# Patient Record
Sex: Male | Born: 1960 | Race: White | Hispanic: No | Smoking: Current every day smoker
Health system: Southern US, Community
[De-identification: ages and names within clinical notes are randomized; demographics above are authoritative.]

## PROBLEM LIST (undated history)

## (undated) DIAGNOSIS — I10 Essential (primary) hypertension: Secondary | ICD-10-CM

## (undated) DIAGNOSIS — M109 Gout, unspecified: Secondary | ICD-10-CM

## (undated) DIAGNOSIS — K219 Gastro-esophageal reflux disease without esophagitis: Secondary | ICD-10-CM

## (undated) DIAGNOSIS — E785 Hyperlipidemia, unspecified: Secondary | ICD-10-CM

## (undated) DIAGNOSIS — IMO0002 Reserved for concepts with insufficient information to code with codable children: Secondary | ICD-10-CM

## (undated) DIAGNOSIS — M1990 Unspecified osteoarthritis, unspecified site: Secondary | ICD-10-CM

## (undated) HISTORY — DX: Hyperlipidemia, unspecified: E78.5

## (undated) HISTORY — PX: ANTERIOR CRUCIATE LIGAMENT REPAIR: SHX115

## (undated) HISTORY — PX: NECK SURGERY: SHX720

## (undated) HISTORY — DX: Gastro-esophageal reflux disease without esophagitis: K21.9

---

## 1997-08-01 ENCOUNTER — Encounter: Admission: RE | Admit: 1997-08-01 | Discharge: 1997-10-30 | Payer: Self-pay | Admitting: Specialist

## 2000-08-26 ENCOUNTER — Inpatient Hospital Stay (HOSPITAL_COMMUNITY): Admission: EM | Admit: 2000-08-26 | Discharge: 2000-08-27 | Payer: Self-pay | Admitting: Emergency Medicine

## 2000-08-26 ENCOUNTER — Encounter: Payer: Self-pay | Admitting: Orthopedic Surgery

## 2003-06-09 ENCOUNTER — Ambulatory Visit (HOSPITAL_COMMUNITY): Admission: RE | Admit: 2003-06-09 | Discharge: 2003-06-09 | Payer: Self-pay | Admitting: Family Medicine

## 2005-06-11 ENCOUNTER — Encounter: Admission: RE | Admit: 2005-06-11 | Discharge: 2005-06-11 | Payer: Self-pay | Admitting: Orthopaedic Surgery

## 2005-06-14 ENCOUNTER — Ambulatory Visit (HOSPITAL_COMMUNITY): Admission: RE | Admit: 2005-06-14 | Discharge: 2005-06-15 | Payer: Self-pay | Admitting: Orthopaedic Surgery

## 2005-10-09 ENCOUNTER — Encounter: Admission: RE | Admit: 2005-10-09 | Discharge: 2005-10-09 | Payer: Self-pay | Admitting: Orthopaedic Surgery

## 2005-10-30 ENCOUNTER — Encounter: Admission: RE | Admit: 2005-10-30 | Discharge: 2005-10-30 | Payer: Self-pay | Admitting: Orthopaedic Surgery

## 2006-03-11 ENCOUNTER — Encounter: Admission: RE | Admit: 2006-03-11 | Discharge: 2006-03-11 | Payer: Self-pay | Admitting: Orthopaedic Surgery

## 2006-10-02 ENCOUNTER — Encounter: Admission: RE | Admit: 2006-10-02 | Discharge: 2006-10-02 | Payer: Self-pay | Admitting: Orthopaedic Surgery

## 2008-01-16 ENCOUNTER — Emergency Department (HOSPITAL_COMMUNITY): Admission: EM | Admit: 2008-01-16 | Discharge: 2008-01-16 | Payer: Self-pay | Admitting: Emergency Medicine

## 2008-05-20 ENCOUNTER — Ambulatory Visit (HOSPITAL_COMMUNITY): Admission: RE | Admit: 2008-05-20 | Discharge: 2008-05-20 | Payer: Self-pay | Admitting: Family Medicine

## 2008-11-17 ENCOUNTER — Emergency Department (HOSPITAL_COMMUNITY): Admission: EM | Admit: 2008-11-17 | Discharge: 2008-11-18 | Payer: Self-pay | Admitting: Emergency Medicine

## 2010-04-11 LAB — DIFFERENTIAL
Basophils Absolute: 0.2 10*3/uL — ABNORMAL HIGH (ref 0.0–0.1)
Eosinophils Absolute: 0.2 10*3/uL (ref 0.0–0.7)
Eosinophils Relative: 3 % (ref 0–5)
Lymphocytes Relative: 30 % (ref 12–46)
Lymphs Abs: 1.9 10*3/uL (ref 0.7–4.0)
Neutro Abs: 3.5 10*3/uL (ref 1.7–7.7)
Neutrophils Relative %: 56 % (ref 43–77)

## 2010-04-11 LAB — CBC
Hemoglobin: 14.5 g/dL (ref 13.0–17.0)
MCV: 93 fL (ref 78.0–100.0)
RBC: 4.56 MIL/uL (ref 4.22–5.81)

## 2010-04-19 ENCOUNTER — Emergency Department (HOSPITAL_COMMUNITY)
Admission: EM | Admit: 2010-04-19 | Discharge: 2010-04-19 | Disposition: A | Discharge status: Home / Self Care | Payer: Self-pay | Attending: Emergency Medicine | Admitting: Emergency Medicine

## 2010-04-19 DIAGNOSIS — M069 Rheumatoid arthritis, unspecified: Secondary | ICD-10-CM | POA: Insufficient documentation

## 2010-04-19 DIAGNOSIS — IMO0002 Reserved for concepts with insufficient information to code with codable children: Secondary | ICD-10-CM | POA: Insufficient documentation

## 2010-04-19 DIAGNOSIS — G510 Bell's palsy: Secondary | ICD-10-CM | POA: Insufficient documentation

## 2010-05-25 NOTE — Op Note (Signed)
NAME:  Dustin [name], Dustin [name]              ACCOUNT NO.:  [id number]   MEDICAL RECORD NO.:  [id number]          PATIENT TYPE:  OIB   LOCATION:  2550                         FACILITY:  [facility]   PHYSICIAN:  Mark C. Ophelia Charter, M.D.    DATE OF BIRTH:  13-Jul-1960   DATE OF PROCEDURE:  06/14/2005  DATE OF DISCHARGE:                                 OPERATIVE REPORT   PREOPERATIVE DIAGNOSIS:  Cervical spondylosis, C5-6 and C6-7, with disk  protrusion at C4-5, left.   POSTOPERATIVE DIAGNOSIS:  Cervical spondylosis, C5-6 and C6-7, with disk  protrusion at C4-5, left.   PROCEDURE:  C6 corpectomy, C4-5, C5-6, C6-7 anterior cervical diskectomy and  fusion, fibular allograft from C5 to C7 and allograft plug at C4-5, plating.   SURGEON:  Mark C. Ophelia Charter, M.D.   ASSISTANT:  Donnie Aho, P.A.-C   ANESTHESIA:  GOT plus 5.5 mL of Marcaine 0.5% plus epinephrine local.   DRAINS:  One Hemovac.   BRIEF HISTORY:  This 50 year old male has been followed for several years  with progressive spondylosis at C5-6 and C6-7.  He has been taking Vicodin  due to pain and had disk protrusion at C4-5, taken preoperatively for our  planned procedure.  Since the disk had protruded further at C4-5,  particularly on the left, which is the symptomatic side, it was decided to  include that in the fusion for a 3-level diskectomy.  Corpectomy was planned  at C6 to have 4 levels of bone healing rather than 6 sides with typical 3-  level fusion.   DESCRIPTION OF PROCEDURE:  After induction of general anesthesia and oral  tracheal intubation an incision was made after area was squared with towels,  Betadine, Vi-Drape, sterile mounts used at the head and thyroid sheet was  used.  Incision was started in the midline and extended to the left.  The  platysma was divided in line with the fibers.  The omohyoid was spared and  the bottom level of C6-7 was exposed first and confirmed with lateral C-arm  that was draped and brought in.   There was significant spondylosis with a  combination of bone ridging and disk protrusion at this level, worse on the  left than right and operative microscope was draped and brought in.  Microdiskectomy was performed, taking down the posterior aspect of the disk  with microdissection using 1- and 2-mm rongeurs.  Kerrisons were used.  Ligament was taken down when the dura was exposed and the C6 vertebral was  removed, taking a central trough the width of the __________  allograft,  which was 12 mm.  I continued removal of bone with bone waxing as necessary,  leaving a portion of the posterior cortex with the sides intact.  Once the  C5-6 disk was encountered, using the operating microscope, diskectomy was  performed, following back to the posterior longitudinal ligament, removing  the ligament on the right and left side, leaving the central portion of the  ligament intact.  Spurs were removed.  The uncovertebral joints were  stripped with the Cloward curettes and a high-speed bur was used  to square  off the area so it exactly fit.  A piece of fibula was cut with the flat  portion sitting anteriorly.  It had to be cut a couple of times, since it  was slightly long.  It was inserted with head halter traction applied by the  CRNA and impacted into position.  A tiny crack occurred anteriorly as it was  being countersunk slightly so it would be flush with the anterior cortex and  not hanging on the spur.  There was egress of bone on each side so that the  graft of the level of the disk for fluid and a fingertip had been placed  distally in the incision down toward the mediastinum.  The operative field  was dry and some FloSeal had been sprayed on the posterior aspect of the  vertebra.  DBX putty was packed in the ends of the graft.  Traction was  released once the graft was inserted.  Self-retaining retractors were then  placed above the omohyoid at the C4-5 level.  At this level there was   protrusion and the operating microscope was used as the posterior  longitudinal ligament was exposed.  Using a black nerve hook, a tiny piece  of disk was noted, grasped with a micropituitary and a fragment popped out  from the left side; it was sitting out laterally at the shoulder of the  nerve root.  After removal of bone, stripping the uncovertebral joints, dura  was well-exposed. Sizing showed a 7-mm graft was appropriate and it was  inserted with some DBX putty in the graft.  After irrigation with saline  solution, an appropriate-length EBI [employer] spine plate was selected.  This  had the locking ring.  The 14-mm screws were inserted after confirming  position with AP and lateral fluoroscopy.  Screws were placed in C4 and C5  and then distally in C7.  There was excellent fixation.  The graft at C4  lined up with the fibular allograft at C5-7.  Hemovac was placed through a  separate stab incision with in-and-out technique.  All screws were flush  with the plate.  Platysma was closed with 3-0 Vicryl, 4-0 Vicryl  subcuticular closure, tincture of Benzoin and Steri-Strips, soft collar  after dressing and Marcaine infiltration of the skin, and transferred to the  recovery room, neurologically intact.  Instrument count and needle count was  correct.   ADDENDUM:  Fibula was sized and depth-gauge-measured as well as width for  appropriate fine-tuning so that the size would fit snugly and securely.      Mark C. Ophelia Charter, M.D.  Electronically Signed     MCY/MEDQ  D:  06/14/2005  T:  06/15/2005  Job:  [id number]

## 2012-12-24 ENCOUNTER — Encounter (HOSPITAL_COMMUNITY): Payer: Self-pay | Admitting: Emergency Medicine

## 2012-12-24 ENCOUNTER — Emergency Department (INDEPENDENT_AMBULATORY_CARE_PROVIDER_SITE_OTHER)
Admission: EM | Admit: 2012-12-24 | Discharge: 2012-12-24 | Disposition: A | Discharge status: Home / Self Care | Payer: Self-pay | Source: Home / Self Care | Attending: Family Medicine | Admitting: Family Medicine

## 2012-12-24 DIAGNOSIS — I10 Essential (primary) hypertension: Secondary | ICD-10-CM

## 2012-12-24 HISTORY — DX: Essential (primary) hypertension: I10

## 2012-12-24 HISTORY — DX: Reserved for concepts with insufficient information to code with codable children: IMO0002

## 2012-12-24 HISTORY — DX: Gout, unspecified: M10.9

## 2012-12-24 HISTORY — DX: Unspecified osteoarthritis, unspecified site: M19.90

## 2012-12-24 MED ORDER — ATENOLOL 50 MG PO TABS: 50.0000 mg | ORAL_TABLET | Freq: Every day | ORAL | Status: DC

## 2012-12-24 NOTE — ED Notes (Signed)
Patient reports he needs a refill of blood pressure medicine.  Last on medication July 2014.  Reports today he is working with Louie Boston at social services to get orange card.  Patient has been seen at [facility] in November.  Patient no longer able to go to pcp-primary pcp left practice and copay has increased

## 2012-12-24 NOTE — ED Provider Notes (Signed)
Dustin [name] is a 52 y.o. male who presents to Urgent Care today for hypertension medication refill. Patient is without health insurance and physician. He previously had hypertension that was well-controlled with atenolol 50 mg daily. He is currently in the process of being [facility] on his blood pressure medicines now. He denies any chest pains palpitations dizziness or trouble breathing.  Additionally patient has chronic neck pain he recently managed with hydrocodone and diazepam. No new issues   Past Medical History  Diagnosis Date  . Hypertension   . Arthritis   . Gout   . DDD (degenerative disc disease)    History  Substance Use Topics  . Smoking status: Current Every Day Smoker  . Smokeless tobacco: Not on file  . Alcohol Use: Yes   ROS as above Medications reviewed. No current facility-administered medications for this encounter.   Current Outpatient Prescriptions  Medication Sig Dispense Refill  . atenolol (TENORMIN) 50 MG tablet Take 1 tablet (50 mg total) by mouth daily.  30 tablet  1    Exam:  BP 155/80  Pulse 79  Temp(Src) 98.3 F (36.8 C) (Oral)  Resp 16  SpO2 99% Gen: Well NAD HEENT:  MMM Lungs: Normal work of breathing. CTABL Heart: RRR no MRG Exts: Non edematous BL  LE, warm and well perfused.    Lab work from [place] emergency room one month ago shows creatinine 1.1 potassium normal.  Assessment and Plan: 52 y.o. male with hypertension. Plan refill atenolol. Followup at the [facility]. Discussed warning signs or symptoms. Please see discharge instructions. Patient expresses understanding.      Rodolph Bong, MD 12/24/12 1136

## 2013-01-28 ENCOUNTER — Encounter: Payer: Self-pay | Admitting: Internal Medicine

## 2013-01-28 ENCOUNTER — Ambulatory Visit: Payer: No Typology Code available for payment source | Attending: Internal Medicine | Admitting: Internal Medicine

## 2013-01-28 VITALS — BP 150/111 | HR 97 | Temp 98.9°F | Resp 14 | Ht 74.0 in | Wt 263.4 lb

## 2013-01-28 DIAGNOSIS — M069 Rheumatoid arthritis, unspecified: Secondary | ICD-10-CM

## 2013-01-28 DIAGNOSIS — Z0000 Encounter for general adult medical examination without abnormal findings: Secondary | ICD-10-CM

## 2013-01-28 DIAGNOSIS — I10 Essential (primary) hypertension: Secondary | ICD-10-CM

## 2013-01-28 DIAGNOSIS — M542 Cervicalgia: Secondary | ICD-10-CM | POA: Insufficient documentation

## 2013-01-28 LAB — CBC WITH DIFFERENTIAL/PLATELET
BASOS ABS: 0.1 10*3/uL (ref 0.0–0.1)
Basophils Relative: 1 % (ref 0–1)
Eosinophils Absolute: 0.2 10*3/uL (ref 0.0–0.7)
Eosinophils Relative: 3 % (ref 0–5)
HEMATOCRIT: 46.3 % (ref 39.0–52.0)
Hemoglobin: 16.1 g/dL (ref 13.0–17.0)
Lymphocytes Relative: 29 % (ref 12–46)
Lymphs Abs: 2 10*3/uL (ref 0.7–4.0)
MCH: 30.9 pg (ref 26.0–34.0)
MCHC: 34.8 g/dL (ref 30.0–36.0)
MCV: 88.9 fL (ref 78.0–100.0)
MONO ABS: 0.7 10*3/uL (ref 0.1–1.0)
Monocytes Relative: 11 % (ref 3–12)
NEUTROS ABS: 4 10*3/uL (ref 1.7–7.7)
Neutrophils Relative %: 56 % (ref 43–77)
PLATELETS: 310 10*3/uL (ref 150–400)
RBC: 5.21 MIL/uL (ref 4.22–5.81)
RDW: 13 % (ref 11.5–15.5)
WBC: 6.9 10*3/uL (ref 4.0–10.5)

## 2013-01-28 LAB — COMPLETE METABOLIC PANEL WITH GFR
ALT: 29 U/L (ref 0–53)
AST: 21 U/L (ref 0–37)
Albumin: 4.5 g/dL (ref 3.5–5.2)
Alkaline Phosphatase: 57 U/L (ref 39–117)
BILIRUBIN TOTAL: 0.4 mg/dL (ref 0.3–1.2)
BUN: 14 mg/dL (ref 6–23)
CHLORIDE: 101 meq/L (ref 96–112)
CO2: 22 meq/L (ref 19–32)
Calcium: 9.4 mg/dL (ref 8.4–10.5)
Creat: 1.11 mg/dL (ref 0.50–1.35)
GFR, EST AFRICAN AMERICAN: 89 mL/min
GFR, Est Non African American: 77 mL/min
GLUCOSE: 130 mg/dL — AB (ref 70–99)
POTASSIUM: 4.5 meq/L (ref 3.5–5.3)
Sodium: 133 mEq/L — ABNORMAL LOW (ref 135–145)
Total Protein: 6.9 g/dL (ref 6.0–8.3)

## 2013-01-28 LAB — TSH: TSH: 0.485 u[IU]/mL (ref 0.350–4.500)

## 2013-01-28 LAB — URIC ACID: URIC ACID, SERUM: 5.3 mg/dL (ref 4.0–7.8)

## 2013-01-28 LAB — LIPID PANEL
CHOL/HDL RATIO: 3.7 ratio
CHOLESTEROL: 136 mg/dL (ref 0–200)
HDL: 37 mg/dL — AB (ref >39)
LDL Cholesterol: 57 mg/dL (ref 0–99)
Triglycerides: 210 mg/dL — ABNORMAL HIGH (ref <150)
VLDL: 42 mg/dL — AB (ref 0–40)

## 2013-01-28 LAB — RHEUMATOID FACTOR: RHEUMATOID FACTOR: 11 [IU]/mL (ref <=14)

## 2013-01-28 LAB — POCT GLYCOSYLATED HEMOGLOBIN (HGB A1C): Hemoglobin A1C: 5.1

## 2013-01-28 MED ORDER — TRAMADOL HCL 50 MG PO TABS: 50.0000 mg | ORAL_TABLET | Freq: Three times a day (TID) | ORAL | Status: DC | PRN

## 2013-01-28 MED ORDER — CYCLOBENZAPRINE HCL 10 MG PO TABS: 10.0000 mg | ORAL_TABLET | Freq: Three times a day (TID) | ORAL | Status: DC | PRN

## 2013-01-28 MED ORDER — MELOXICAM 15 MG PO TABS: 15.0000 mg | ORAL_TABLET | Freq: Every day | ORAL | Status: DC

## 2013-01-28 MED ORDER — ATENOLOL 50 MG PO TABS: 50.0000 mg | ORAL_TABLET | Freq: Every day | ORAL | Status: DC

## 2013-01-28 MED ORDER — PREDNISONE 20 MG PO TABS: 20.0000 mg | ORAL_TABLET | Freq: Every day | ORAL | Status: AC

## 2013-01-28 NOTE — Progress Notes (Signed)
Patient ID: Dustin [name], male   DOB: Dec 30, 1960, 53 y.o.   MRN: [id number]   CC:  HPI: 53 year old male here to establish care, he has multiple complaints. He states that he had surgery on the C-spine, cervical spine fusion done by Dr. Lorin Mercy several years ago. He also has chronic lumbar spinal pain. He states that he had x-rays of the C-spine and plan the hospital. Because of his insurance the patient was unable to reestablish with Dr. Lorin Mercy and would like a referral to a neurosurgeon or an orthopedic surgeon. Currently he takes Aleve and ibuprofen on a daily basis He states that his blood pressure is well controlled on atenolol.  He complains of a rash that originates from the left side of his face and disappears after applying hydrocortisone cream He also complains of multiple joint pains and needs to be screened for rheumatoid arthritis as his mother has rheumatoid arthritis He has a personal history of ulcerated colitis and had a colonoscopy in 2011 by etiology I have had 2 polyps removed. He was told that he supposed to have a colonoscopy and an annual basis   Social history Smokes half a pack a day and alcohol socially  Family history positive for lung cancer, lymph node cancer in the father Mother has rheumatoid arthritis  No Known Allergies Past Medical History  Diagnosis Date  . Hypertension   . Arthritis   . Gout   . DDD (degenerative disc disease)    No current outpatient prescriptions on file prior to visit.   No current facility-administered medications on file prior to visit.   No family history on file. History   Social History  . Marital Status: Legally Separated    Spouse Name: N/A    Number of Children: N/A  . Years of Education: N/A   Occupational History  . Not on file.   Social History Main Topics  . Smoking status: Current Every Day Smoker  . Smokeless tobacco: Not on file  . Alcohol Use: Yes  . Drug Use: No  . Sexual Activity: Not on file    Other Topics Concern  . Not on file   Social History Narrative  . No narrative on file    Review of Systems  Constitutional: As in history of present illness  HENT: Negative for ear pain, nosebleeds, congestion, facial swelling, rhinorrhea, neck pain, neck stiffness and ear discharge.   Eyes: Negative for pain, discharge, redness, itching and visual disturbance.  Respiratory: Negative for cough, choking, chest tightness, shortness of breath, wheezing and stridor.   Cardiovascular: Negative for chest pain, palpitations and leg swelling.  Gastrointestinal: Negative for abdominal distention.  Genitourinary: Negative for dysuria, urgency, frequency, hematuria, flank pain, decreased urine volume, difficulty urinating and dyspareunia.  Musculoskeletal: As in history of present illness Neurological: Negative for dizziness, tremors, seizures, syncope, facial asymmetry, speech difficulty, weakness, light-headedness, numbness and headaches.  Hematological: Negative for adenopathy. Does not bruise/bleed easily.  Psychiatric/Behavioral: Negative for hallucinations, behavioral problems, confusion, dysphoric mood, decreased concentration and agitation.    Objective:   Filed Vitals:   01/28/13 1225  BP: 150/111  Pulse: 97  Temp: 98.9 F (37.2 C)  Resp: 14    Physical Exam  Constitutional: Appears well-developed and well-nourished. No distress.  HENT: Normocephalic. External right and left ear normal. Oropharynx is clear and moist.  Eyes: Conjunctivae and EOM are normal. PERRLA, no scleral icterus.  Neck: Normal ROM. Neck supple. No JVD. No tracheal deviation. No thyromegaly.  CVS:  RRR, S1/S2 +, no murmurs, no gallops, no carotid bruit.  Pulmonary: Effort and breath sounds normal, no stridor, rhonchi, wheezes, rales.  Abdominal: Soft. BS +,  no distension, tenderness, rebound or guarding.  Musculoskeletal: Normal range of motion. No edema and no tenderness.  Lymphadenopathy: No  lymphadenopathy noted, cervical, inguinal. Neuro: Alert. Normal reflexes, muscle tone coordination. No cranial nerve deficit. Skin: Skin is warm and dry. No rash noted. Not diaphoretic. No erythema. No pallor.  Psychiatric: Normal mood and affect. Behavior, judgment, thought content normal.   Lab Results  Component Value Date   WBC 6.4 11/17/2008   HGB 14.5 11/17/2008   HCT 42.4 11/17/2008   MCV 93.0 11/17/2008   PLT 251 11/17/2008   No results found for this basename: CREATININE, BUN, NA, K, CL, CO2    No results found for this basename: HGBA1C   Lipid Panel  No results found for this basename: chol, trig, hdl, cholhdl, vldl, ldlcalc       Assessment and plan:   There are no active problems to display for this patient.  Hypertension Continue atenolol Renal panel   Neck pain Patient will be prescribed prednisone, flexible, Mobic  , tramadol Referral provided for neurosurgery MRI of the C-spine Also screen for rheumatoid arthritis   Ulcerated colitis Refer to gastroenterology for a colonoscopy  Establish care Follow the above obtain baseline labs  Follow up in 2-3 months    The patient was given clear instructions to go to ER or return to medical center if symptoms don't improve, worsen or new problems develop. The patient verbalized understanding. The patient was told to call to get any lab results if not heard anything in the next week.

## 2013-01-28 NOTE — Progress Notes (Signed)
Pt is here to establish care. Has an history of hypertension.

## 2013-01-29 ENCOUNTER — Telehealth: Payer: Self-pay | Admitting: *Deleted

## 2013-01-29 LAB — SEDIMENTATION RATE: Sed Rate: 1 mm/hr (ref 0–16)

## 2013-01-29 LAB — ANA: ANA: NEGATIVE

## 2013-01-29 LAB — CYCLIC CITRUL PEPTIDE ANTIBODY, IGG: Cyclic Citrullin Peptide Ab: <2 U/mL (ref 0.0–5.0)

## 2013-01-29 LAB — VITAMIN D 25 HYDROXY (VIT D DEFICIENCY, FRACTURES): VIT D 25 HYDROXY: 32 ng/mL (ref 30–89)

## 2013-01-29 NOTE — Telephone Encounter (Signed)
Message copied by Delesha Pohlman, [place] R on Fri Jan 29, 2013  2:34 PM ------      Message from: Allyson Sabal MD, Ascencion Dike      Created: Fri Jan 29, 2013 10:19 AM       Notify patient of the labs are normal, triglyceride mildly elevated, encourage patient to go on a low-fat diet ------

## 2013-01-29 NOTE — Telephone Encounter (Signed)
Left a voicemail for pt to give us a call back. 

## 2013-02-02 ENCOUNTER — Telehealth: Payer: Self-pay | Admitting: *Deleted

## 2013-02-02 NOTE — Telephone Encounter (Signed)
Message copied by Luie Laneve, [place] R on Tue Feb 02, 2013 12:02 PM ------      Message from: Allyson Sabal MD, Ascencion Dike      Created: Mon Feb 01, 2013  2:07 PM       Workup for rheumatoid arthritis and other inflammatory arthritis is negative ------

## 2013-02-02 NOTE — Telephone Encounter (Signed)
Left a voicemail for pt to give us a call back. 

## 2013-02-10 ENCOUNTER — Ambulatory Visit (HOSPITAL_COMMUNITY)
Admission: RE | Admit: 2013-02-10 | Discharge: 2013-02-10 | Disposition: A | Discharge status: Home / Self Care | Payer: No Typology Code available for payment source | Source: Ambulatory Visit | Attending: Internal Medicine | Admitting: Internal Medicine

## 2013-02-10 DIAGNOSIS — I10 Essential (primary) hypertension: Secondary | ICD-10-CM

## 2013-02-10 DIAGNOSIS — M4802 Spinal stenosis, cervical region: Secondary | ICD-10-CM | POA: Insufficient documentation

## 2013-02-10 DIAGNOSIS — Z981 Arthrodesis status: Secondary | ICD-10-CM | POA: Insufficient documentation

## 2013-02-10 DIAGNOSIS — M069 Rheumatoid arthritis, unspecified: Secondary | ICD-10-CM

## 2013-02-10 DIAGNOSIS — Z0000 Encounter for general adult medical examination without abnormal findings: Secondary | ICD-10-CM

## 2013-02-10 DIAGNOSIS — M542 Cervicalgia: Secondary | ICD-10-CM | POA: Insufficient documentation

## 2013-04-06 ENCOUNTER — Ambulatory Visit: Payer: No Typology Code available for payment source | Attending: Internal Medicine | Admitting: Internal Medicine

## 2013-04-06 ENCOUNTER — Encounter: Payer: Self-pay | Admitting: Internal Medicine

## 2013-04-06 VITALS — BP 147/96 | HR 82 | Temp 99.1°F | Resp 16 | Ht 74.0 in | Wt 273.0 lb

## 2013-04-06 DIAGNOSIS — Z79899 Other long term (current) drug therapy: Secondary | ICD-10-CM | POA: Insufficient documentation

## 2013-04-06 DIAGNOSIS — R519 Headache, unspecified: Secondary | ICD-10-CM | POA: Insufficient documentation

## 2013-04-06 DIAGNOSIS — K029 Dental caries, unspecified: Secondary | ICD-10-CM | POA: Insufficient documentation

## 2013-04-06 DIAGNOSIS — IMO0002 Reserved for concepts with insufficient information to code with codable children: Secondary | ICD-10-CM | POA: Insufficient documentation

## 2013-04-06 DIAGNOSIS — F17200 Nicotine dependence, unspecified, uncomplicated: Secondary | ICD-10-CM | POA: Insufficient documentation

## 2013-04-06 DIAGNOSIS — R51 Headache: Secondary | ICD-10-CM

## 2013-04-06 DIAGNOSIS — I10 Essential (primary) hypertension: Secondary | ICD-10-CM

## 2013-04-06 DIAGNOSIS — M109 Gout, unspecified: Secondary | ICD-10-CM | POA: Insufficient documentation

## 2013-04-06 MED ORDER — ACETAMINOPHEN-CODEINE #3 300-30 MG PO TABS: 1.0000 | ORAL_TABLET | ORAL | Status: DC | PRN

## 2013-04-06 MED ORDER — PENICILLIN V POTASSIUM 500 MG PO TABS: 500.0000 mg | ORAL_TABLET | Freq: Three times a day (TID) | ORAL | Status: DC

## 2013-04-06 NOTE — Patient Instructions (Signed)
Hypertension  As your heart beats, it forces blood through your arteries. This force is your blood pressure. If the pressure is too high, it is called hypertension (HTN) or high blood pressure. HTN is dangerous because you may have it and not know it. High blood pressure may mean that your heart has to work harder to pump blood. Your arteries may be narrow or stiff. The extra work puts you at risk for heart disease, stroke, and other problems.   Blood pressure consists of two numbers, a higher number over a lower, 110/72, for example. It is stated as "110 over 72." The ideal is below 120 for the top number (systolic) and under 80 for the bottom (diastolic). Write down your blood pressure today.  You should pay close attention to your blood pressure if you have certain conditions such as:   Heart failure.   Prior heart attack.   Diabetes   Chronic kidney disease.   Prior stroke.   Multiple risk factors for heart disease.  To see if you have HTN, your blood pressure should be measured while you are seated with your arm held at the level of the heart. It should be measured at least twice. A one-time elevated blood pressure reading (especially in the Emergency Department) does not mean that you need treatment. There may be conditions in which the blood pressure is different between your right and left arms. It is important to see your caregiver soon for a recheck.  Most people have essential hypertension which means that there is not a specific cause. This type of high blood pressure may be lowered by changing lifestyle factors such as:   Stress.   Smoking.   Lack of exercise.   Excessive weight.   Drug/tobacco/alcohol use.   Eating less salt.  Most people do not have symptoms from high blood pressure until it has caused damage to the body. Effective treatment can often prevent, delay or reduce that damage.  TREATMENT    When a cause has been identified, treatment for high blood pressure is directed at the cause. There are a large number of medications to treat HTN. These fall into several categories, and your caregiver will help you select the medicines that are best for you. Medications may have side effects. You should review side effects with your caregiver.  If your blood pressure stays high after you have made lifestyle changes or started on medicines,    Your medication(s) may need to be changed.   Other problems may need to be addressed.   Be certain you understand your prescriptions, and know how and when to take your medicine.   Be sure to follow up with your caregiver within the time frame advised (usually within two weeks) to have your blood pressure rechecked and to review your medications.   If you are taking more than one medicine to lower your blood pressure, make sure you know how and at what times they should be taken. Taking two medicines at the same time can result in blood pressure that is too low.  SEEK IMMEDIATE MEDICAL CARE IF:   You develop a severe headache, blurred or changing vision, or confusion.   You have unusual weakness or numbness, or a faint feeling.   You have severe chest or abdominal pain, vomiting, or breathing problems.  MAKE SURE YOU:    Understand these instructions.   Will watch your condition.   Will get help right away if you are not doing well   or get worse.  Document Released: 12/24/2004 Document Revised: 03/18/2011 Document Reviewed: 08/14/2007  ExitCare Patient Information 2014 [employer]  DASH Diet   The DASH diet stands for "Dietary Approaches to Stop Hypertension." It is a healthy eating plan that has been shown to reduce high blood pressure (hypertension) in as little as 14 days, while also possibly providing other significant health benefits. These other health benefits include reducing the risk of breast cancer after menopause and reducing the risk of type 2 diabetes, heart disease, colon cancer, and stroke. Health benefits also include weight loss and slowing kidney failure in patients with chronic kidney disease.   DIET GUIDELINES   Limit salt (sodium). Your diet should contain less than 1500 mg of sodium daily.   Limit refined or processed carbohydrates. Your diet should include mostly whole grains. Desserts and added sugars should be used sparingly.   Include small amounts of heart-healthy fats. These types of fats include nuts, oils, and tub margarine. Limit saturated and trans fats. These fats have been shown to be harmful in the body.  CHOOSING FOODS   The following food groups are based on a 2000 calorie diet. See your Registered Dietitian for individual calorie needs.  Grains and Grain Products (6 to 8 servings daily)   Eat More Often: Whole-wheat bread, brown rice, whole-grain or wheat pasta, quinoa, popcorn without added fat or salt (air popped).   Eat Less Often: White bread, white pasta, white rice, cornbread.  Vegetables (4 to 5 servings daily)   Eat More Often: Fresh, frozen, and canned vegetables. Vegetables may be raw, steamed, roasted, or grilled with a minimal amount of fat.   Eat Less Often/Avoid: Creamed or fried vegetables. Vegetables in a cheese sauce.  Fruit (4 to 5 servings daily)   Eat More Often: All fresh, canned (in natural juice), or frozen fruits. Dried fruits without added sugar. One hundred percent fruit juice ( cup [237 mL] daily).   Eat Less Often: Dried fruits with added sugar. Canned fruit in light or heavy syrup.   Lean Meats, Fish, and Poultry (2 servings or less daily. One serving is 3 to 4 oz [85-114 g]).   Eat More Often: Ninety percent or leaner ground beef, tenderloin, sirloin. Round cuts of beef, chicken breast, turkey breast. All fish. Grill, bake, or broil your meat. Nothing should be fried.   Eat Less Often/Avoid: Fatty cuts of meat, turkey, or chicken leg, thigh, or wing. Fried cuts of meat or fish.  Dairy (2 to 3 servings)   Eat More Often: Low-fat or fat-free milk, low-fat plain or light yogurt, reduced-fat or part-skim cheese.   Eat Less Often/Avoid: Milk (whole, 2%).Whole milk yogurt. Full-fat cheeses.  Nuts, Seeds, and Legumes (4 to 5 servings per week)   Eat More Often: All without added salt.   Eat Less Often/Avoid: Salted nuts and seeds, canned beans with added salt.  Fats and Sweets (limited)   Eat More Often: Vegetable oils, tub margarines without trans fats, sugar-free gelatin. Mayonnaise and salad dressings.   Eat Less Often/Avoid: Coconut oils, palm oils, butter, stick margarine, cream, half and half, cookies, candy, pie.  FOR MORE INFORMATION  The Dash Diet Eating Plan: www.dashdiet.org  Document Released: 12/13/2010 Document Revised: 03/18/2011 Document Reviewed: 12/13/2010  [employer] Patient Information 2014 [employer]

## 2013-04-06 NOTE — Progress Notes (Signed)
Pt is here following up on his HTN and frequent migraines. Pt states that he has infection and abscess in some of his teeth.

## 2013-04-06 NOTE — Progress Notes (Signed)
Patient ID: GRIFFYN KUCINSKI, male   DOB: April 02, 1960, 53 y.o.   MRN: [id number]   Cuinn Westerhold, is a 53 y.o. male  YIR:[id number]  [id number]:[id number]  DOB - Apr 06, 1960  Chief Complaint  Patient presents with  . Follow-up        Subjective:   Maxen Rowland is a 53 y.o. male here today for a follow up visit. Patient has history of hypertension and degenerative disc disease, he also thinks he has rheumatoid arthritis even though tests have been negative. He is here today complaining of generalized body pain and joint pains with headache. He said he has migraine headache and has been taking lots of Excedrin with some relief. He complains of pains in his neck and back pain, he was recently referred to a neurosurgeon because of lack of insurance he was not able to establish care , he could not afford co-pay. He also has tooth aches and he thinks he may be having abscesses, he has a dental appointment coming up for tooth extraction but he will like to start antibiotics prior to tooth extraction as requested by the dentist. He smokes heavily about one pack of cigarette per day, he also drinks alcohol. He thinks his pain is responsible for his blood pressure today otherwise he said his blood pressure is controlled on atenolol 50 mg tablet by mouth daily.  Patient has No headache, No chest pain, No abdominal pain - No Nausea, No new weakness tingling or numbness, No Cough - SOB.  Problem  Essential Hypertension, Benign  Dental Caries  Headache(784.0)    ALLERGIES: No Known Allergies  PAST MEDICAL HISTORY: Past Medical History  Diagnosis Date  . Hypertension   . Arthritis   . Gout   . DDD (degenerative disc disease)     MEDICATIONS AT HOME: Prior to Admission medications   Medication Sig Start Date End Date Taking? Authorizing Provider  atenolol (TENORMIN) 50 MG tablet Take 1 tablet (50 mg total) by mouth daily. 01/28/13  Yes Reyne Dumas, MD  cyclobenzaprine (FLEXERIL) 10 MG tablet Take 1  tablet (10 mg total) by mouth 3 (three) times daily as needed for muscle spasms. 01/28/13  Yes Reyne Dumas, MD  meloxicam (MOBIC) 15 MG tablet Take 1 tablet (15 mg total) by mouth daily. 01/28/13  Yes Reyne Dumas, MD  acetaminophen-codeine (TYLENOL #3) 300-30 MG per tablet Take 1 tablet by mouth every 4 (four) hours as needed. 04/06/13   Angelica Chessman, MD  penicillin v potassium (VEETID) 500 MG tablet Take 1 tablet (500 mg total) by mouth 3 (three) times daily. 04/06/13   Angelica Chessman, MD  traMADol (ULTRAM) 50 MG tablet Take 1 tablet (50 mg total) by mouth every 8 (eight) hours as needed. 01/28/13   Reyne Dumas, MD     Objective:   Filed Vitals:   04/06/13 0927  BP: 147/96  Pulse: 82  Temp: 99.1 F (37.3 C)  TempSrc: Oral  Resp: 16  Height: 6\' 2"  (1.88 m)  Weight: 273 lb (123.832 kg)  SpO2: 97%    Exam General appearance : Awake, alert, not in any distress. Speech Clear. Not toxic looking, extremely verbose HEENT: Atraumatic and Normocephalic, pupils equally reactive to light and accomodation Neck: supple, no JVD. No cervical lymphadenopathy.  Chest:Good air entry bilaterally, no added sounds  CVS: S1 S2 regular, no murmurs.  Abdomen: Bowel sounds present, Non tender and not distended with no gaurding, rigidity or rebound. Extremities: B/L Lower Ext shows no edema, both legs are  warm to touch Neurology: Awake alert, and oriented X 3, CN II-XII intact, Non focal Skin:No Rash Wounds:N/A  Data Review Lab Results  Component Value Date   HGBA1C 5.1 01/28/2013     Assessment & Plan   1. Essential hypertension, benign Continue atenolol 50 mg tablet by mouth daily DASH diet Patient was counseled extensively on nutrition and exercise  2. Dental caries  - acetaminophen-codeine (TYLENOL #3) 300-30 MG per tablet; Take 1 tablet by mouth every 4 (four) hours as needed.  Dispense: 60 tablet; Refill: 0 - penicillin v potassium (VEETID) 500 MG tablet; Take 1 tablet (500 mg  total) by mouth 3 (three) times daily.  Dispense: 30 tablet; Refill: 0  3. Headache(784.0)  - acetaminophen-codeine (TYLENOL #3) 300-30 MG per tablet; Take 1 tablet by mouth every 4 (four) hours as needed.  Dispense: 60 tablet; Refill: 0  - Ambulatory referral to Neurology   Patient was asked extensively counseled on smoking cessation   Return in about 6 months (around 10/06/2013), or if symptoms worsen or fail to improve, for Follow up HTN.  The patient was given clear instructions to go to ER or return to medical center if symptoms don't improve, worsen or new problems develop. The patient verbalized understanding. The patient was told to call to get lab results if they haven't heard anything in the next week.   This note has been created with Surveyor, quantity. Any transcriptional errors are unintentional.    Angelica Chessman, MD, [place], [place], [place] and [facility], [place]   04/06/2013, 10:08 AM

## 2013-04-12 ENCOUNTER — Encounter: Payer: Self-pay | Admitting: Internal Medicine

## 2013-04-29 ENCOUNTER — Ambulatory Visit: Payer: No Typology Code available for payment source | Admitting: Internal Medicine

## 2013-05-06 ENCOUNTER — Encounter: Payer: Self-pay | Admitting: Neurology

## 2013-05-06 ENCOUNTER — Ambulatory Visit (INDEPENDENT_AMBULATORY_CARE_PROVIDER_SITE_OTHER): Payer: No Typology Code available for payment source | Admitting: Neurology

## 2013-05-06 ENCOUNTER — Encounter: Payer: Self-pay | Admitting: *Deleted

## 2013-05-06 VITALS — BP 136/88 | HR 96 | Resp 18 | Ht 74.0 in | Wt 274.0 lb

## 2013-05-06 DIAGNOSIS — G4440 Drug-induced headache, not elsewhere classified, not intractable: Secondary | ICD-10-CM

## 2013-05-06 MED ORDER — AMITRIPTYLINE HCL 25 MG PO TABS
25.0000 mg | ORAL_TABLET | Freq: Every day | ORAL | Status: DC
Start: 2013-05-06 — End: 2013-07-27

## 2013-05-06 NOTE — Patient Instructions (Signed)
I think that the overuse of pain medications is contributing to daily headaches. 1.  Do not take any pain reliever more than 2 days out of the week.  Take NSAIDs such as ibuprofen or naproxen.  Due not take narcotics such as Tylenol 3 or Vicodin 2.  We will start an antidepressant called amitriptyline.  This is often used to reduce frequency of headaches.  We will start 25mg  at bedtime.  Side effects may include sleepiness, dizziness or dry mouth. 3.  We will get CT results from the hospital 4.  Stop use of caffeine.  No alcohol.  Work on stopping smoking. 5.  Call in 4 weeks with update.  Follow up in 3 months.  Note that headaches may get worse before they get better and it may take time to achieve some positive results so hang in there with me.

## 2013-05-06 NOTE — Progress Notes (Signed)
NEUROLOGY CONSULTATION NOTE  Dustin [name] MRN: [id number] DOB: May 11, 1960  Referring provider: Dr. Doreene Burke Primary care provider: Dr. Doreene Burke  Reason for consult:  Headache  HISTORY OF PRESENT ILLNESS: Dustin [name] is a 53 year old right-handed man with history of hypertension, arthritis, degenerative disc disease, current tobacco smoker, and gout who presents for headache.  Records and images were personally reviewed where available.    Onset:  June 2014 Location:  Variable.  Holocephalic, bilateral jaw and teeth pain that can radiate up to the face and head, posterior head pain radiating up to front of head Quality:  Holocephalic clamping pain, occasional throbbing, stabbing pain in the maxilla and bilateral retro-orbital regions. Intensity:  Anywhere from 5-10/10 (mostly 10/10) Aura:  no Prodrome:  no Associated symptoms:  When fluctuates, notes blurred vision and sees spots.  Photophobia.  No nausea, phonophobia, autonomic symptoms or osmophobia Duration:  constant Frequency:  constant Triggers/exacerbating factors:  Fluorescent light.  Neck pain alone doesn't trigger it, except if he turns his neck and it "pops". Relieving factors:  Vicodin (only dulls it briefly) Activity:  Able to force self to function  Past abortive therapy:  Vicodin (Took daily.  It helped dull pain) Past preventative therapy:  none  Current abortive therapy:  Excedrin, Tylenol #3, ibuprofen (takes any of these daily.  Briefly dulls pain).  Takes flexeril 5mg  at bedtime for neck pain (can't take during day because makes him sleepy). Current preventative therapy:  Atenolol 50mg  (takes for HTN)  Caffeine:  Coffee daily.  1-2 liters Pepsi per week Depression/stress:  Stress related to the headache and caring for sick mother. Sleep hygiene:  Only sleeps 2 hours at a time.  Not rested. Family history of headache:  No family history of headache. Family history of other neurological conditions:   Paternal grandfather had ALS  He has history of neck and back pain as well.  He had past history of C4-C7 fusion.  MRI of cervical spine from 02/12/13 revealed "long segment C4 through C7 fusion with adjacent segment disease at  C3-C4. C3-C4 mild central stenosis and bilateral lateral recess stenosis. 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 Arneta Cliche at [facility], who told him that there is nothing surgical to be done.  He also had been experiencing tooth pain.  He is scheduled to see a dentist in June.  He has been prescribed antibiotics for possible dental caries.  He has reading glasses, but has not had an eye exam for quite some time.  He went to an outside ED in October 2014 because of the headache.  BP was elevated at that time (reportedly systolic 237S).  A head CT was performed and reportedly unremarkable.  PAST MEDICAL HISTORY: Past Medical History  Diagnosis Date  . Hypertension   . Arthritis   . Gout   . DDD (degenerative disc disease)     PAST SURGICAL HISTORY: Past Surgical History  Procedure Laterality Date  . Neck surgery    . Anterior cruciate ligament repair      MEDICATIONS: Current Outpatient Prescriptions on File Prior to Visit  Medication Sig Dispense Refill  . atenolol (TENORMIN) 50 MG tablet Take 1 tablet (50 mg total) by mouth daily.  30 tablet  6  . cyclobenzaprine (FLEXERIL) 10 MG tablet Take 1 tablet (10 mg total) by mouth 3 (three) times daily as needed for muscle spasms.  45 tablet  0  No current facility-administered medications on file prior to visit.    ALLERGIES: No Known Allergies  FAMILY HISTORY: Family History  Problem Relation Age of Onset  . Cancer Father   . Diabetes Father   . Rheum arthritis Mother     SOCIAL HISTORY: History   Social History  . Marital Status: Legally Separated    Spouse Name: N/A    Number of Children: N/A  . Years of Education:  N/A   Occupational History  . Not on file.   Social History Main Topics  . Smoking status: Current Every Day Smoker -- 0.50 packs/day    Types: Cigarettes  . Smokeless tobacco: Not on file  . Alcohol Use: Yes     Comment: occasionally- "3 shots a month"  . Drug Use: No  . Sexual Activity: Not on file   Other Topics Concern  . Not on file   Social History Narrative  . No narrative on file    REVIEW OF SYSTEMS: Constitutional: No fevers, chills, or sweats, no generalized fatigue, change in appetite Eyes: No visual changes, double vision, eye pain Ear, nose and throat: No hearing loss, ear pain, nasal congestion, sore throat Cardiovascular: No chest pain, palpitations Respiratory:  No shortness of breath at rest or with exertion, wheezes GastrointestinaI: No nausea, vomiting, diarrhea, abdominal pain, fecal incontinence Genitourinary:  No dysuria, urinary retention or frequency Musculoskeletal:  As above Integumentary: No rash, pruritus, skin lesions Neurological: as above Psychiatric: No depression, insomnia, anxiety Endocrine: No palpitations, fatigue, diaphoresis, mood swings, change in appetite, change in weight, increased thirst Hematologic/Lymphatic:  No anemia, purpura, petechiae. Allergic/Immunologic: no itchy/runny eyes, nasal congestion, recent allergic reactions, rashes  PHYSICAL EXAM: Filed Vitals:   05/06/13 1026  BP: 136/88  Pulse: 96  Resp: 18   General: No acute distress Head:  Normocephalic/atraumatic.  Tenderness to palpation of the face, head, and teeth.  Suboccipital tenderness. Neck: supple, bilateral tenderness, full range of motion Back: No paraspinal tenderness Heart: regular rate and rhythm Lungs: Clear to auscultation bilaterally. Vascular: No carotid bruits. Neurological Exam: Mental status: alert and oriented to person, place, and time, recent and remote memory intact, fund of knowledge intact, attention and concentration intact, speech  fluent and not dysarthric, language intact. Cranial nerves: CN I: not tested CN II: pupils equal, round and reactive to light, visual fields intact, fundi unremarkable, without vessel changes, exudates, hemorrhages or papilledema. CN III, IV, VI:  full range of motion, no nystagmus, no ptosis CN V: facial sensation intact CN VII: upper and lower face symmetric CN VIII: hearing intact CN IX, X: gag intact, uvula midline CN XI: sternocleidomastoid and trapezius muscles intact CN XII: tongue midline Bulk & Tone: normal, no fasciculations. Motor: 5/5 throughout Sensation: temperature and vibration intact. Deep Tendon Reflexes: 2+ throughout, toes down Finger to nose testing: no dysmetria Heel to shin: no dymetria Gait: normal station and stride.  Able to turn and walk in tandem. Romberg negative.  IMPRESSION: Medication-overuse headache.  The lack of focal symptoms, history of signs and time frame do not suggest a worrisome etiology.  He reportedly already had imaging of the head.  PLAN: 1.  Amitriptyline 25mg  at bedtime.  Side effects discussed. 2.  Stop narcotics and only take NSAIDs.  Limit use of pain relievers to no more than 2 days out of the week. 3.  Follow up with dentist and should follow up for eye exam. 4.  Discussed that headaches may get worse before they get better and  that it may take some time before notice improvement. 5.  Follow up in 3 months.  Thank you for allowing me to take part in the care of this patient.  Metta Clines, DO  CC: Angelica Chessman, MD

## 2013-05-10 ENCOUNTER — Telehealth: Payer: Self-pay | Admitting: Neurology

## 2013-05-10 ENCOUNTER — Telehealth: Payer: Self-pay | Admitting: *Deleted

## 2013-05-10 ENCOUNTER — Other Ambulatory Visit: Payer: Self-pay | Admitting: *Deleted

## 2013-05-10 DIAGNOSIS — R51 Headache: Secondary | ICD-10-CM

## 2013-05-10 NOTE — Telephone Encounter (Signed)
Please call pt at [phone]

## 2013-05-10 NOTE — Telephone Encounter (Signed)
Pt called returning your call regarding scheduling a MRI.

## 2013-05-10 NOTE — Telephone Encounter (Signed)
I called and left message for patient to call office back I need to make an appt for MRI brain  For him

## 2013-05-10 NOTE — Telephone Encounter (Signed)
Patient is scheduled for MRI brain [id number] at [facility] on 06/01/13 at 1:45 I called and left patient message as patient requested

## 2013-05-10 NOTE — Telephone Encounter (Signed)
I returned his call no answer .

## 2013-05-13 ENCOUNTER — Other Ambulatory Visit: Payer: Self-pay | Admitting: Internal Medicine

## 2013-06-01 ENCOUNTER — Telehealth: Payer: Self-pay | Admitting: *Deleted

## 2013-06-01 ENCOUNTER — Ambulatory Visit (HOSPITAL_COMMUNITY)
Admission: RE | Admit: 2013-06-01 | Discharge: 2013-06-01 | Disposition: A | Discharge status: Home / Self Care | Payer: No Typology Code available for payment source | Source: Ambulatory Visit | Attending: Neurology | Admitting: Neurology

## 2013-06-01 DIAGNOSIS — R51 Headache: Secondary | ICD-10-CM

## 2013-06-01 DIAGNOSIS — I10 Essential (primary) hypertension: Secondary | ICD-10-CM | POA: Insufficient documentation

## 2013-06-01 LAB — CREATININE, SERUM
Creatinine, Ser: 1.19 mg/dL (ref 0.50–1.35)
GFR calc Af Amer: 81 mL/min — ABNORMAL LOW (ref >90)
GFR calc non Af Amer: 70 mL/min — ABNORMAL LOW (ref >90)

## 2013-06-01 MED ORDER — GADOBENATE DIMEGLUMINE 529 MG/ML IV SOLN
20.0000 mL | Freq: Once | INTRAVENOUS | Status: AC
  Administered 2013-06-01: 20 mL via INTRAVENOUS

## 2013-06-01 NOTE — Telephone Encounter (Signed)
Patient called stating he has just finished his MRI of the brain with contrast and has a severe headache . He ask if the contrast could cause a headache or was it maybe the stress from having the MRI . I told him it could be a combination of both. He said the headache was really bad he did not have any ibuprofen to take . He said he did not feel like he could leave the parking lot . I  Advised him if it was that bad since he was already at the hospital and felt he could not drive  It might be best if he went to the ED to have it checked on he ask [place] to call when the results of MRI were back

## 2013-06-02 ENCOUNTER — Telehealth: Payer: Self-pay | Admitting: *Deleted

## 2013-06-02 NOTE — Telephone Encounter (Signed)
Message copied by Claudie Revering on Wed Jun 02, 2013  1:55 PM ------      Message from: JAFFE, ADAM R      Created: Wed Jun 02, 2013  7:13 AM       MRI of the brain is unremarkable.  There is very mild tiny spots on the brain, which can be seen in people with history of hypertension.      ----- Message -----         From: Rad Results In Interface         Sent: 06/01/2013   4:56 PM           To: Dudley Major, DO                   ------

## 2013-06-02 NOTE — Telephone Encounter (Signed)
Patient is aware of  MRI  Results

## 2013-06-09 ENCOUNTER — Telehealth: Payer: Self-pay | Admitting: Neurology

## 2013-06-09 NOTE — Telephone Encounter (Signed)
Pt called requesting a refill for amitriptyline 25mg   C/B [phone] Call it to [facility]

## 2013-06-11 ENCOUNTER — Telehealth: Payer: Self-pay | Admitting: *Deleted

## 2013-06-11 NOTE — Telephone Encounter (Signed)
Elavil 25 mg #30 1 PO @ HS called to  Pharmacy  No refills

## 2013-07-02 ENCOUNTER — Telehealth: Payer: Self-pay | Admitting: Emergency Medicine

## 2013-07-02 ENCOUNTER — Telehealth: Payer: Self-pay | Admitting: Internal Medicine

## 2013-07-02 NOTE — Telephone Encounter (Signed)
Pt not feeling well, would like to see physician but next available appt is on 07/26/13. Please f/u with pt.

## 2013-07-05 NOTE — Telephone Encounter (Signed)
Pt called in with c/o frequent elevated blood pressure with headaches. States he is taking prescribed meds daily BP at home 150's/100 Scheduled nurse visit 07/06/13

## 2013-07-06 ENCOUNTER — Other Ambulatory Visit: Payer: Self-pay | Admitting: Internal Medicine

## 2013-07-06 ENCOUNTER — Ambulatory Visit: Payer: No Typology Code available for payment source | Attending: Internal Medicine

## 2013-07-06 MED ORDER — PENICILLIN V POTASSIUM 500 MG PO TABS: 500.0000 mg | ORAL_TABLET | Freq: Four times a day (QID) | ORAL | Status: DC

## 2013-07-06 NOTE — Progress Notes (Unsigned)
Patient ID: Dustin [name], male   DOB: 1960-10-11, 53 y.o.   MRN: [id number] Pt comes in for blood pressure recheck s/p elevated home readings Pt has a wrist BP cuff and states reading have been running high in 170's/100's C/o frequent migraine headaches with taking Elavil

## 2013-07-06 NOTE — Patient Instructions (Signed)
Keep taking prescribed blood pressure medication as prescribed  Take antibiotic as ordered Return in 2 weeks for another BP recheck

## 2013-07-26 ENCOUNTER — Ambulatory Visit: Payer: No Typology Code available for payment source | Attending: Internal Medicine | Admitting: Internal Medicine

## 2013-07-26 ENCOUNTER — Encounter: Payer: Self-pay | Admitting: Internal Medicine

## 2013-07-26 ENCOUNTER — Other Ambulatory Visit: Payer: Self-pay | Admitting: Internal Medicine

## 2013-07-26 VITALS — BP 140/98 | HR 84 | Temp 98.9°F | Resp 16 | Ht 74.0 in | Wt 263.0 lb

## 2013-07-26 DIAGNOSIS — Z791 Long term (current) use of non-steroidal anti-inflammatories (NSAID): Secondary | ICD-10-CM | POA: Insufficient documentation

## 2013-07-26 DIAGNOSIS — F17200 Nicotine dependence, unspecified, uncomplicated: Secondary | ICD-10-CM | POA: Insufficient documentation

## 2013-07-26 DIAGNOSIS — I10 Essential (primary) hypertension: Secondary | ICD-10-CM | POA: Insufficient documentation

## 2013-07-26 DIAGNOSIS — R51 Headache: Secondary | ICD-10-CM

## 2013-07-26 MED ORDER — ATENOLOL 50 MG PO TABS: 50.0000 mg | ORAL_TABLET | Freq: Every day | ORAL | Status: DC

## 2013-07-26 MED ORDER — ACETAMINOPHEN-CODEINE #3 300-30 MG PO TABS: 1.0000 | ORAL_TABLET | ORAL | Status: DC | PRN

## 2013-07-26 NOTE — Progress Notes (Signed)
Patient ID: Dustin [name], male   DOB: 14-Sep-1960, 53 y.o.   MRN: [id number]  CC: headaches   HPI: 53 year old male with past medical history of arthritis, hypertension, chronic headache, takes alternating ibuprofen and Excedrin for headaches. He reports having migraine type of headaches. He does not have a headache at this time. Note changes in vision. No lightheadedness. No gait imbalance.  No Known Allergies Past Medical History  Diagnosis Date  . Hypertension   . Arthritis   . Gout   . DDD (degenerative disc disease)    Current Outpatient Prescriptions on File Prior to Visit  Medication Sig Dispense Refill  . amitriptyline (ELAVIL) 25 MG tablet Take 1 tablet (25 mg total) by mouth at bedtime.  30 tablet  0  . aspirin-acetaminophen-caffeine (EXCEDRIN MIGRAINE) 193-790-24 MG per tablet Take by mouth every 6 (six) hours as needed for headache.      Marland Kitchen acetaminophen (TYLENOL) 500 MG tablet Take 500 mg by mouth every 6 (six) hours as needed.      . cyclobenzaprine (FLEXERIL) 10 MG tablet Take 1 tablet (10 mg total) by mouth 3 (three) times daily as needed for muscle spasms.  45 tablet  0  . penicillin v potassium (VEETID) 500 MG tablet Take 1 tablet (500 mg total) by mouth 4 (four) times daily.  30 tablet  0   No current facility-administered medications on file prior to visit.   Family History  Problem Relation Age of Onset  . Cancer Father   . Diabetes Father   . Rheum arthritis Mother    History   Social History  . Marital Status: Legally Separated    Spouse Name: N/A    Number of Children: N/A  . Years of Education: N/A   Occupational History  . Not on file.   Social History Main Topics  . Smoking status: Current Every Day Smoker -- 0.50 packs/day    Types: Cigarettes  . Smokeless tobacco: Not on file  . Alcohol Use: Yes     Comment: occasionally- "3 shots a month"  . Drug Use: No  . Sexual Activity: Not on file   Other Topics Concern  . Not on file   Social  History Narrative  . No narrative on file    Review of Systems  Constitutional: Negative for fever, chills, diaphoresis, activity change, appetite change and fatigue.  HENT: Negative for ear pain, nosebleeds, congestion, facial swelling, rhinorrhea, neck pain, neck stiffness and ear discharge.   Eyes: Negative for pain, discharge, redness, itching and visual disturbance.  Respiratory: Negative for cough, choking, chest tightness, shortness of breath, wheezing and stridor.   Cardiovascular: Negative for chest pain, palpitations and leg swelling.  Gastrointestinal: Negative for abdominal distention.  Genitourinary: Negative for dysuria, urgency, frequency, hematuria, flank pain, decreased urine volume, difficulty urinating and dyspareunia.  Musculoskeletal: Negative for back pain, joint swelling, arthralgias and gait problem.  Neurological: Negative for dizziness, tremors, seizures, syncope Hematological: Negative for adenopathy. Does not bruise/bleed easily.  Psychiatric/Behavioral: Negative for hallucinations, behavioral problems, confusion, dysphoric mood, decreased concentration and agitation.    Objective:   Filed Vitals:   07/26/13 0951  BP: 140/98  Pulse: 84  Temp: 98.9 F (37.2 C)  Resp: 16    Physical Exam  Constitutional: Appears well-developed and well-nourished. No distress.  HENT: Normocephalic. External right and left ear normal. Oropharynx is clear and moist.  Eyes: Conjunctivae and EOM are normal. PERRLA, no scleral icterus.  Neck: Normal ROM. Neck supple. No JVD.  No tracheal deviation. No thyromegaly.  CVS: RRR, S1/S2 +, no murmurs, no gallops, no carotid bruit.  Pulmonary: Effort and breath sounds normal, no stridor, rhonchi, wheezes, rales.  Abdominal: Soft. BS +,  no distension, tenderness, rebound or guarding.  Musculoskeletal: Normal range of motion. No edema and no tenderness.  Lymphadenopathy: No lymphadenopathy noted, cervical, inguinal. Neuro: Alert.  Normal reflexes, muscle tone coordination. No cranial nerve deficit. Skin: Skin is warm and dry. No rash noted. Not diaphoretic. No erythema. No pallor.  Psychiatric: Normal mood and affect. Behavior, judgment, thought content normal.   Lab Results  Component Value Date   WBC 6.9 01/28/2013   HGB 16.1 01/28/2013   HCT 46.3 01/28/2013   MCV 88.9 01/28/2013   PLT 310 01/28/2013   Lab Results  Component Value Date   CREATININE 1.19 06/01/2013   BUN 14 01/28/2013   NA 133* 01/28/2013   K 4.5 01/28/2013   CL 101 01/28/2013   CO2 22 01/28/2013    Lab Results  Component Value Date   HGBA1C 5.1 01/28/2013   Lipid Panel     Component Value Date/Time   CHOL 136 01/28/2013 1219   TRIG 210* 01/28/2013 1219   HDL 37* 01/28/2013 1219   CHOLHDL 3.7 01/28/2013 1219   VLDL 42* 01/28/2013 1219   LDLCALC 57 01/28/2013 1219       Assessment and plan:   Patient Active Problem List   Diagnosis Date Noted  . Hypertension - We have discussed target BP range - I have advised pt to check BP regularly and to call us back if the numbers are higher than 140/90 - discussed the importance of compliance with medical therapy and diet  - continue atenolol 05/06/2013        And in and and and a and he is in a in a you in in a in a and and and and and and and and and and and and a and and a a and a a and and will and and and and and and and a

## 2013-07-26 NOTE — Patient Instructions (Signed)
Hypertension Hypertension, commonly called high blood pressure, is when the force of blood pumping through your arteries is too strong. Your arteries are the blood vessels that carry blood from your heart throughout your body. A blood pressure reading consists of a higher number over a lower number, such as 110/72. The higher number (systolic) is the pressure inside your arteries when your heart pumps. The lower number (diastolic) is the pressure inside your arteries when your heart relaxes. Ideally you want your blood pressure below 120/80. Hypertension forces your heart to work harder to pump blood. Your arteries may become narrow or stiff. Having hypertension puts you at risk for heart disease, stroke, and other problems.  RISK FACTORS Some risk factors for high blood pressure are controllable. Others are not.  Risk factors you cannot control include:   Race. You may be at higher risk if you are African American.  Age. Risk increases with age.  Gender. Men are at higher risk than women before age 45 years. After age 65, women are at higher risk than men. Risk factors you can control include:  Not getting enough exercise or physical activity.  Being overweight.  Getting too much fat, sugar, calories, or salt in your diet.  Drinking too much alcohol. SIGNS AND SYMPTOMS Hypertension does not usually cause signs or symptoms. Extremely high blood pressure (hypertensive crisis) may cause headache, anxiety, shortness of breath, and nosebleed. DIAGNOSIS  To check if you have hypertension, your health care provider will measure your blood pressure while you are seated, with your arm held at the level of your heart. It should be measured at least twice using the same arm. Certain conditions can cause a difference in blood pressure between your right and left arms. A blood pressure reading that is higher than normal on one occasion does not mean that you need treatment. If one blood pressure reading  is high, ask your health care provider about having it checked again. TREATMENT  Treating high blood pressure includes making lifestyle changes and possibly taking medication. Living a healthy lifestyle can help lower high blood pressure. You may need to change some of your habits. Lifestyle changes may include:  Following the DASH diet. This diet is high in fruits, vegetables, and whole grains. It is low in salt, red meat, and added sugars.  Getting at least 2 1/2 hours of brisk physical activity every week.  Losing weight if necessary.  Not smoking.  Limiting alcoholic beverages.  Learning ways to reduce stress. If lifestyle changes are not enough to get your blood pressure under control, your health care provider may prescribe medicine. You may need to take more than one. Work closely with your health care provider to understand the risks and benefits. HOME CARE INSTRUCTIONS  Have your blood pressure rechecked as directed by your health care provider.   Only take medicine as directed by your health care provider. Follow the directions carefully. Blood pressure medicines must be taken as prescribed. The medicine does not work as well when you skip doses. Skipping doses also puts you at risk for problems.   Do not smoke.   Monitor your blood pressure at home as directed by your health care provider. SEEK MEDICAL CARE IF:   You think you are having a reaction to medicines taken.  You have recurrent headaches or feel dizzy.  You have swelling in your ankles.  You have trouble with your vision. SEEK IMMEDIATE MEDICAL CARE IF:  You develop a severe headache or   confusion.  You have unusual weakness, numbness, or feel faint.  You have severe chest or abdominal pain.  You vomit repeatedly.  You have trouble breathing. MAKE SURE YOU:   Understand these instructions.  Will watch your condition.  Will get help right away if you are not doing well or get  worse. Document Released: 12/24/2004 Document Revised: 12/29/2012 Document Reviewed: 10/16/2012 [employer] Patient Information 2015 [employer]. This information is not intended to replace advice given to you by your health care provider. Make sure you discuss any questions you have with your health care provider.  

## 2013-07-26 NOTE — Progress Notes (Signed)
Pt is here following up on his chronic headaches. Pt is requesting to review his MRI results. Pt has abscessed teeth on his upper and lower left side.

## 2013-07-27 ENCOUNTER — Other Ambulatory Visit: Payer: Self-pay | Admitting: Family Medicine

## 2013-07-27 ENCOUNTER — Telehealth: Payer: Self-pay | Admitting: Neurology

## 2013-07-27 MED ORDER — AMITRIPTYLINE HCL 50 MG PO TABS: 50.0000 mg | ORAL_TABLET | Freq: Every day | ORAL | Status: DC

## 2013-07-27 NOTE — Telephone Encounter (Signed)
Spoke with patient and notified him of Dr. Georgie Chard advisement. New rx sent to patient's pharmacy.

## 2013-07-27 NOTE — Telephone Encounter (Signed)
He states that none of the methods of prescribed treatment have helped his headaches. He's stopped smoking, has lost some weight. Blood pressure has been down. He has a f/u appt with you on 7/30. He would like a refill on the Amitriptyline which does help him rest better at night. Wants to know if there is anything else that you would suggest he do.

## 2013-07-27 NOTE — Telephone Encounter (Signed)
I would increase amitriptyline to 50mg  at bedtime.

## 2013-07-27 NOTE — Telephone Encounter (Signed)
Pt is still having headaches and he is not getting any relief please call [phone]

## 2013-08-05 ENCOUNTER — Ambulatory Visit: Payer: Self-pay | Admitting: Neurology

## 2013-08-05 ENCOUNTER — Telehealth: Payer: Self-pay | Admitting: Neurology

## 2013-08-05 NOTE — Telephone Encounter (Signed)
Pt called in this morning at 8:27AM to cancel his f/u appt. He stated that he did not have his Self-pay co pay this morning and r/s until 08/30/13 at 8:45AM

## 2013-08-13 ENCOUNTER — Other Ambulatory Visit: Payer: Self-pay | Admitting: Internal Medicine

## 2013-08-16 ENCOUNTER — Other Ambulatory Visit: Payer: Self-pay | Admitting: Internal Medicine

## 2013-08-30 ENCOUNTER — Ambulatory Visit: Payer: No Typology Code available for payment source | Admitting: Neurology

## 2013-08-30 ENCOUNTER — Encounter: Payer: Self-pay | Admitting: *Deleted

## 2013-09-02 ENCOUNTER — Telehealth: Payer: Self-pay | Admitting: Neurology

## 2013-09-02 NOTE — Telephone Encounter (Signed)
Pt did not keep 08/30/13 appt with Dr. Tomi Likens. Appt was not r/s / Sherri S.

## 2013-09-16 ENCOUNTER — Telehealth: Payer: Self-pay | Admitting: Neurology

## 2013-09-16 ENCOUNTER — Other Ambulatory Visit: Payer: Self-pay | Admitting: *Deleted

## 2013-09-16 DIAGNOSIS — R51 Headache: Secondary | ICD-10-CM

## 2013-09-16 MED ORDER — AMITRIPTYLINE HCL 50 MG PO TABS: 50.0000 mg | ORAL_TABLET | Freq: Every day | ORAL | Status: DC

## 2013-09-16 NOTE — Telephone Encounter (Signed)
Pt needs a refill  Amitriptyline  Pt phone number [phone]

## 2013-09-16 NOTE — Telephone Encounter (Signed)
Patient has cancelled his last 2 appt please advise on refills .

## 2013-09-17 NOTE — Telephone Encounter (Signed)
Give him a month supply without refills.  He is scheduled to see me on 10/05/13.  If he does not follow-up, I would be unable to prescribe further refills.

## 2013-09-17 NOTE — Telephone Encounter (Signed)
amitriptyline (ELAVIL) 50 MG tablet 1 po at bedtime called to pharmacy 0 refills

## 2013-10-05 ENCOUNTER — Ambulatory Visit: Payer: No Typology Code available for payment source | Admitting: Neurology

## 2013-10-18 ENCOUNTER — Encounter: Payer: Self-pay | Admitting: Neurology

## 2013-10-18 ENCOUNTER — Ambulatory Visit (INDEPENDENT_AMBULATORY_CARE_PROVIDER_SITE_OTHER): Payer: Self-pay | Admitting: Neurology

## 2013-10-18 VITALS — BP 148/86 | HR 80 | Ht 74.0 in | Wt 265.0 lb

## 2013-10-18 DIAGNOSIS — G44219 Episodic tension-type headache, not intractable: Secondary | ICD-10-CM

## 2013-10-18 DIAGNOSIS — G4441 Drug-induced headache, not elsewhere classified, intractable: Secondary | ICD-10-CM

## 2013-10-18 DIAGNOSIS — G4440 Drug-induced headache, not elsewhere classified, not intractable: Secondary | ICD-10-CM

## 2013-10-18 NOTE — Progress Notes (Signed)
NEUROLOGY FOLLOW UP OFFICE NOTE  JSOEPH [name] [id number]  HISTORY OF PRESENT ILLNESS: Dustin [name] is a 53 year old right-handed man with history of hypertension, arthritis, degenerative disc disease, smoker, and gout who follows up for medication-overuse headache.  UPDATE: Since last visit, he has reduced his smoking, lost some weight and blood pressure has been better controlled.  However, the headaches have persisted.  Amitriptyline was increased to 50mg .  He has noted significant improvement in headaches. Intensity:  3-5/10 Duration: 1-2 hours Frequency:  3 days per month  Current abortive therapy:  Ibuprofen or acetaminophen Current preventative therapy:  amitriptyline 50mg  Other current medications:  atenolol 50mg   Caffeine:  Coffee daily.  1-2 liters Pepsi per week Smoking:  5 cigarettes a day (down from 10 and still working on it) Depression/stress:  Better Sleep hygiene:  Better.   06/01/13 MRI BRAIN W WO:  minimal nonspecific white matter changes in both hemispheres, including white matter hyperintensity along the lateral border of the corpus callosum, which is related to hypertension.  HISTORY: Onset:  June 2014 Location:  Variable.  Holocephalic, bilateral jaw and teeth pain that can radiate up to the face and head, posterior head pain radiating up to front of head Quality:  Holocephalic clamping pain, occasional throbbing, stabbing pain in the maxilla and bilateral retro-orbital regions. Intensity:  Anywhere from 5-10/10 (mostly 10/10) Aura:  no Prodrome:  no Associated symptoms:  When fluctuates, notes blurred vision and sees spots.  Photophobia.  No nausea, phonophobia, autonomic symptoms or osmophobia Duration:  constant Frequency:  constant Triggers/exacerbating factors:  Fluorescent light.  Neck pain alone doesn't trigger it, except if he turns his neck and it "pops". Relieving factors:  Vicodin (only dulls it briefly) Activity:  Able to force self to  function  Past abortive therapy:  Vicodin (Took daily.  It helped dull pain), Excedrin, Tylenol #3, ibuprofen, flexeril Past preventative therapy:  none  Family history of headache:  No family history of headache. Family history of other neurological conditions:  Paternal grandfather had ALS  He has history of neck and back pain as well.  He had past history of C4-C7 fusion.  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 C3-C4 mild central stenosis and bilateral lateral recess stenosis. 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 Arneta Cliche at [facility], who told him that there is nothing surgical to be done.  He also had been experiencing tooth pain.  He is scheduled to see a dentist in June.  He has been prescribed antibiotics for possible dental caries.  He has reading glasses, but has not had an eye exam for quite some time.  PAST MEDICAL HISTORY: Past Medical History  Diagnosis Date  . Hypertension   . Arthritis   . Gout   . DDD (degenerative disc disease)     MEDICATIONS: Current Outpatient Prescriptions on File Prior to Visit  Medication Sig Dispense Refill  . amitriptyline (ELAVIL) 50 MG tablet Take 1 tablet (50 mg total) by mouth at bedtime.  30 tablet  0  . aspirin-acetaminophen-caffeine (EXCEDRIN MIGRAINE) [id number] MG per tablet Take 2-4 tablets by mouth daily.       Marland Kitchen atenolol (TENORMIN) 50 MG tablet Take 1 tablet (50 mg total) by mouth daily.  30 tablet  6   No current facility-administered medications on file prior to visit.  ALLERGIES: No Known Allergies  FAMILY HISTORY: Family History  Problem Relation Age of Onset  . Cancer Father   . Diabetes Father   . Rheum arthritis Mother     SOCIAL HISTORY: History   Social History  . Marital Status: Legally Separated    Spouse Name: N/A    Number of Children:  N/A  . Years of Education: N/A   Occupational History  . Not on file.   Social History Main Topics  . Smoking status: Current Every Day Smoker -- 0.50 packs/day    Types: Cigarettes  . Smokeless tobacco: Not on file  . Alcohol Use: Yes     Comment: occasionally- "3 shots a month"  . Drug Use: No  . Sexual Activity: Not on file   Other Topics Concern  . Not on file   Social History Narrative  . No narrative on file    REVIEW OF SYSTEMS: Constitutional: No fevers, chills, or sweats, no generalized fatigue, change in appetite Eyes: No visual changes, double vision, eye pain Ear, nose and throat: No hearing loss, ear pain, nasal congestion, sore throat Cardiovascular: No chest pain, palpitations Respiratory:  No shortness of breath at rest or with exertion, wheezes GastrointestinaI: No nausea, vomiting, diarrhea, abdominal pain, fecal incontinence Genitourinary:  No dysuria, urinary retention or frequency Musculoskeletal:  No neck pain, back pain Integumentary: No rash, pruritus, skin lesions Neurological: as above Psychiatric: No depression, insomnia, anxiety Endocrine: No palpitations, fatigue, diaphoresis, mood swings, change in appetite, change in weight, increased thirst Hematologic/Lymphatic:  No anemia, purpura, petechiae. Allergic/Immunologic: no itchy/runny eyes, nasal congestion, recent allergic reactions, rashes  PHYSICAL EXAM: Filed Vitals:   10/18/13 1352  BP: 148/86  Pulse: 80   General: No acute distress Head:  Normocephalic/atraumatic Neck: supple, no paraspinal tenderness, full range of motion Heart:  Regular rate and rhythm Lungs:  Clear to auscultation bilaterally Back: No paraspinal tenderness Neurological Exam: alert and oriented to person, place, and time. Attention span and concentration intact, recent and remote memory intact, fund of knowledge intact.  Speech fluent and not dysarthric, language intact.  CN II-XII intact. Fundoscopic exam  unremarkable without vessel changes, exudates, hemorrhages or papilledema.  Bulk and tone normal, muscle strength 5/5 throughout.  Sensation to temperature and vibration intact.  Deep tendon reflexes 2+ throughout, toes downgoing.  Finger to nose intact.  Gait normal.  IMPRESSION: Medication-overuse headache, resolved Episodic tension-type headache, stable  PLAN: 1.  Continue amitriptyline 50mg  2.  Acetaminophen or ibuprofen for abortive therapy 3.  Follow up in 5 months.  Metta Clines, DO  CC:  Angelica Chessman, MD

## 2013-10-18 NOTE — Patient Instructions (Signed)
1.  Continue amitriptyline 50mg  at bedtime 2.  Follow up in 5 months.

## 2013-11-15 ENCOUNTER — Telehealth: Payer: Self-pay | Admitting: *Deleted

## 2013-11-15 NOTE — Telephone Encounter (Signed)
Elavil 50 mg #30 with 3 refills called to pharmacy  patient is aware

## 2014-02-07 ENCOUNTER — Other Ambulatory Visit: Payer: Self-pay | Admitting: Internal Medicine

## 2014-02-07 ENCOUNTER — Telehealth: Payer: Self-pay | Admitting: Emergency Medicine

## 2014-02-07 ENCOUNTER — Other Ambulatory Visit: Payer: Self-pay | Admitting: Emergency Medicine

## 2014-02-07 MED ORDER — ATENOLOL 50 MG PO TABS: 50.0000 mg | ORAL_TABLET | Freq: Every day | ORAL | Status: DC

## 2014-02-07 NOTE — Telephone Encounter (Signed)
Pt called in requesting BP medication refill Medication refilled and e-scribed to [place] pt we will give #30 day supply,one refill until seen by provider

## 2014-02-17 ENCOUNTER — Encounter: Payer: Self-pay | Admitting: Internal Medicine

## 2014-02-17 ENCOUNTER — Ambulatory Visit: Payer: No Typology Code available for payment source | Attending: Internal Medicine | Admitting: Internal Medicine

## 2014-02-17 DIAGNOSIS — Z0000 Encounter for general adult medical examination without abnormal findings: Secondary | ICD-10-CM | POA: Insufficient documentation

## 2014-02-17 DIAGNOSIS — M1009 Idiopathic gout, multiple sites: Secondary | ICD-10-CM | POA: Insufficient documentation

## 2014-02-17 DIAGNOSIS — K029 Dental caries, unspecified: Secondary | ICD-10-CM | POA: Insufficient documentation

## 2014-02-17 DIAGNOSIS — I10 Essential (primary) hypertension: Secondary | ICD-10-CM | POA: Insufficient documentation

## 2014-02-17 DIAGNOSIS — G4489 Other headache syndrome: Secondary | ICD-10-CM | POA: Insufficient documentation

## 2014-02-17 DIAGNOSIS — G894 Chronic pain syndrome: Secondary | ICD-10-CM

## 2014-02-17 LAB — POCT GLYCOSYLATED HEMOGLOBIN (HGB A1C): HEMOGLOBIN A1C: 5.2

## 2014-02-17 MED ORDER — PENICILLIN V POTASSIUM 500 MG PO TABS: 500.0000 mg | ORAL_TABLET | Freq: Four times a day (QID) | ORAL | Status: DC

## 2014-02-17 MED ORDER — AMITRIPTYLINE HCL 50 MG PO TABS: 50.0000 mg | ORAL_TABLET | Freq: Every day | ORAL | Status: DC

## 2014-02-17 MED ORDER — ATENOLOL 50 MG PO TABS: 50.0000 mg | ORAL_TABLET | Freq: Every day | ORAL | Status: DC

## 2014-02-17 NOTE — Patient Instructions (Signed)
Dental Caries Dental caries (also called tooth decay) is the most common oral disease. It can occur at any age but is more common in children and young adults.  HOW DENTAL CARIES DEVELOPS  The process of decay begins when bacteria and foods (particularly sugars and starches) combine in your mouth to produce plaque. Plaque is a substance that sticks to the hard, outer surface of a tooth (enamel). The bacteria in plaque produce acids that attack enamel. These acids may also attack the root surface of a tooth (cementum) if it is exposed. Repeated attacks dissolve these surfaces and create holes in the tooth (cavities). If left untreated, the acids destroy the other layers of the tooth.  RISK FACTORS  Frequent sipping of sugary beverages.   Frequent snacking on sugary and starchy foods, especially those that easily get stuck in the teeth.   Poor oral hygiene.   Dry mouth.   Substance abuse such as methamphetamine abuse.   Broken or poor-fitting dental restorations.   Eating disorders.   Gastroesophageal reflux disease (GERD).   Certain radiation treatments to the head and neck. SYMPTOMS In the early stages of dental caries, symptoms are seldom present. Sometimes white, chalky areas may be seen on the enamel or other tooth layers. In later stages, symptoms may include:  Pits and holes on the enamel.  Toothache after sweet, hot, or cold foods or drinks are consumed.  Pain around the tooth.  Swelling around the tooth. DIAGNOSIS  Most of the time, dental caries is detected during a regular dental checkup. A diagnosis is made after a thorough medical and dental history is taken and the surfaces of your teeth are checked for signs of dental caries. Sometimes special instruments, such as lasers, are used to check for dental caries. Dental X-ray exams may be taken so that areas not visible to the eye (such as between the contact areas of the teeth) can be checked for cavities.    TREATMENT  If dental caries is in its early stages, it may be reversed with a fluoride treatment or an application of a remineralizing agent at the dental office. Thorough brushing and flossing at home is needed to aid these treatments. If it is in its later stages, treatment depends on the location and extent of tooth destruction:   If a small area of the tooth has been destroyed, the destroyed area will be removed and cavities will be filled with a material such as gold, silver amalgam, or composite resin.   If a large area of the tooth has been destroyed, the destroyed area will be removed and a cap (crown) will be fitted over the remaining tooth structure.   If the center part of the tooth (pulp) is affected, a procedure called a root canal will be needed before a filling or crown can be placed.   If most of the tooth has been destroyed, the tooth may need to be pulled (extracted). HOME CARE INSTRUCTIONS You can prevent, stop, or reverse dental caries at home by practicing good oral hygiene. Good oral hygiene includes:  Thoroughly cleaning your teeth at least twice a day with a toothbrush and dental floss.   Using a fluoride toothpaste. A fluoride mouth rinse may also be used if recommended by your dentist or health care provider.   Restricting the amount of sugary and starchy foods and sugary liquids you consume.   Avoiding frequent snacking on these foods and sipping of these liquids.   Keeping regular visits with  a dentist for checkups and cleanings. PREVENTION   Practice good oral hygiene.  Consider a dental sealant. A dental sealant is a coating material that is applied by your dentist to the pits and grooves of teeth. The sealant prevents food from being trapped in them. It may protect the teeth for several years.  Ask about fluoride supplements if you live in a community without fluorinated water or with water that has a low fluoride content. Use fluoride supplements  as directed by your dentist or health care provider.  Allow fluoride varnish applications to teeth if directed by your dentist or health care provider. Document Released: 09/15/2001 Document Revised: 05/10/2013 Document Reviewed: 12/27/2011 [facility] Patient Information 2015 [place], [place]. This information is not intended to replace advice given to you by your health care provider. Make sure you discuss any questions you have with your health care provider. DASH Eating Plan DASH stands for "Dietary Approaches to Stop Hypertension." The DASH eating plan is a healthy eating plan that has been shown to reduce high blood pressure (hypertension). Additional health benefits may include reducing the risk of type 2 diabetes mellitus, heart disease, and stroke. The DASH eating plan may also help with weight loss. WHAT DO I NEED TO KNOW ABOUT THE DASH EATING PLAN? For the DASH eating plan, you will follow these general guidelines:  Choose foods with a percent daily value for sodium of less than 5% (as listed on the food label).  Use salt-free seasonings or herbs instead of table salt or sea salt.  Check with your health care provider or pharmacist before using salt substitutes.  Eat lower-sodium products, often labeled as "lower sodium" or "no salt added."  Eat fresh foods.  Eat more vegetables, fruits, and low-fat dairy products.  Choose whole grains. Look for the word "whole" as the first word in the ingredient list.  Choose fish and skinless chicken or [place] more often than red meat. Limit fish, poultry, and meat to 6 oz (170 g) each day.  Limit sweets, desserts, sugars, and sugary drinks.  Choose heart-healthy fats.  Limit cheese to 1 oz (28 g) per day.  Eat more home-cooked food and less restaurant, buffet, and fast food.  Limit fried foods.  Cook foods using methods other than frying.  Limit canned vegetables. If you do use them, rinse them well to decrease the sodium.  When  eating at a restaurant, ask that your food be prepared with less salt, or no salt if possible. WHAT FOODS CAN I EAT? Seek help from a dietitian for individual calorie needs. Grains Whole grain or whole wheat bread. Brown rice. Whole grain or whole wheat pasta. Quinoa, bulgur, and whole grain cereals. Low-sodium cereals. Corn or whole wheat flour tortillas. Whole grain cornbread. Whole grain crackers. Low-sodium crackers. Vegetables Fresh or frozen vegetables (raw, steamed, roasted, or grilled). Low-sodium or reduced-sodium tomato and vegetable juices. Low-sodium or reduced-sodium tomato sauce and paste. Low-sodium or reduced-sodium canned vegetables.  Fruits All fresh, canned (in natural juice), or frozen fruits. Meat and Other Protein Products Ground beef (85% or leaner), grass-fed beef, or beef trimmed of fat. Skinless chicken or [place]. Ground chicken or [place]. Pork trimmed of fat. All fish and seafood. Eggs. Dried beans, peas, or lentils. Unsalted nuts and seeds. Unsalted canned beans. Dairy Low-fat dairy products, such as skim or 1% milk, 2% or reduced-fat cheeses, low-fat ricotta or cottage cheese, or plain low-fat yogurt. Low-sodium or reduced-sodium cheeses. Fats and Oils Tub margarines without trans fats. Light  or reduced-fat mayonnaise and salad dressings (reduced sodium). Avocado. Safflower, olive, or canola oils. Natural peanut or almond butter. Other Unsalted popcorn and pretzels. The items listed above may not be a complete list of recommended foods or beverages. Contact your dietitian for more options. WHAT FOODS ARE NOT RECOMMENDED? Grains White bread. White pasta. White rice. Refined cornbread. Bagels and croissants. Crackers that contain trans fat. Vegetables Creamed or fried vegetables. Vegetables in a cheese sauce. Regular canned vegetables. Regular canned tomato sauce and paste. Regular tomato and vegetable juices. Fruits Dried fruits. Canned fruit in light or heavy  syrup. Fruit juice. Meat and Other Protein Products Fatty cuts of meat. Ribs, chicken wings, bacon, sausage, bologna, salami, chitterlings, fatback, hot dogs, bratwurst, and packaged luncheon meats. Salted nuts and seeds. Canned beans with salt. Dairy Whole or 2% milk, cream, half-and-half, and cream cheese. Whole-fat or sweetened yogurt. Full-fat cheeses or blue cheese. Nondairy creamers and whipped toppings. Processed cheese, cheese spreads, or cheese curds. Condiments Onion and garlic salt, seasoned salt, table salt, and sea salt. Canned and packaged gravies. Worcestershire sauce. Tartar sauce. Barbecue sauce. Teriyaki sauce. Soy sauce, including reduced sodium. Steak sauce. Fish sauce. Oyster sauce. Cocktail sauce. Horseradish. Ketchup and mustard. Meat flavorings and tenderizers. Bouillon cubes. Hot sauce. Tabasco sauce. Marinades. Taco seasonings. Relishes. Fats and Oils Butter, stick margarine, lard, shortening, ghee, and bacon fat. Coconut, palm kernel, or palm oils. Regular salad dressings. Other Pickles and olives. Salted popcorn and pretzels. The items listed above may not be a complete list of foods and beverages to avoid. Contact your dietitian for more information. WHERE CAN I FIND MORE INFORMATION? National Heart, Lung, and Blood Institute: travelstabloid.com Document Released: 12/13/2010 Document Revised: 05/10/2013 Document Reviewed: 10/28/2012 [facility] Patient Information 2015 [place], [place]. This information is not intended to replace advice given to you by your health care provider. Make sure you discuss any questions you have with your health care provider. Hypertension Hypertension, commonly called high blood pressure, is when the force of blood pumping through your arteries is too strong. Your arteries are the blood vessels that carry blood from your heart throughout your body. A blood pressure reading consists of a higher number over a lower  number, such as 110/72. The higher number (systolic) is the pressure inside your arteries when your heart pumps. The lower number (diastolic) is the pressure inside your arteries when your heart relaxes. Ideally you want your blood pressure below 120/80. Hypertension forces your heart to work harder to pump blood. Your arteries may become narrow or stiff. Having hypertension puts you at risk for heart disease, stroke, and other problems.  RISK FACTORS Some risk factors for high blood pressure are controllable. Others are not.  Risk factors you cannot control include:   Race. You may be at higher risk if you are African American.  Age. Risk increases with age.  Gender. Men are at higher risk than women before age 59 years. After age 24, women are at higher risk than men. Risk factors you can control include:  Not getting enough exercise or physical activity.  Being overweight.  Getting too much fat, sugar, calories, or salt in your diet.  Drinking too much alcohol. SIGNS AND SYMPTOMS Hypertension does not usually cause signs or symptoms. Extremely high blood pressure (hypertensive crisis) may cause headache, anxiety, shortness of breath, and nosebleed. DIAGNOSIS  To check if you have hypertension, your health care provider will measure your blood pressure while you are seated, with your arm held at  the level of your heart. It should be measured at least twice using the same arm. Certain conditions can cause a difference in blood pressure between your right and left arms. A blood pressure reading that is higher than normal on one occasion does not mean that you need treatment. If one blood pressure reading is high, ask your health care provider about having it checked again. TREATMENT  Treating high blood pressure includes making lifestyle changes and possibly taking medicine. Living a healthy lifestyle can help lower high blood pressure. You may need to change some of your habits. Lifestyle  changes may include:  Following the DASH diet. This diet is high in fruits, vegetables, and whole grains. It is low in salt, red meat, and added sugars.  Getting at least 2 hours of brisk physical activity every week.  Losing weight if necessary.  Not smoking.  Limiting alcoholic beverages.  Learning ways to reduce stress. If lifestyle changes are not enough to get your blood pressure under control, your health care provider may prescribe medicine. You may need to take more than one. Work closely with your health care provider to understand the risks and benefits. HOME CARE INSTRUCTIONS  Have your blood pressure rechecked as directed by your health care provider.   Take medicines only as directed by your health care provider. Follow the directions carefully. Blood pressure medicines must be taken as prescribed. The medicine does not work as well when you skip doses. Skipping doses also puts you at risk for problems.   Do not smoke.   Monitor your blood pressure at home as directed by your health care provider. SEEK MEDICAL CARE IF:   You think you are having a reaction to medicines taken.  You have recurrent headaches or feel dizzy.  You have swelling in your ankles.  You have trouble with your vision. SEEK IMMEDIATE MEDICAL CARE IF:  You develop a severe headache or confusion.  You have unusual weakness, numbness, or feel faint.  You have severe chest or abdominal pain.  You vomit repeatedly.  You have trouble breathing. MAKE SURE YOU:   Understand these instructions.  Will watch your condition.  Will get help right away if you are not doing well or get worse. Document Released: 12/24/2004 Document Revised: 05/10/2013 Document Reviewed: 10/16/2012 [facility] Patient Information 2015 [place], [place]. This information is not intended to replace advice given to you by your health care provider. Make sure you discuss any questions you have with your health care  provider.

## 2014-02-17 NOTE — Progress Notes (Signed)
Pt is here following up on his HTN and his chronic pain in his lower back and neck. Pt reports having occasional headaches. Pt reports that he needs an antibiotic b/c he has an abscess in his mouth near his last upper molar.

## 2014-02-17 NOTE — Progress Notes (Signed)
Patient ID: Dustin [name], male   DOB: 1960/06/30, 54 y.o.   MRN: [id number]   Dustin [name], is a 54 y.o. male  BOF:[id number]  DPO:[id number]  DOB - May 16, 1960  Chief Complaint  Patient presents with  . Follow-up        Subjective:   Dustin [name] is a 54 y.o. male here today for a follow up visit. Patient has hypertension, gouty arthritis and osteoarthritis of the joints here today for routine follow-up of hypertension. He has no new complaints today except for occasional headaches. He has had some tooth ache feeling like abscess, requesting antibiotics and referral to dentist. He has this type of infection in the past and had to have acute extraction after antibiotics treatment. Blood pressure is not controlled because patient has not been taking medication, claims that his blood pressure today is high because of toothache and body pain. He) blood pressure is better at home, he reports systolic blood pressure range of 110-140 at home. He is on atenolol 50 mg tablet by mouth daily. Patient has No headache, No chest pain, No abdominal pain - No Nausea, No new weakness tingling or numbness, No Cough - SOB.  Problem  Preventative Health Care  Other Headache Syndrome  Chronic Pain Syndrome    ALLERGIES: No Known Allergies  PAST MEDICAL HISTORY: Past Medical History  Diagnosis Date  . Hypertension   . Arthritis   . Gout   . DDD (degenerative disc disease)     MEDICATIONS AT HOME: Prior to Admission medications   Medication Sig Start Date End Date Taking? Authorizing Provider  amitriptyline (ELAVIL) 50 MG tablet Take 1 tablet (50 mg total) by mouth at bedtime. 02/17/14  Yes Tresa Garter, MD  aspirin-acetaminophen-caffeine (EXCEDRIN MIGRAINE) [id number] MG per tablet Take 2-4 tablets by mouth daily.    Yes Historical Provider, MD  atenolol (TENORMIN) 50 MG tablet Take 1 tablet (50 mg total) by mouth daily. 02/17/14  Yes Tresa Garter, MD  ibuprofen (ADVIL,MOTRIN)  200 MG tablet Take 200 mg by mouth as needed.   Yes Historical Provider, MD  penicillin v potassium (VEETID) 500 MG tablet Take 1 tablet (500 mg total) by mouth 4 (four) times daily. 02/17/14   Tresa Garter, MD     Objective:   Filed Vitals:   02/17/14 1441  BP: 154/97  Pulse: 75  Temp: 98.3 F (36.8 C)  TempSrc: Oral  Resp: 16  Height: 6\' 2"  (1.88 m)  Weight: 278 lb (126.1 kg)  SpO2: 98%    Exam General appearance : Awake, alert, not in any distress. Speech Clear. Not toxic looking HEENT: Atraumatic and Normocephalic, pupils equally reactive to light and accomodation Neck: supple, no JVD. No cervical lymphadenopathy.  Chest:Good air entry bilaterally, no added sounds  CVS: S1 S2 regular, no murmurs.  Abdomen: Bowel sounds present, Non tender and not distended with no gaurding, rigidity or rebound. Extremities: B/L Lower Ext shows no edema, both legs are warm to touch Neurology: Awake alert, and oriented X 3, CN II-XII intact, Non focal Skin:No Rash Wounds:N/A  Data Review Lab Results  Component Value Date   HGBA1C 5.1 01/28/2013     Assessment & Plan   1. Essential hypertension, benign  - CBC with Differential/Platelet - COMPLETE METABOLIC PANEL WITH GFR - POCT glycosylated hemoglobin (Hb A1C) - Lipid panel - TSH - Urinalysis, Complete  - We have discussed blood pressure goals, patient is to report to clinic immediately if blood pressure stays consistently  above 140/90 after treatment of tooth abscess/pain - DASH Diet emphasized  2. Dental caries  - penicillin v potassium (VEETID) 500 MG tablet; Take 1 tablet (500 mg total) by mouth 4 (four) times daily.  Dispense: 20 tablet; Refill: 0 - Ambulatory referral to Dentistry  3. Other headache syndrome  - amitriptyline (ELAVIL) 50 MG tablet; Take 1 tablet (50 mg total) by mouth at bedtime.  Dispense: 30 tablet; Refill: 5  4. Chronic pain syndrome  - Ambulatory referral to Pain Clinic   Patient was  counseled extensively about nutrition and exercise.   Return in about 3 months (around 05/18/2014), or if symptoms worsen or fail to improve, for Follow up HTN, Follow up Pain and comorbidities.  The patient was given clear instructions to go to ER or return to medical center if symptoms don't improve, worsen or new problems develop. The patient verbalized understanding. The patient was told to call to get lab results if they haven't heard anything in the next week.   This note has been created with Surveyor, quantity. Any transcriptional errors are unintentional.    Angelica Chessman, MD, [place], [place], [place] [facility] [place], [place]   02/17/2014, 3:59 PM

## 2014-02-18 ENCOUNTER — Telehealth: Payer: Self-pay | Admitting: Emergency Medicine

## 2014-02-18 LAB — COMPLETE METABOLIC PANEL WITH GFR
ALT: 28 U/L (ref 0–53)
AST: 23 U/L (ref 0–37)
Albumin: 4.4 g/dL (ref 3.5–5.2)
Alkaline Phosphatase: 71 U/L (ref 39–117)
BILIRUBIN TOTAL: 0.3 mg/dL (ref 0.2–1.2)
BUN: 16 mg/dL (ref 6–23)
CO2: 28 mEq/L (ref 19–32)
Calcium: 9.2 mg/dL (ref 8.4–10.5)
Chloride: 101 mEq/L (ref 96–112)
Creat: 1.28 mg/dL (ref 0.50–1.35)
GFR, EST NON AFRICAN AMERICAN: 64 mL/min
GFR, Est African American: 74 mL/min
Glucose, Bld: 91 mg/dL (ref 70–99)
Potassium: 5.2 mEq/L (ref 3.5–5.3)
SODIUM: 137 meq/L (ref 135–145)
TOTAL PROTEIN: 6.9 g/dL (ref 6.0–8.3)

## 2014-02-18 LAB — CBC WITH DIFFERENTIAL/PLATELET
BASOS ABS: 0.1 10*3/uL (ref 0.0–0.1)
Basophils Relative: 1 % (ref 0–1)
Eosinophils Absolute: 0.3 10*3/uL (ref 0.0–0.7)
Eosinophils Relative: 4 % (ref 0–5)
HCT: 47.6 % (ref 39.0–52.0)
Hemoglobin: 15.8 g/dL (ref 13.0–17.0)
LYMPHS PCT: 31 % (ref 12–46)
Lymphs Abs: 2.5 10*3/uL (ref 0.7–4.0)
MCH: 31.2 pg (ref 26.0–34.0)
MCHC: 33.2 g/dL (ref 30.0–36.0)
MCV: 93.9 fL (ref 78.0–100.0)
MPV: 9.3 fL (ref 8.6–12.4)
Monocytes Absolute: 1 10*3/uL (ref 0.1–1.0)
Monocytes Relative: 12 % (ref 3–12)
Neutro Abs: 4.2 10*3/uL (ref 1.7–7.7)
Neutrophils Relative %: 52 % (ref 43–77)
PLATELETS: 273 10*3/uL (ref 150–400)
RBC: 5.07 MIL/uL (ref 4.22–5.81)
RDW: 14 % (ref 11.5–15.5)
WBC: 8 10*3/uL (ref 4.0–10.5)

## 2014-02-18 LAB — URINALYSIS, COMPLETE
BILIRUBIN URINE: NEGATIVE
Bacteria, UA: NONE SEEN
Casts: NONE SEEN
Crystals: NONE SEEN
Glucose, UA: NEGATIVE mg/dL
Hgb urine dipstick: NEGATIVE
Ketones, ur: NEGATIVE mg/dL
LEUKOCYTES UA: NEGATIVE
Nitrite: NEGATIVE
PROTEIN: NEGATIVE mg/dL
SQUAMOUS EPITHELIAL / LPF: NONE SEEN
Specific Gravity, Urine: 1.021 (ref 1.005–1.030)
Urobilinogen, UA: 0.2 mg/dL (ref 0.0–1.0)
pH: 5 (ref 5.0–8.0)

## 2014-02-18 LAB — LIPID PANEL
Cholesterol: 160 mg/dL (ref 0–200)
HDL: 33 mg/dL — ABNORMAL LOW (ref >39)
LDL Cholesterol: 63 mg/dL (ref 0–99)
TRIGLYCERIDES: 321 mg/dL — AB (ref <150)
Total CHOL/HDL Ratio: 4.8 Ratio
VLDL: 64 mg/dL — ABNORMAL HIGH (ref 0–40)

## 2014-02-18 LAB — TSH: TSH: 1.767 u[IU]/mL (ref 0.350–4.500)

## 2014-02-18 NOTE — Telephone Encounter (Signed)
-----   Message from Tresa Garter, MD sent at 02/18/2014 10:23 AM EST ----- Please inform patient that his laboratory test results are within normal limit. Triglyceride (a form of cholesterol) is high, advise patient on low-cholesterol low-fat diet and regular physical exercise

## 2014-02-18 NOTE — Telephone Encounter (Signed)
Left message with lab results and diet/exercise instructions

## 2014-03-21 ENCOUNTER — Ambulatory Visit: Payer: No Typology Code available for payment source | Admitting: Neurology

## 2014-03-31 ENCOUNTER — Other Ambulatory Visit: Payer: Self-pay | Admitting: Internal Medicine

## 2014-04-15 ENCOUNTER — Ambulatory Visit (INDEPENDENT_AMBULATORY_CARE_PROVIDER_SITE_OTHER): Payer: Self-pay | Admitting: Neurology

## 2014-04-15 ENCOUNTER — Encounter: Payer: Self-pay | Admitting: Neurology

## 2014-04-15 VITALS — BP 140/86 | HR 80 | Resp 20 | Ht 74.0 in | Wt 268.7 lb

## 2014-04-15 DIAGNOSIS — G894 Chronic pain syndrome: Secondary | ICD-10-CM

## 2014-04-15 DIAGNOSIS — G44219 Episodic tension-type headache, not intractable: Secondary | ICD-10-CM

## 2014-04-15 DIAGNOSIS — Z720 Tobacco use: Secondary | ICD-10-CM

## 2014-04-15 NOTE — Progress Notes (Addendum)
NEUROLOGY FOLLOW UP OFFICE NOTE  Dustin [name] [id number]  HISTORY OF PRESENT ILLNESS: Dustin [name] is a 54 year old right-handed man with history of hypertension, arthritis, degenerative disc disease, current tobacco smoker, and gout who follows up for tension-type headache.  Records and labs reviewed.  UPDATE: Headaches are well controlled. They occur once in a while.  They respond to ibuprofen. Current preventative therapy:  amitriptyline 50mg   His main issue now is his chronic pain that is constant in his neck and back from DDD and will radiate to involve all his extremities, including cramps in the legs.  This has gotten worse since stopping the opiates.  Rheumatologic workup was normal.  He was referred to the pain clinic but they are not taking new patients at this time.  HISTORY: Onset:  June 2014 Location:  Variable.  Holocephalic, bilateral jaw and teeth pain that can radiate up to the face and head, posterior head pain radiating up to front of head Quality:  Holocephalic clamping pain, occasional throbbing, stabbing pain in the maxilla and bilateral retro-orbital regions. Initial intensity:  Anywhere from 5-10/10 (mostly 10/10); October 3-5/10 Aura:  no Prodrome:  no Associated symptoms:  When fluctuates, notes blurred vision and sees spots.  Photophobia.  No nausea, phonophobia, autonomic symptoms or osmophobia Initial duration:  constant; October 1-2 hours Initial frequency:  constant; October 3 days per month Triggers/exacerbating factors:  Fluorescent light.  Neck pain alone doesn't trigger it, except if he turns his neck and it "pops". Relieving factors:  Vicodin (only dulls it briefly) Activity:  Able to force self to function  Past abortive therapy:  Vicodin (Took daily.  It helped dull pain), Tylenol #3 Past preventative therapy:  none  Caffeine:  Coffee daily.  1-2 liters Pepsi per week Depression/stress:  Stress related to the headache and caring for sick  mother. Sleep hygiene:  Only sleeps 2 hours at a time.  Not rested. Family history of headache:  No family history of headache. Family history of other neurological conditions:  Paternal grandfather had ALS   He has history of neck and back pain as well.  He had past history of C4-C7 fusion.  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 C3-C4 mild central stenosis and bilateral lateral recess stenosis. 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 Arneta Cliche at [facility], who told him that there is nothing surgical to be done.  He also had been experiencing tooth pain.  He is scheduled to see a dentist in June.  He has been prescribed antibiotics for possible dental caries.  He has reading glasses, but has not had an eye exam for quite some time.  PAST MEDICAL HISTORY: Past Medical History  Diagnosis Date  . Hypertension   . Arthritis   . Gout   . DDD (degenerative disc disease)     MEDICATIONS: Current Outpatient Prescriptions on File Prior to Visit  Medication Sig Dispense Refill  . amitriptyline (ELAVIL) 50 MG tablet Take 1 tablet (50 mg total) by mouth at bedtime. 30 tablet 5  . aspirin-acetaminophen-caffeine (EXCEDRIN MIGRAINE) 010-932-35 MG per tablet Take 2-4 tablets by mouth daily.     Marland Kitchen atenolol (TENORMIN) 50 MG tablet Take 1 tablet (50 mg total) by mouth daily. 90 tablet 3  . atenolol (TENORMIN) 50 MG tablet TAKE 1 TABLET BY MOUTH DAILY 30  tablet 1  . ibuprofen (ADVIL,MOTRIN) 200 MG tablet Take 200 mg by mouth as needed.    . penicillin v potassium (VEETID) 500 MG tablet Take 1 tablet (500 mg total) by mouth 4 (four) times daily. (Patient not taking: Reported on 04/15/2014) 20 tablet 0   No current facility-administered medications on file prior to visit.    ALLERGIES: No Known Allergies  FAMILY HISTORY: Family History    Problem Relation Age of Onset  . Cancer Father   . Diabetes Father   . Rheum arthritis Mother     SOCIAL HISTORY: History   Social History  . Marital Status: Legally Separated    Spouse Name: N/A  . Number of Children: N/A  . Years of Education: N/A   Occupational History  . Not on file.   Social History Main Topics  . Smoking status: Current Every Day Smoker -- 0.50 packs/day    Types: Cigarettes  . Smokeless tobacco: Not on file  . Alcohol Use: 0.0 oz/week    0 Standard drinks or equivalent per week     Comment: occasionally- "3 shots a month"  . Drug Use: No  . Sexual Activity:    Partners: Female   Other Topics Concern  . Not on file   Social History Narrative    REVIEW OF SYSTEMS: Constitutional: No fevers, chills, or sweats, no generalized fatigue, change in appetite Eyes: No visual changes, double vision, eye pain Ear, nose and throat: No hearing loss, ear pain, nasal congestion, sore throat Cardiovascular: No chest pain, palpitations Respiratory:  No shortness of breath at rest or with exertion, wheezes GastrointestinaI: No nausea, vomiting, diarrhea, abdominal pain, fecal incontinence Genitourinary:  No dysuria, urinary retention or frequency Musculoskeletal:  Neck pain, back pain Integumentary: No rash, pruritus, skin lesions Neurological: as above Psychiatric: No depression, insomnia, anxiety Endocrine: No palpitations, fatigue, diaphoresis, mood swings, change in appetite, change in weight, increased thirst Hematologic/Lymphatic:  No anemia, purpura, petechiae. Allergic/Immunologic: no itchy/runny eyes, nasal congestion, recent allergic reactions, rashes  PHYSICAL EXAM: Filed Vitals:   04/15/14 0758  BP: 140/86  Pulse: 80  Resp: 20   General: No acute distress Head:  Normocephalic/atraumatic Eyes:  Fundoscopic exam unremarkable without vessel changes, exudates, hemorrhages or papilledema. Neck: supple, no paraspinal tenderness, full range of  motion Heart:  Regular rate and rhythm Lungs:  Clear to auscultation bilaterally Back: No paraspinal tenderness Neurological Exam: alert and oriented to person, place, and time. Attention span and concentration intact, recent and remote memory intact, fund of knowledge intact.  Speech fluent and not dysarthric, language intact.  CN II-XII intact. Fundoscopic exam unremarkable without vessel changes, exudates, hemorrhages or papilledema.  Bulk and tone normal, muscle strength 5/5 throughout.  Sensation to light touch, temperature and vibration intact.  Deep tendon reflexes 2+ throughout.  Finger to nose testing intact.  Gait normal  IMPRESSION: Tension-type headache, initially due to medication-overuse, resolved. Chronic pain syndrome.  PLAN: Headaches are now under control and amitriptyline 50mg  at bedtime will be refilled by his PCP. Unfortunately, I do not treat chronic pain.  Once the pain clinic opens up, he should make an appointment Again, smoking cessation stressed Follow up as needed.  Metta Clines, DO

## 2014-06-03 ENCOUNTER — Ambulatory Visit: Payer: No Typology Code available for payment source | Attending: Family Medicine | Admitting: Family Medicine

## 2014-06-03 ENCOUNTER — Ambulatory Visit (HOSPITAL_COMMUNITY)
Admission: RE | Admit: 2014-06-03 | Discharge: 2014-06-03 | Disposition: A | Discharge status: Home / Self Care | Payer: No Typology Code available for payment source | Source: Ambulatory Visit | Attending: Family Medicine | Admitting: Family Medicine

## 2014-06-03 ENCOUNTER — Telehealth: Payer: Self-pay | Admitting: Family Medicine

## 2014-06-03 VITALS — BP 152/100 | HR 109 | Wt 270.6 lb

## 2014-06-03 DIAGNOSIS — M179 Osteoarthritis of knee, unspecified: Secondary | ICD-10-CM | POA: Insufficient documentation

## 2014-06-03 DIAGNOSIS — M25562 Pain in left knee: Secondary | ICD-10-CM

## 2014-06-03 DIAGNOSIS — M25462 Effusion, left knee: Secondary | ICD-10-CM | POA: Insufficient documentation

## 2014-06-03 MED ORDER — ACETAMINOPHEN-CODEINE #3 300-30 MG PO TABS: 1.0000 | ORAL_TABLET | Freq: Three times a day (TID) | ORAL | Status: DC | PRN

## 2014-06-03 NOTE — Telephone Encounter (Signed)
Please let know that there is no acute injury, except a small amount of fluid collection on the knee cap. There are arthritic changes.

## 2014-06-03 NOTE — Progress Notes (Signed)
Subjective:     Patient ID: Dustin [name], male   DOB: 1960/11/13, 54 y.o.   MRN: [id number]  HPI   Patient of Dr. Doreene Burke with chronic pain symdrome, who is waiting on referral to pain clinic. He presents today with a new complaint of right knee pain after a fall last week end. He reports he fell on his knee and also twisted it and twisted his back which also hurts. He is wearing a knee brace and has been taking aleve. He request Tylenol #3   Review of Systems   Denies fever, chills, wt loss, appetite loss, extreme fatigue Denies neck pain or stiffness Denies chest pain or palpitations Denies cough or shortness of breath Admits to musculoskeletal pain including low back and both knees.     Objective:   Physical Exam    Alert, oriented, appropriate, extremely talkative. Neck is supple FROM w/o adenopathy or tenderness Lungs are clear to auscultation HS are regular w/o m,g,r There is lateral and medial tenderness of the left knee. There is pain with flexion and extension. He walks with a limp. There is a resolving bruise on the anterior knee.     ASSESSMENT and PLAN     L knee pain - Xray of knee - Tylenol # 3 #60, 1-2 po q 8 hours prn sparingly.  Low back pain - Tylenol #3 #60, 1-2 po q 8 hours prn sparingly.  Hypertension and has not taken his medication today - We have had him take his medication now for BP of 150/100  Follow-up with Dr. Doreene Burke for referral to ortho and or MRI.     Micheline Chapman, FNP-BC [facility].

## 2014-06-03 NOTE — Patient Instructions (Signed)
1. Use tylenol #3 sparingly 2. I have ordered an x-ray of your knee. 3. Use heat to knee several times a day 4. Wear brace if helps. 5.  Follow-up with Dr. Christella Noa as planned.

## 2014-06-10 ENCOUNTER — Telehealth: Payer: Self-pay | Admitting: Family Medicine

## 2014-06-10 NOTE — Telephone Encounter (Signed)
Patient called. Patient did not answer. Patient was informed to return call at [phone].

## 2014-06-10 NOTE — Telephone Encounter (Signed)
-----   Message from Micheline Chapman, NP sent at 06/10/2014  3:32 PM EDT ----- He may already know but may inform him there was no fracture of knee. There is arthritic changes and a little fluid collection just above the knee. Follow-up with PCP as needed.

## 2014-06-16 ENCOUNTER — Encounter: Payer: Self-pay | Admitting: Internal Medicine

## 2014-06-16 ENCOUNTER — Ambulatory Visit: Payer: No Typology Code available for payment source | Attending: Internal Medicine | Admitting: Internal Medicine

## 2014-06-16 VITALS — BP 174/81 | HR 68 | Temp 98.2°F | Ht 74.0 in | Wt 276.8 lb

## 2014-06-16 DIAGNOSIS — M25562 Pain in left knee: Secondary | ICD-10-CM

## 2014-06-16 DIAGNOSIS — I10 Essential (primary) hypertension: Secondary | ICD-10-CM

## 2014-06-16 DIAGNOSIS — G894 Chronic pain syndrome: Secondary | ICD-10-CM

## 2014-06-16 MED ORDER — ACETAMINOPHEN-CODEINE #3 300-30 MG PO TABS: 1.0000 | ORAL_TABLET | Freq: Three times a day (TID) | ORAL | Status: DC | PRN

## 2014-06-16 MED ORDER — ATENOLOL 50 MG PO TABS: 50.0000 mg | ORAL_TABLET | Freq: Every day | ORAL | Status: DC

## 2014-06-16 MED ORDER — HYDROCHLOROTHIAZIDE 12.5 MG PO TABS: 12.5000 mg | ORAL_TABLET | Freq: Every day | ORAL | Status: DC

## 2014-06-16 NOTE — Progress Notes (Signed)
Patient here for follow on left knee pain He has had an xray which was normal He is using ice packs, tylenol 3, ibuprofen,aleve with little relief

## 2014-06-16 NOTE — Progress Notes (Signed)
Patient ID: Dustin [name], male   DOB: Mar 17, 1960, 54 y.o.   MRN: [id number]   Dustin [name], is a 54 y.o. male  OEV:[id number]  WEX:[id number]  DOB - 1960-07-05  Chief Complaint  Patient presents with  . Knee Pain        Subjective:   Dustin [name] is a 54 y.o. male here today for a follow up visit. Patient has history of hypertension, osteoarthritis of the knee, chronic pain syndrome awaiting referral to pain clinic here today for follow-up visit. Patient was recently seen at the walk-in clinic after a fall at home where he injured his already painful right knee. He has been given pain medications, x-ray showed osteoarthritis, however pain continues and getting worse. He feels a popping sound all the time when stands from a sitting position. He thinks he might have chipped a bone or cartilage in the joint. He would like more pain medication and further evaluation. Patient has No headache, No chest pain, No abdominal pain - No Nausea, No new weakness tingling or numbness, No Cough - SOB.  Problem  Left Knee Pain  Uncontrolled Hypertension    ALLERGIES: No Known Allergies  PAST MEDICAL HISTORY: Past Medical History  Diagnosis Date  . Hypertension   . Arthritis   . Gout   . DDD (degenerative disc disease)     MEDICATIONS AT HOME: Prior to Admission medications   Medication Sig Start Date End Date Taking? Authorizing Provider  acetaminophen-codeine (TYLENOL #3) 300-30 MG per tablet Take 1-2 tablets by mouth every 8 (eight) hours as needed for moderate pain. 06/16/14  Yes Tresa Garter, MD  amitriptyline (ELAVIL) 50 MG tablet Take 1 tablet (50 mg total) by mouth at bedtime. 02/17/14  Yes Tresa Garter, MD  aspirin-acetaminophen-caffeine (EXCEDRIN MIGRAINE) [phone] MG per tablet Take 2-4 tablets by mouth daily.    Yes Historical Provider, MD  atenolol (TENORMIN) 50 MG tablet Take 1 tablet (50 mg total) by mouth daily. 06/16/14  Yes Tresa Garter, MD  ibuprofen  (ADVIL,MOTRIN) 200 MG tablet Take 200 mg by mouth as needed.   Yes Historical Provider, MD  naproxen sodium (ANAPROX) 220 MG tablet 220 mg 2 (two) times daily with a meal.   Yes Historical Provider, MD  hydrochlorothiazide (HYDRODIURIL) 12.5 MG tablet Take 1 tablet (12.5 mg total) by mouth daily. 06/16/14   Tresa Garter, MD     Objective:   Filed Vitals:   06/16/14 1504  BP: 174/81  Pulse: 68  Temp: 98.2 F (36.8 C)  Height: 6\' 2"  (1.88 m)  Weight: 276 lb 12.8 oz (125.556 kg)  SpO2: 97%    Exam General appearance : Awake, alert, not in any distress. Speech Clear. Not toxic looking HEENT: Atraumatic and Normocephalic, pupils equally reactive to light and accomodation Neck: supple, no JVD. No cervical lymphadenopathy.  Chest:Good air entry bilaterally, no added sounds  CVS: S1 S2 regular, no murmurs.  Abdomen: Bowel sounds present, Non tender and not distended with no gaurding, rigidity or rebound. Extremities: B/L Lower Ext shows no edema, both legs are warm to touch Neurology: Awake alert, and oriented X 3, CN II-XII intact, Non focal Skin:No Rash  Data Review Lab Results  Component Value Date   HGBA1C 5.20 02/17/2014   HGBA1C 5.1 01/28/2013     Assessment & Plan   1. Left knee pain  - acetaminophen-codeine (TYLENOL #3) 300-30 MG per tablet; Take 1-2 tablets by mouth every 8 (eight) hours as needed for moderate  pain.  Dispense: 90 tablet; Refill: 0  - MR Knee Left  Wo Contrast; Future  2. Uncontrolled hypertension: Repeat BP is 155/89 mmHg Will Add low dose HCTZ for better BP control  - hydrochlorothiazide (HYDRODIURIL) 12.5 MG tablet; Take 1 tablet (12.5 mg total) by mouth daily.  Dispense: 90 tablet; Refill: 3 Continue - atenolol (TENORMIN) 50 MG tablet; Take 1 tablet (50 mg total) by mouth daily.  Dispense: 90 tablet; Refill: 3  - We have discussed target BP range and blood pressure goal - I have advised patient to check BP regularly and to call us back or  report to clinic if the numbers are consistently higher than 140/90  - We discussed the importance of compliance with medical therapy and DASH diet recommended, consequences of uncontrolled hypertension discussed.   3. Chronic pain syndrome  - Ambulatory referral to Pain Clinic  Patient have been counseled extensively about nutrition and exercise Return in about 3 months (around 09/16/2014) for Follow up HTN, Routine Follow Up, Follow up Pain and comorbidities.  The patient was given clear instructions to go to ER or return to medical center if symptoms don't improve, worsen or new problems develop. The patient verbalized understanding. The patient was told to call to get lab results if they haven't heard anything in the next week.   This note has been created with Surveyor, quantity. Any transcriptional errors are unintentional.    Angelica Chessman, MD, [place], [place], [place], [place] and [place], [place]   06/16/2014, 3:35 PM

## 2014-06-16 NOTE — Patient Instructions (Signed)
Knee Pain The knee is the complex joint between your thigh and your lower leg. It is made up of bones, tendons, ligaments, and cartilage. The bones that make up the knee are:  The femur in the thigh.  The tibia and fibula in the lower leg.  The patella or kneecap riding in the groove on the lower femur. CAUSES  Knee pain is a common complaint with many causes. A few of these causes are:  Injury, such as:  A ruptured ligament or tendon injury.  Torn cartilage.  Medical conditions, such as:  Gout  Arthritis  Infections  Overuse, over training, or overdoing a physical activity. Knee pain can be minor or severe. Knee pain can accompany debilitating injury. Minor knee problems often respond well to self-care measures or get well on their own. More serious injuries may need medical intervention or even surgery. SYMPTOMS The knee is complex. Symptoms of knee problems can vary widely. Some of the problems are:  Pain with movement and weight bearing.  Swelling and tenderness.  Buckling of the knee.  Inability to straighten or extend your knee.  Your knee locks and you cannot straighten it.  Warmth and redness with pain and fever.  Deformity or dislocation of the kneecap. DIAGNOSIS  Determining what is wrong may be very straight forward such as when there is an injury. It can also be challenging because of the complexity of the knee. Tests to make a diagnosis may include:  Your caregiver taking a history and doing a physical exam.  Routine X-rays can be used to rule out other problems. X-rays will not reveal a cartilage tear. Some injuries of the knee can be diagnosed by:  Arthroscopy a surgical technique by which a small video camera is inserted through tiny incisions on the sides of the knee. This procedure is used to examine and repair internal knee joint problems. Tiny instruments can be used during arthroscopy to repair the torn knee cartilage (meniscus).  Arthrography  is a radiology technique. A contrast liquid is directly injected into the knee joint. Internal structures of the knee joint then become visible on X-ray film.  An MRI scan is a non X-ray radiology procedure in which magnetic fields and a computer produce two- or three-dimensional images of the inside of the knee. Cartilage tears are often visible using an MRI scanner. MRI scans have largely replaced arthrography in diagnosing cartilage tears of the knee.  Blood work.  Examination of the fluid that helps to lubricate the knee joint (synovial fluid). This is done by taking a sample out using a needle and a syringe. TREATMENT The treatment of knee problems depends on the cause. Some of these treatments are:  Depending on the injury, proper casting, splinting, surgery, or physical therapy care will be needed.  Give yourself adequate recovery time. Do not overuse your joints. If you begin to get sore during workout routines, back off. Slow down or do fewer repetitions.  For repetitive activities such as cycling or running, maintain your strength and nutrition.  Alternate muscle groups. For example, if you are a weight lifter, work the upper body on one day and the lower body the next.  Either tight or weak muscles do not give the proper support for your knee. Tight or weak muscles do not absorb the stress placed on the knee joint. Keep the muscles surrounding the knee strong.  Take care of mechanical problems.  If you have flat feet, orthotics or special shoes may help.  See your caregiver if you need help.  Arch supports, sometimes with wedges on the inner or outer aspect of the heel, can help. These can shift pressure away from the side of the knee most bothered by osteoarthritis.  A brace called an "unloader" brace also may be used to help ease the pressure on the most arthritic side of the knee.  If your caregiver has prescribed crutches, braces, wraps or ice, use as directed. The acronym  for this is PRICE. This means protection, rest, ice, compression, and elevation.  Nonsteroidal anti-inflammatory drugs (NSAIDs), can help relieve pain. But if taken immediately after an injury, they may actually increase swelling. Take NSAIDs with food in your stomach. Stop them if you develop stomach problems. Do not take these if you have a history of ulcers, stomach pain, or bleeding from the bowel. Do not take without your caregiver's approval if you have problems with fluid retention, heart failure, or kidney problems.  For ongoing knee problems, physical therapy may be helpful.  Glucosamine and chondroitin are over-the-counter dietary supplements. Both may help relieve the pain of osteoarthritis in the knee. These medicines are different from the usual anti-inflammatory drugs. Glucosamine may decrease the rate of cartilage destruction.  Injections of a corticosteroid drug into your knee joint may help reduce the symptoms of an arthritis flare-up. They may provide pain relief that lasts a few months. You may have to wait a few months between injections. The injections do have a small increased risk of infection, water retention, and elevated blood sugar levels.  Hyaluronic acid injected into damaged joints may ease pain and provide lubrication. These injections may work by reducing inflammation. A series of shots may give relief for as long as 6 months.  Topical painkillers. Applying certain ointments to your skin may help relieve the pain and stiffness of osteoarthritis. Ask your pharmacist for suggestions. Many over the-counter products are approved for temporary relief of arthritis pain.  In some countries, doctors often prescribe topical NSAIDs for relief of chronic conditions such as arthritis and tendinitis. A review of treatment with NSAID creams found that they worked as well as oral medications but without the serious side effects. PREVENTION  Maintain a healthy weight. Extra pounds  put more strain on your joints.  Get strong, stay limber. Weak muscles are a common cause of knee injuries. Stretching is important. Include flexibility exercises in your workouts.  Be smart about exercise. If you have osteoarthritis, chronic knee pain or recurring injuries, you may need to change the way you exercise. This does not mean you have to stop being active. If your knees ache after jogging or playing basketball, consider switching to swimming, water aerobics, or other low-impact activities, at least for a few days a week. Sometimes limiting high-impact activities will provide relief.  Make sure your shoes fit well. Choose footwear that is right for your sport.  Protect your knees. Use the proper gear for knee-sensitive activities. Use kneepads when playing volleyball or laying carpet. Buckle your seat belt every time you drive. Most shattered kneecaps occur in car accidents.  Rest when you are tired. SEEK MEDICAL CARE IF:  You have knee pain that is continual and does not seem to be getting better.  SEEK IMMEDIATE MEDICAL CARE IF:  Your knee joint feels hot to the touch and you have a high fever. MAKE SURE YOU:   Understand these instructions.  Will watch your condition.  Will get help right away if you are not  doing well or get worse. Document Released: 10/21/2006 Document Revised: 03/18/2011 Document Reviewed: 10/21/2006 [facility] Patient Information 2015 [place], [place]. This information is not intended to replace advice given to you by your health care provider. Make sure you discuss any questions you have with your health care provider. DASH Eating Plan DASH stands for "Dietary Approaches to Stop Hypertension." The DASH eating plan is a healthy eating plan that has been shown to reduce high blood pressure (hypertension). Additional health benefits may include reducing the risk of type 2 diabetes mellitus, heart disease, and stroke. The DASH eating plan may also help with  weight loss. WHAT DO I NEED TO KNOW ABOUT THE DASH EATING PLAN? For the DASH eating plan, you will follow these general guidelines:  Choose foods with a percent daily value for sodium of less than 5% (as listed on the food label).  Use salt-free seasonings or herbs instead of table salt or sea salt.  Check with your health care provider or pharmacist before using salt substitutes.  Eat lower-sodium products, often labeled as "lower sodium" or "no salt added."  Eat fresh foods.  Eat more vegetables, fruits, and low-fat dairy products.  Choose whole grains. Look for the word "whole" as the first word in the ingredient list.  Choose fish and skinless chicken or [place] more often than red meat. Limit fish, poultry, and meat to 6 oz (170 g) each day.  Limit sweets, desserts, sugars, and sugary drinks.  Choose heart-healthy fats.  Limit cheese to 1 oz (28 g) per day.  Eat more home-cooked food and less restaurant, buffet, and fast food.  Limit fried foods.  Cook foods using methods other than frying.  Limit canned vegetables. If you do use them, rinse them well to decrease the sodium.  When eating at a restaurant, ask that your food be prepared with less salt, or no salt if possible. WHAT FOODS CAN I EAT? Seek help from a dietitian for individual calorie needs. Grains Whole grain or whole wheat bread. Brown rice. Whole grain or whole wheat pasta. Quinoa, bulgur, and whole grain cereals. Low-sodium cereals. Corn or whole wheat flour tortillas. Whole grain cornbread. Whole grain crackers. Low-sodium crackers. Vegetables Fresh or frozen vegetables (raw, steamed, roasted, or grilled). Low-sodium or reduced-sodium tomato and vegetable juices. Low-sodium or reduced-sodium tomato sauce and paste. Low-sodium or reduced-sodium canned vegetables.  Fruits All fresh, canned (in natural juice), or frozen fruits. Meat and Other Protein Products Ground beef (85% or leaner), grass-fed beef, or  beef trimmed of fat. Skinless chicken or [place]. Ground chicken or [place]. Pork trimmed of fat. All fish and seafood. Eggs. Dried beans, peas, or lentils. Unsalted nuts and seeds. Unsalted canned beans. Dairy Low-fat dairy products, such as skim or 1% milk, 2% or reduced-fat cheeses, low-fat ricotta or cottage cheese, or plain low-fat yogurt. Low-sodium or reduced-sodium cheeses. Fats and Oils Tub margarines without trans fats. Light or reduced-fat mayonnaise and salad dressings (reduced sodium). Avocado. Safflower, olive, or canola oils. Natural peanut or almond butter. Other Unsalted popcorn and pretzels. The items listed above may not be a complete list of recommended foods or beverages. Contact your dietitian for more options. WHAT FOODS ARE NOT RECOMMENDED? Grains White bread. White pasta. White rice. Refined cornbread. Bagels and croissants. Crackers that contain trans fat. Vegetables Creamed or fried vegetables. Vegetables in a cheese sauce. Regular canned vegetables. Regular canned tomato sauce and paste. Regular tomato and vegetable juices. Fruits Dried fruits. Canned fruit in light or heavy syrup.  Fruit juice. Meat and Other Protein Products Fatty cuts of meat. Ribs, chicken wings, bacon, sausage, bologna, salami, chitterlings, fatback, hot dogs, bratwurst, and packaged luncheon meats. Salted nuts and seeds. Canned beans with salt. Dairy Whole or 2% milk, cream, half-and-half, and cream cheese. Whole-fat or sweetened yogurt. Full-fat cheeses or blue cheese. Nondairy creamers and whipped toppings. Processed cheese, cheese spreads, or cheese curds. Condiments Onion and garlic salt, seasoned salt, table salt, and sea salt. Canned and packaged gravies. Worcestershire sauce. Tartar sauce. Barbecue sauce. Teriyaki sauce. Soy sauce, including reduced sodium. Steak sauce. Fish sauce. Oyster sauce. Cocktail sauce. Horseradish. Ketchup and mustard. Meat flavorings and tenderizers. Bouillon cubes.  Hot sauce. Tabasco sauce. Marinades. Taco seasonings. Relishes. Fats and Oils Butter, stick margarine, lard, shortening, ghee, and bacon fat. Coconut, palm kernel, or palm oils. Regular salad dressings. Other Pickles and olives. Salted popcorn and pretzels. The items listed above may not be a complete list of foods and beverages to avoid. Contact your dietitian for more information. WHERE CAN I FIND MORE INFORMATION? National Heart, Lung, and Blood Institute: travelstabloid.com Document Released: 12/13/2010 Document Revised: 05/10/2013 Document Reviewed: 10/28/2012 [facility] Patient Information 2015 [place], [place]. This information is not intended to replace advice given to you by your health care provider. Make sure you discuss any questions you have with your health care provider. Hypertension Hypertension, commonly called high blood pressure, is when the force of blood pumping through your arteries is too strong. Your arteries are the blood vessels that carry blood from your heart throughout your body. A blood pressure reading consists of a higher number over a lower number, such as 110/72. The higher number (systolic) is the pressure inside your arteries when your heart pumps. The lower number (diastolic) is the pressure inside your arteries when your heart relaxes. Ideally you want your blood pressure below 120/80. Hypertension forces your heart to work harder to pump blood. Your arteries may become narrow or stiff. Having hypertension puts you at risk for heart disease, stroke, and other problems.  RISK FACTORS Some risk factors for high blood pressure are controllable. Others are not.  Risk factors you cannot control include:   Race. You may be at higher risk if you are African American.  Age. Risk increases with age.  Gender. Men are at higher risk than women before age 82 years. After age 9, women are at higher risk than men. Risk factors you can  control include:  Not getting enough exercise or physical activity.  Being overweight.  Getting too much fat, sugar, calories, or salt in your diet.  Drinking too much alcohol. SIGNS AND SYMPTOMS Hypertension does not usually cause signs or symptoms. Extremely high blood pressure (hypertensive crisis) may cause headache, anxiety, shortness of breath, and nosebleed. DIAGNOSIS  To check if you have hypertension, your health care provider will measure your blood pressure while you are seated, with your arm held at the level of your heart. It should be measured at least twice using the same arm. Certain conditions can cause a difference in blood pressure between your right and left arms. A blood pressure reading that is higher than normal on one occasion does not mean that you need treatment. If one blood pressure reading is high, ask your health care provider about having it checked again. TREATMENT  Treating high blood pressure includes making lifestyle changes and possibly taking medicine. Living a healthy lifestyle can help lower high blood pressure. You may need to change some of your habits. Lifestyle  changes may include:  Following the DASH diet. This diet is high in fruits, vegetables, and whole grains. It is low in salt, red meat, and added sugars.  Getting at least 2 hours of brisk physical activity every week.  Losing weight if necessary.  Not smoking.  Limiting alcoholic beverages.  Learning ways to reduce stress. If lifestyle changes are not enough to get your blood pressure under control, your health care provider may prescribe medicine. You may need to take more than one. Work closely with your health care provider to understand the risks and benefits. HOME CARE INSTRUCTIONS  Have your blood pressure rechecked as directed by your health care provider.   Take medicines only as directed by your health care provider. Follow the directions carefully. Blood pressure  medicines must be taken as prescribed. The medicine does not work as well when you skip doses. Skipping doses also puts you at risk for problems.   Do not smoke.   Monitor your blood pressure at home as directed by your health care provider. SEEK MEDICAL CARE IF:   You think you are having a reaction to medicines taken.  You have recurrent headaches or feel dizzy.  You have swelling in your ankles.  You have trouble with your vision. SEEK IMMEDIATE MEDICAL CARE IF:  You develop a severe headache or confusion.  You have unusual weakness, numbness, or feel faint.  You have severe chest or abdominal pain.  You vomit repeatedly.  You have trouble breathing. MAKE SURE YOU:   Understand these instructions.  Will watch your condition.  Will get help right away if you are not doing well or get worse. Document Released: 12/24/2004 Document Revised: 05/10/2013 Document Reviewed: 10/16/2012 [facility] Patient Information 2015 [place], [place]. This information is not intended to replace advice given to you by your health care provider. Make sure you discuss any questions you have with your health care provider.

## 2014-06-23 ENCOUNTER — Ambulatory Visit (HOSPITAL_COMMUNITY)
Admission: RE | Admit: 2014-06-23 | Discharge: 2014-06-23 | Disposition: A | Discharge status: Home / Self Care | Payer: No Typology Code available for payment source | Source: Ambulatory Visit | Attending: Internal Medicine | Admitting: Internal Medicine

## 2014-06-23 DIAGNOSIS — W19XXXA Unspecified fall, initial encounter: Secondary | ICD-10-CM | POA: Insufficient documentation

## 2014-06-23 DIAGNOSIS — M25462 Effusion, left knee: Secondary | ICD-10-CM | POA: Insufficient documentation

## 2014-06-23 DIAGNOSIS — R936 Abnormal findings on diagnostic imaging of limbs: Secondary | ICD-10-CM | POA: Insufficient documentation

## 2014-06-23 DIAGNOSIS — S83222A Peripheral tear of medial meniscus, current injury, left knee, initial encounter: Secondary | ICD-10-CM | POA: Insufficient documentation

## 2014-06-23 DIAGNOSIS — M25562 Pain in left knee: Secondary | ICD-10-CM

## 2014-06-24 ENCOUNTER — Telehealth: Payer: Self-pay

## 2014-06-24 ENCOUNTER — Other Ambulatory Visit: Payer: Self-pay | Admitting: Internal Medicine

## 2014-06-24 DIAGNOSIS — S83207A Unspecified tear of unspecified meniscus, current injury, left knee, initial encounter: Secondary | ICD-10-CM

## 2014-06-24 NOTE — Telephone Encounter (Signed)
Nurse called patient at both numbers, reached voicemail. Left messages, on both numbers, for patient to call Yaritsa Savarino at [phone].

## 2014-06-24 NOTE — Telephone Encounter (Signed)
-----   Message from Tresa Garter, MD sent at 06/24/2014  9:25 AM EDT ----- Please inform patient that his left knee MRI shows a meniscal tear, we'll refer him to orthopedic surgery. Referral placed.

## 2014-06-27 NOTE — Telephone Encounter (Signed)
Patient called in to say he has an appointment with [facility] ortho on June 29th.

## 2014-07-05 ENCOUNTER — Encounter: Payer: Self-pay | Admitting: Sports Medicine

## 2014-07-05 ENCOUNTER — Ambulatory Visit (INDEPENDENT_AMBULATORY_CARE_PROVIDER_SITE_OTHER): Payer: Self-pay | Admitting: Sports Medicine

## 2014-07-05 VITALS — BP 130/83 | HR 81 | Ht 74.0 in | Wt 276.0 lb

## 2014-07-05 DIAGNOSIS — S83207A Unspecified tear of unspecified meniscus, current injury, left knee, initial encounter: Secondary | ICD-10-CM

## 2014-07-05 MED ORDER — METHYLPREDNISOLONE ACETATE 80 MG/ML IJ SUSP
80.0000 mg | Freq: Once | INTRAMUSCULAR | Status: AC
  Administered 2014-07-05: 80 mg via INTRA_ARTICULAR

## 2014-07-05 MED ORDER — MELOXICAM 15 MG PO TABS: 15.0000 mg | ORAL_TABLET | Freq: Every day | ORAL | Status: DC | PRN

## 2014-07-05 MED ORDER — HYDROCODONE-ACETAMINOPHEN 5-325 MG PO TABS: 1.0000 | ORAL_TABLET | Freq: Four times a day (QID) | ORAL | Status: DC | PRN

## 2014-07-05 NOTE — Progress Notes (Signed)
Subjective:    Patient ID: Dustin [name], male    DOB: November 07, 1960, 54 y.o.   MRN: [id number]  HPI Dustin [name] is a 54 year old male presents for evaluation of left knee pain following an injury he sustained 4 weeks ago. He describes a twisting injury. He saw his PCP on 06/16/14 and an MRI was obtained of the left knee shown below. He was prescribed Tylenol #3 and also referred to a Pain Clinic, but has been unable to get into the clinic because he says they will not accept his insurance. Location of pain is medial joint line. He notes pseudolocking, catching, and sensation of instability and giving way. He also notes intermittent swelling. He has tried wearing a cold compress knee brace with some relief. He is also taking naproxen, alternating with ibuprofen. History is significant for prior contralateral knee (right) ACL, meniscal, and MCL repair. He says his left knee feels similar to when he injured the right knee. He requests switching to meloxicam from other NSAIDs, as this has helped in the past. He also requests a short course of a stronger pain medication.  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  Past medical history, social history, medications, and allergies were reviewed and are up to date in the chart.  Review of Systems 7 point review of systems was performed and was otherwise negative unless noted in the history of present illness.     Objective:   Physical Exam BP 130/83 mmHg  Pulse 81  Ht 6\' 2"  (1.88 m)  Wt 276 lb (125.193 kg)  BMI 35.42 kg/m2 GEN: The patient is well-developed well-nourished male and in no acute distress.  He is awake alert and oriented x3. SKIN: warm and well-perfused, no rash  EXTR: No lower extremity edema or calf tenderness Neuro: Strength 5/5 globally. Sensation intact throughout. DTRs 2/4 bilaterally. No focal deficits. Vasc:  +2 bilateral distal pulses. No edema.  MSK: Examination of the left knee reveals range of motion from 0-115 degrees with pain at the endpoint of motion. Trace effusion, without overlying erythema or induration. +Medial joint line pain without gapping with valgus stress. +McMurray's. Neg lachman's. No postero-lateral corner pain to palpation. No instability.  X-rays Left Knee 06/03/14: IMPRESSION: There is no acute or healing fracture of the left knee. There are minimal osteoarthritic changes. There is a small suprapatellar Effusion.  MRI Left Knee 06/23/14: IMPRESSION: Oblique tear at the junction of the posterior horn and body of the medial meniscus reaches the meniscal undersurface. Marked blunting in the anterior aspect of the body of medial meniscus is compatible with a radial tear best seen on image 14 of series 4.  Likely adventitial bursal formation deep to the iliotibial band. Typical ill-defined soft tissue edema seen in IT band syndrome is not identified on this examination.  Small focus of been hyaline cartilage posterior weight-bearing surface lateral femoral condyle without reactive marrow edema.  Left knee CST injection Procedure:  Consent obtained and verified. Sterile betadine prep. Furthur cleansed with alcohol. Topical analgesic spray: Ethyl chloride. Joint/Bursa: Left Knee Approached in typical fashion with: Completed without difficulty Meds: 80 mg methylprednisolone and 4 cc's of 1% plain lidocaine without epinephrine Needle: 25 gauge needle Aftercare instructions and Red flags advised.     Assessment & Plan:  Please see problem based assessment and plan in the problem list.

## 2014-07-05 NOTE — Assessment & Plan Note (Addendum)
Medial meniscal tear seen on MRI as well as small focal chondral defect. No loose body. X-rays without fx and minimal joint space narrowing. He may also have a grade 1 MCL sprain. -Treatment options discussed. Patient opts for non-operative management for now. -Continue ice, compression sleeve, elevation, relative rest, avoid deep lunges -CST injection tolerated well today -Referral made for formal PT -Rx meloxicam 15mg  po qd, discontinue other NSAIDs while taking -Rx short one-time course of Norco 5/325mg  #30 tab, with instructions that no additional refills will be provided from our clinic. -He will follow-up in 1 month or sooner for repeat evaluation. If he is having ongoing pain, mechanical symptoms, swelling, requiring additional pain medication, then we discussed that this would be an indication for referral to Orthopedic Surgery for discussion about possible surgical intervention. -Instructed to call and RTC sooner if worsening pain, swelling, fevers, chills, malaise, or knee giving way. He verbalized understanding and agreement.

## 2014-07-08 ENCOUNTER — Telehealth: Payer: Self-pay

## 2014-07-08 NOTE — Telephone Encounter (Signed)
Nurse called patient, patient verified date of birth. Patient has already been seen at orthopedics on 07/05/14. Orthopedic doctor gave patient shots in knee to try not to do surgery.

## 2014-07-08 NOTE — Telephone Encounter (Signed)
-----   Message from Tresa Garter, MD sent at 06/24/2014  9:25 AM EDT ----- Please inform patient that his left knee MRI shows a meniscal tear, we'll refer him to orthopedic surgery. Referral placed.

## 2014-08-23 ENCOUNTER — Other Ambulatory Visit: Payer: Self-pay | Admitting: Internal Medicine

## 2014-08-30 NOTE — Telephone Encounter (Signed)
Patient called to request a med refill for amitriptyline (ELAVIL) 50 MG tablet. Please f/u with pt.

## 2015-01-10 ENCOUNTER — Ambulatory Visit: Payer: No Typology Code available for payment source | Attending: Internal Medicine

## 2015-01-24 ENCOUNTER — Telehealth: Payer: Self-pay | Admitting: Internal Medicine

## 2015-01-24 ENCOUNTER — Other Ambulatory Visit: Payer: Self-pay | Admitting: *Deleted

## 2015-01-24 DIAGNOSIS — I10 Essential (primary) hypertension: Secondary | ICD-10-CM

## 2015-01-24 MED ORDER — ATENOLOL 50 MG PO TABS: 50.0000 mg | ORAL_TABLET | Freq: Every day | ORAL | Status: DC

## 2015-01-24 MED ORDER — HYDROCHLOROTHIAZIDE 12.5 MG PO TABS: 12.5000 mg | ORAL_TABLET | Freq: Every day | ORAL | Status: DC

## 2015-01-24 MED FILL — HYDROCHLOROTHIAZIDE 12.5 MG: 12.5 | 30 days supply | Qty: 30 | Fill #0

## 2015-01-24 MED FILL — ?ATENOLOL 50 MG TABLET: 50 | 30 days supply | Qty: 30 | Fill #0

## 2015-01-24 NOTE — Telephone Encounter (Signed)
Pt called and verified name and Date of birth Pt reports that he cannot get his high BP under control unless he takes two doses of his medications per day. These medications are atenolol (TENORMIN) 50 MG tablet and hydrochlorothiazide (HYDRODIURIL) 12.5 MG tablet. His BP has been running around 167/107 and the lowest he can get it is 150/95. Pt reports that diet changes have little to no effect and only taking two doses has been able to help. Pt is out of medication. Cannot obtain an appt unitl February. Pt asking for a refill on these medications to help with his BP.   Please advise pt at the earliest convenience. Sadie Reynolds, ASA

## 2015-01-24 NOTE — Telephone Encounter (Signed)
Patient verified DOB Patient informed of BP medications being refilled with one refill to allow patient enough time to schedule appointment with Financial Counseling and Appointment with Dr. Doreene Burke in Feb. Patient stated he will be in on Friday to meet with Financial Counseling. Patient expressed his gratitude and had no further questions.

## 2015-02-03 MED FILL — AMITRIPTYLINE HCL 50 MG TAB: 50 | 30 days supply | Qty: 30 | Fill #4

## 2015-02-17 MED FILL — ATENOLOL 50 MG TABLET: 50 | 30 days supply | Qty: 30 | Fill #11

## 2015-02-17 MED FILL — ?HYDROCHLOROTHIAZIDE 12.5MG: 12.5 | 30 days supply | Qty: 30 | Fill #7

## 2015-03-02 ENCOUNTER — Encounter: Payer: Self-pay | Admitting: Internal Medicine

## 2015-03-02 ENCOUNTER — Ambulatory Visit: Payer: No Typology Code available for payment source | Attending: Internal Medicine | Admitting: Internal Medicine

## 2015-03-02 VITALS — BP 126/86 | HR 76 | Temp 98.1°F | Resp 16 | Ht 74.0 in | Wt 278.2 lb

## 2015-03-02 DIAGNOSIS — G894 Chronic pain syndrome: Secondary | ICD-10-CM | POA: Insufficient documentation

## 2015-03-02 DIAGNOSIS — R51 Headache: Secondary | ICD-10-CM | POA: Insufficient documentation

## 2015-03-02 DIAGNOSIS — M109 Gout, unspecified: Secondary | ICD-10-CM | POA: Insufficient documentation

## 2015-03-02 DIAGNOSIS — F17210 Nicotine dependence, cigarettes, uncomplicated: Secondary | ICD-10-CM | POA: Insufficient documentation

## 2015-03-02 DIAGNOSIS — Z79899 Other long term (current) drug therapy: Secondary | ICD-10-CM | POA: Insufficient documentation

## 2015-03-02 DIAGNOSIS — M25562 Pain in left knee: Secondary | ICD-10-CM | POA: Insufficient documentation

## 2015-03-02 DIAGNOSIS — Z7982 Long term (current) use of aspirin: Secondary | ICD-10-CM | POA: Insufficient documentation

## 2015-03-02 DIAGNOSIS — M1000 Idiopathic gout, unspecified site: Secondary | ICD-10-CM | POA: Insufficient documentation

## 2015-03-02 DIAGNOSIS — M1990 Unspecified osteoarthritis, unspecified site: Secondary | ICD-10-CM | POA: Insufficient documentation

## 2015-03-02 DIAGNOSIS — Z1211 Encounter for screening for malignant neoplasm of colon: Secondary | ICD-10-CM | POA: Insufficient documentation

## 2015-03-02 DIAGNOSIS — I10 Essential (primary) hypertension: Secondary | ICD-10-CM | POA: Insufficient documentation

## 2015-03-02 MED ORDER — AMITRIPTYLINE HCL 50 MG PO TABS: 50.0000 mg | ORAL_TABLET | Freq: Every day | ORAL | Status: DC

## 2015-03-02 MED ORDER — MELOXICAM 15 MG PO TABS: 15.0000 mg | ORAL_TABLET | Freq: Every day | ORAL | Status: DC | PRN

## 2015-03-02 MED ORDER — ATENOLOL 50 MG PO TABS: 50.0000 mg | ORAL_TABLET | Freq: Every day | ORAL | Status: DC

## 2015-03-02 MED ORDER — HYDROCHLOROTHIAZIDE 12.5 MG PO TABS: 12.5000 mg | ORAL_TABLET | Freq: Every day | ORAL | Status: DC

## 2015-03-02 MED ORDER — ACETAMINOPHEN-CODEINE #3 300-30 MG PO TABS: 1.0000 | ORAL_TABLET | Freq: Three times a day (TID) | ORAL | Status: DC | PRN

## 2015-03-02 MED FILL — AMITRIPTYLINE HCL 50 MG TAB: 50 | 30 days supply | Qty: 30 | Fill #0

## 2015-03-02 MED FILL — MELOXICAM 15 MG TABLET: 15 | 30 days supply | Qty: 30 | Fill #1

## 2015-03-02 MED FILL — ACETAMINOPHEN/COD #3 TABLET: 300-30 | 15 days supply | Qty: 90 | Fill #0

## 2015-03-02 NOTE — Progress Notes (Signed)
Dustin [name], is a 55 y.o. male  [id number]  [id number]  DOB - 03/07/1960  CC:  Chief Complaint  Patient presents with  . Hypertension        HPI: Dustin [name] is a 55 y.o. male here today to for follow up. Patient has history of gouty arthritis, hypertension, occasional headaches and osteoarthritis of the joints. Patient is here today for medication refills and with continued complaint of "constant pain all over his body". Pain has been ongoing for last several months and movement of any kind is aggravating factor. Rest gives only moderate relief. Patient states he is taking multiple NSAIDs and Acetaminophen for relief. Patient requesting referral to pain clinic. Patient states he is unable to pay for pain clinic visit and has no health insurance. Patient refuses influenzae and pneumonia vaccine. Patient has No headache, No chest pain, No abdominal pain - No Nausea, No new weakness tingling or numbness, No Cough - SOB..  No Known Allergies Past Medical History  Diagnosis Date  . Hypertension   . Arthritis   . Gout   . DDD (degenerative disc disease)    Current Outpatient Prescriptions on File Prior to Visit  Medication Sig Dispense Refill  . aspirin-acetaminophen-caffeine (EXCEDRIN MIGRAINE) 250-250-65 MG per tablet Take 2-4 tablets by mouth daily. Reported on 03/02/2015    . HYDROcodone-acetaminophen (NORCO) 5-325 MG per tablet Take 1 tablet by mouth every 6 (six) hours as needed. (Patient not taking: Reported on 03/02/2015) 30 tablet 0   No current facility-administered medications on file prior to visit.   Family History  Problem Relation Age of Onset  . Cancer Father   . Diabetes Father   . Rheum arthritis Mother    Social History   Social History  . Marital Status: Legally Separated    Spouse Name: N/A  . Number of Children: N/A  . Years of Education: N/A   Occupational History  . Not on file.   Social History Main Topics  . Smoking status: Current  Every Day Smoker -- 0.50 packs/day    Types: Cigarettes  . Smokeless tobacco: Not on file  . Alcohol Use: No     Comment: occasionally- "3 shots a month"  . Drug Use: No  . Sexual Activity:    Partners: Female   Other Topics Concern  . Not on file   Social History Narrative    Review of Systems: Constitutional: Negative for fever, chills, diaphoresis, activity change, appetite change and fatigue. HENT: Negative for ear pain, nosebleeds, congestion, facial swelling, rhinorrhea, neck pain, neck stiffness and ear discharge.  Eyes: Negative for pain, discharge, redness, itching and visual disturbance. Respiratory: Negative for cough, choking, chest tightness, shortness of breath, wheezing and stridor.  Cardiovascular: Negative for chest pain, palpitations and leg swelling. Gastrointestinal: Negative for abdominal distention. Genitourinary: Negative for dysuria, urgency, frequency, hematuria, flank pain, decreased urine volume, difficulty urinating and dyspareunia.  Musculoskeletal: Negative for back pain, joint swelling, arthralgia and gait problem. Neurological: Negative for dizziness, tremors, seizures, syncope, facial asymmetry, speech difficulty, weakness, light-headedness, numbness and headaches.  Hematological: Negative for adenopathy. Does not bruise/bleed easily. Psychiatric/Behavioral: Negative for hallucinations, behavioral problems, confusion, dysphoric mood, decreased concentration and agitation.    Objective:   Filed Vitals:   03/02/15 1232  BP: 126/86  Pulse: 76  Temp: 98.1 F (36.7 C)  Resp: 16    Physical Exam: Constitutional: Patient appears well-developed and well-nourished. No distress. HENT: Normocephalic, atraumatic, External right and left ear normal. Oropharynx is clear  and moist.  Eyes: Conjunctivae and EOM are normal. PERRLA, no scleral icterus. Neck: Normal ROM. Neck supple. No JVD. No tracheal deviation. No thyromegaly. CVS: RRR, S1/S2 +, no  murmurs, no gallops, no carotid bruit.  Pulmonary: Effort and breath sounds normal, no stridor, rhonchi, wheezes, rales.  Abdominal: Soft. BS +, no distension, tenderness, rebound or guarding.  Musculoskeletal: Normal range of motion. No edema and no tenderness.  Lymphadenopathy: No lymphadenopathy noted Neuro: Alert X Oriented 4. Skin: Skin is warm and dry. No rash noted. Not diaphoretic. No erythema. No pallor. Psychiatric: Normal mood and affect. Behavior, judgment, thought content normal.  Lab Results  Component Value Date   WBC 8.0 02/17/2014   HGB 15.8 02/17/2014   HCT 47.6 02/17/2014   MCV 93.9 02/17/2014   PLT 273 02/17/2014   Lab Results  Component Value Date   CREATININE 1.28 02/17/2014   BUN 16 02/17/2014   NA 137 02/17/2014   K 5.2 02/17/2014   CL 101 02/17/2014   CO2 28 02/17/2014    Lab Results  Component Value Date   HGBA1C 5.20 02/17/2014   Lipid Panel     Component Value Date/Time   CHOL 160 02/17/2014 1558   TRIG 321* 02/17/2014 1558   HDL 33* 02/17/2014 1558   CHOLHDL 4.8 02/17/2014 1558   VLDL 64* 02/17/2014 1558   LDLCALC 63 02/17/2014 1558       Assessment and plan:   Dustin [name] was seen today for hypertension.  Diagnoses and all orders for this visit:  Uncontrolled hypertension  -     atenolol (TENORMIN) 50 MG tablet; Take 1 tablet (50 mg total) by mouth daily. -     hydrochlorothiazide (HYDRODIURIL) 12.5 MG tablet; Take 1 tablet (12.5 mg total) by mouth daily.  We have discussed target BP range and blood pressure goal. I have advised patient to check BP regularly and to call us back or report to clinic if the numbers are consistently higher than 140/90. We discussed the importance of compliance with medical therapy and DASH diet recommended, consequences of uncontrolled hypertension discussed.   - continue current BP medications  Left knee pain  -     amitriptyline (ELAVIL) 50 MG tablet; Take 1 tablet (50 mg total) by mouth at  bedtime. -     acetaminophen-codeine (TYLENOL #3) 300-30 MG tablet; Take 1-2 tablets by mouth every 8 (eight) hours as needed for moderate pain. -     meloxicam (MOBIC) 15 MG tablet; Take 1 tablet (15 mg total) by mouth daily as needed for pain.  Chronic pain syndrome  -     acetaminophen-codeine (TYLENOL #3) 300-30 MG tablet; Take 1-2 tablets by mouth every 8 (eight) hours as needed for moderate pain. -     meloxicam (MOBIC) 15 MG tablet; Take 1 tablet (15 mg total) by mouth daily as needed for pain.  Colon cancer screening  -     Ambulatory referral to Gastroenterology  Return in about 6 months (around 08/30/2015) for Follow up Pain and comorbidities, Follow up HTN.  The patient was given clear instructions to go to ER or return to medical center if symptoms don't improve, worsen or new problems develop. The patient verbalized understanding. The patient was told to call to get lab results if they haven't heard anything in the next week.     [facility] [phone] 03/02/2015, 2:31 PM    Evaluation and management procedures were performed by the Advanced Practitioner  under my supervision and collaboration. I have reviewed the Advanced Practitioner's note and chart, and I agree with the management and plan.   Angelica Chessman, MD, [place], [place], [place], [place] [facility] [place], [place]   03/02/2015, 6:41 PM

## 2015-03-02 NOTE — Progress Notes (Signed)
Patient is here for HTN  Patient complains of pain all over.

## 2015-03-02 NOTE — Patient Instructions (Signed)

## 2015-03-03 ENCOUNTER — Encounter: Payer: Self-pay | Admitting: Clinical

## 2015-03-03 MED FILL — ATENOLOL 50 MG TABLET: 50 | 30 days supply | Qty: 30 | Fill #0

## 2015-03-03 NOTE — Progress Notes (Unsigned)
Depression screen [facility] 2/9 03/02/2015 07/05/2014 06/16/2014 06/03/2014 01/28/2013  Decreased Interest 3 0 0 0 0  Down, Depressed, Hopeless 3 0 0 0 0  PHQ - 2 Score 6 0 0 0 0  Altered sleeping 2 - - - -  Tired, decreased energy 3 - - - -  Change in appetite 1 - - - -  Feeling bad or failure about yourself  3 - - - -  Trouble concentrating 1 - - - -  Moving slowly or fidgety/restless 3 - - - -  Suicidal thoughts 1 - - - -  PHQ-9 Score 20 - - - -   *No plans to end life in last 2 weeks   GAD 7 : Generalized Anxiety Score 03/02/2015  Nervous, Anxious, on Edge 0  Control/stop worrying 1  Worry too much - different things 1  Trouble relaxing 1  Restless 0  Easily annoyed or irritable 2  Afraid - awful might happen 1  Total GAD 7 Score 6

## 2015-03-28 ENCOUNTER — Other Ambulatory Visit: Payer: Self-pay | Admitting: Internal Medicine

## 2015-03-28 MED FILL — MELOXICAM 15 MG TABLET: 15 | 30 days supply | Qty: 30 | Fill #2

## 2015-03-28 MED FILL — ATENOLOL 50 MG TABLET: 50 | 30 days supply | Qty: 30 | Fill #1

## 2015-03-28 MED FILL — ?HYDROCHLOROTHIAZIDE 12.5MG: 12.5 | 30 days supply | Qty: 30 | Fill #8

## 2015-04-10 ENCOUNTER — Other Ambulatory Visit: Payer: Self-pay | Admitting: Internal Medicine

## 2015-04-10 MED FILL — AMITRIPTYLINE HCL 50 MG TAB: 50 | 30 days supply | Qty: 30 | Fill #1

## 2015-04-12 MED FILL — ACETAMINOPHEN/COD #3 TABLET: 300-30 | 15 days supply | Qty: 90 | Fill #0

## 2015-04-26 MED FILL — ATENOLOL 50 MG TABLET: 50 | 30 days supply | Qty: 30 | Fill #2

## 2015-04-26 MED FILL — ?HYDROCHLOROTHIAZIDE 12.5MG: 12.5 | 30 days supply | Qty: 30 | Fill #9

## 2015-04-27 MED FILL — MELOXICAM 15 MG TABLET: 15 | 30 days supply | Qty: 30 | Fill #0

## 2015-05-22 MED FILL — HYDROCHLOROTHIAZIDE 12.5 MG: 12.5 | 30 days supply | Qty: 30 | Fill #10

## 2015-05-22 MED FILL — ATENOLOL 50 MG TABLET: 50 | 30 days supply | Qty: 30 | Fill #3

## 2015-05-22 MED FILL — MELOXICAM 15 MG TABLET: 15 | 30 days supply | Qty: 30 | Fill #1

## 2015-05-25 MED FILL — AMITRIPTYLINE HCL 50 MG TAB: 50 | 30 days supply | Qty: 30 | Fill #2

## 2015-06-19 ENCOUNTER — Telehealth: Payer: Self-pay | Admitting: Internal Medicine

## 2015-06-19 MED FILL — ATENOLOL 50 MG TABLET: 50 | 30 days supply | Qty: 30 | Fill #4

## 2015-06-19 MED FILL — HYDROCHLOROTHIAZIDE 12.5 MG: 12.5 | 30 days supply | Qty: 30 | Fill #0

## 2015-06-19 NOTE — Telephone Encounter (Signed)
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 Please follow up.

## 2015-06-22 NOTE — Telephone Encounter (Signed)
Patient dropped off paperwork to continue being eligible for food stamps. Patient needs paperwork by Monday 6/19. Please follow up.

## 2015-06-23 NOTE — Telephone Encounter (Signed)
Pt. Called stating that he dropped off paperwork yesterday and he spoke with his  PCP personally. Pt. Stated that PCP would have the paperwork ready for today. Please f/u with pt.

## 2015-06-26 MED FILL — AMITRIPTYLINE HCL 50 MG TAB: 50 | 30 days supply | Qty: 30 | Fill #3

## 2015-06-28 ENCOUNTER — Ambulatory Visit: Payer: No Typology Code available for payment source

## 2015-06-29 NOTE — Telephone Encounter (Signed)
Patient is aware of paperwork being completed and placed at the front desk for pick up.

## 2015-07-14 MED FILL — ?HYDROCHLOROTHIAZIDE 12.5MG: 12.5 | 30 days supply | Qty: 30 | Fill #1

## 2015-07-17 MED FILL — ?ATENOLOL 25 MG TABLET: 25 | 30 days supply | Qty: 60 | Fill #0

## 2015-08-01 MED FILL — ?AMITRIPTYLINE HCL 50MG TAB: 50 | 30 days supply | Qty: 30 | Fill #4

## 2015-08-23 MED FILL — ATENOLOL 25 MG TABLET: 25 | 30 days supply | Qty: 60 | Fill #1

## 2015-08-23 MED FILL — ?HYDROCHLOROTHIAZIDE 12.5MG: 12.5 | 30 days supply | Qty: 30 | Fill #2

## 2015-10-04 MED FILL — ATENOLOL 50 MG TABLET: 50 | 30 days supply | Qty: 30 | Fill #5

## 2015-10-04 MED FILL — ?AMITRIPTYLINE HCL 50MG TAB: 50 | 30 days supply | Qty: 30 | Fill #5

## 2015-10-04 MED FILL — ?HYDROCHLOROTHIAZIDE 12.5MG: 12.5 | 30 days supply | Qty: 30 | Fill #3

## 2015-10-16 ENCOUNTER — Ambulatory Visit: Payer: No Typology Code available for payment source

## 2015-11-07 MED FILL — ATENOLOL 50 MG TABLET: 50 | 30 days supply | Qty: 30 | Fill #6

## 2015-11-07 MED FILL — ?AMITRIPTYLINE HCL 50MG TAB: 50 | 30 days supply | Qty: 30 | Fill #6

## 2015-11-07 MED FILL — ?HYDROCHLOROTHIAZIDE 12.5 M: 12.5 | 30 days supply | Qty: 30 | Fill #4

## 2015-12-11 MED FILL — ?HYDROCHLOROTHIAZIDE 12.5 M: 12.5 | 30 days supply | Qty: 30 | Fill #5

## 2015-12-14 ENCOUNTER — Other Ambulatory Visit: Payer: Self-pay | Admitting: Pharmacist

## 2015-12-14 MED ORDER — METOPROLOL SUCCINATE ER 25 MG PO TB24: 25.0000 mg | ORAL_TABLET | Freq: Every day | ORAL | 0 refills | Status: DC

## 2015-12-14 MED FILL — METOPROLOL SUCC ER 25 MG TA: 25 | 30 days supply | Qty: 30 | Fill #0

## 2016-01-15 ENCOUNTER — Emergency Department (HOSPITAL_COMMUNITY): Payer: Self-pay

## 2016-01-15 ENCOUNTER — Encounter (HOSPITAL_COMMUNITY): Payer: Self-pay

## 2016-01-15 ENCOUNTER — Emergency Department (HOSPITAL_COMMUNITY)
Admission: EM | Admit: 2016-01-15 | Discharge: 2016-01-16 | Disposition: A | Discharge status: Home / Self Care | Payer: Self-pay | Attending: Emergency Medicine | Admitting: Emergency Medicine

## 2016-01-15 DIAGNOSIS — M5137 Other intervertebral disc degeneration, lumbosacral region: Secondary | ICD-10-CM | POA: Insufficient documentation

## 2016-01-15 DIAGNOSIS — F17210 Nicotine dependence, cigarettes, uncomplicated: Secondary | ICD-10-CM | POA: Insufficient documentation

## 2016-01-15 DIAGNOSIS — I10 Essential (primary) hypertension: Secondary | ICD-10-CM | POA: Insufficient documentation

## 2016-01-15 DIAGNOSIS — Z79899 Other long term (current) drug therapy: Secondary | ICD-10-CM | POA: Insufficient documentation

## 2016-01-15 DIAGNOSIS — R29898 Other symptoms and signs involving the musculoskeletal system: Secondary | ICD-10-CM

## 2016-01-15 DIAGNOSIS — S83207A Unspecified tear of unspecified meniscus, current injury, left knee, initial encounter: Secondary | ICD-10-CM

## 2016-01-15 DIAGNOSIS — M62838 Other muscle spasm: Secondary | ICD-10-CM | POA: Insufficient documentation

## 2016-01-15 LAB — URINALYSIS, ROUTINE W REFLEX MICROSCOPIC
BILIRUBIN URINE: NEGATIVE
GLUCOSE, UA: NEGATIVE mg/dL
Hgb urine dipstick: NEGATIVE
Ketones, ur: NEGATIVE mg/dL
Leukocytes, UA: NEGATIVE
Nitrite: NEGATIVE
PH: 5 (ref 5.0–8.0)
Protein, ur: NEGATIVE mg/dL
Specific Gravity, Urine: 1.012 (ref 1.005–1.030)

## 2016-01-15 MED ORDER — METHOCARBAMOL 1000 MG/10ML IJ SOLN
1000.0000 mg | Freq: Once | INTRAMUSCULAR | Status: DC
  Filled 2016-01-15: qty 10

## 2016-01-15 MED ORDER — SODIUM CHLORIDE 0.9 % IV SOLN
Freq: Once | INTRAVENOUS | Status: AC
  Administered 2016-01-15: 21:00:00 via INTRAVENOUS

## 2016-01-15 MED ORDER — HYDROMORPHONE HCL 1 MG/ML IJ SOLN
1.0000 mg | Freq: Once | INTRAMUSCULAR | Status: DC
  Filled 2016-01-15: qty 1

## 2016-01-15 MED ORDER — HYDROMORPHONE HCL 1 MG/ML IJ SOLN
1.0000 mg | Freq: Once | INTRAMUSCULAR | Status: AC
  Administered 2016-01-15: 1 mg via INTRAVENOUS
  Filled 2016-01-15: qty 1

## 2016-01-15 MED ORDER — LORAZEPAM 2 MG/ML IJ SOLN
1.0000 mg | Freq: Once | INTRAMUSCULAR | Status: AC
  Administered 2016-01-15: 1 mg via INTRAVENOUS
  Filled 2016-01-15: qty 1

## 2016-01-15 MED ORDER — HYDROMORPHONE HCL 2 MG/ML IJ SOLN
1.0000 mg | Freq: Once | INTRAMUSCULAR | Status: AC
  Administered 2016-01-15: 1 mg via INTRAVENOUS
  Filled 2016-01-15: qty 1

## 2016-01-15 MED ORDER — DEXTROSE 5 % IV SOLN
1000.0000 mg | Freq: Once | INTRAVENOUS | Status: AC
  Administered 2016-01-15: 1000 mg via INTRAVENOUS
  Filled 2016-01-15: qty 10

## 2016-01-15 NOTE — ED Provider Notes (Signed)
[place] DEPT Provider Note   CSN: [id number] Arrival date & time: 01/15/16  1214  By signing my name below, I, Macon Large, attest that this documentation has been prepared under the direction and in the presence of  Junius Creamer, NP. Electronically Signed: Macon Large, ED Scribe. 01/15/16. 8:17 PM.  History   Chief Complaint Chief Complaint  Patient presents with  . Back Pain   The history is provided by the patient. No language interpreter was used.   HPI Comments: Dustin [name] is a 56 y.o. male with PMHx of HTN, arthritis, gout and DDD who presents to the Emergency Department complaining of moderate, gradually worsening, back pain onset a week ago. Pt denies recent trauma, injury, heavy lifting, falls, twisting, bending. Pt is usually ambulatory with minimal difficulty using a walker. Pt states he has had constant back pain for the past two months, but notes it has gotten worse this past week. He notes his pain is different today than his normal baseline. He describes his pain as a pinching sensation. Pt states every muscle around his hip radiating down to his bilateral thighs and knees feel like "they are being torn or stretched", worse on the right. He denies muscle cramps but states he is having muscle spasms. He states his pain is exacerbated when stretching his legs and with ambulation. Pt reports pain is decreased when he is sitting in his recliner chair. Per pt, his lumbar region vertebrae have degenerative disks. Pt also reports his blood pressure is higher than normal. Denies bowel or bladder incontinence, saddle anesthesia, fever, cough, abdominal pain, nausea, emesis, dysuria, hematuria, frequency, rash. Denies numbness, focal weakness or paresthesia of the lower extremities. NKDA.   Past Medical History:  Diagnosis Date  . Arthritis   . DDD (degenerative disc disease)   . Gout   . Hypertension     Patient Active Problem List   Diagnosis Date Noted  .  Colon cancer screening 03/02/2015  . Acute meniscal tear of left knee 07/05/2014  . Left knee pain 06/16/2014  . Uncontrolled hypertension 06/16/2014  . Preventative health care 02/17/2014  . Other headache syndrome 02/17/2014  . Chronic pain syndrome 02/17/2014  . Medication overuse headache 05/06/2013  . Essential hypertension, benign 04/06/2013  . Dental caries 04/06/2013  . Headache(784.0) 04/06/2013    Past Surgical History:  Procedure Laterality Date  . ANTERIOR CRUCIATE LIGAMENT REPAIR    . NECK SURGERY         Home Medications    Prior to Admission medications   Medication Sig Start Date End Date Taking? Authorizing Provider  acetaminophen (TYLENOL) 500 MG tablet Take 1,000 mg by mouth every 6 (six) hours as needed for mild pain, moderate pain or headache.   Yes Historical Provider, MD  amitriptyline (ELAVIL) 50 MG tablet Take 1 tablet (50 mg total) by mouth at bedtime. Patient taking differently: Take 50 mg by mouth at bedtime as needed for sleep.  03/02/15  Yes Tresa Garter, MD  hydrochlorothiazide (HYDRODIURIL) 12.5 MG tablet Take 1 tablet (12.5 mg total) by mouth daily. Patient taking differently: Take 12.5 mg by mouth every morning.  03/02/15  Yes Tresa Garter, MD  ibuprofen (ADVIL,MOTRIN) 200 MG tablet Take 400 mg by mouth every 6 (six) hours as needed for headache, mild pain or moderate pain.   Yes Historical Provider, MD  metoprolol succinate (TOPROL-XL) 25 MG 24 hr tablet Take 1 tablet (25 mg total) by mouth daily. Patient taking differently: Take 25  mg by mouth every morning.  12/14/15  Yes Olugbemiga Essie Christine, MD  acetaminophen-codeine (TYLENOL #3) 300-30 MG tablet TAKE 1-2 TABLET BY MOUTH EVERY 8 HOURS AS NEEDED FOR MODERATE PAIN Patient not taking: Reported on 01/15/2016 03/31/15   Tresa Garter, MD  atenolol (TENORMIN) 50 MG tablet Take 1 tablet (50 mg total) by mouth daily. Patient not taking: Reported on 01/15/2016 03/02/15   Tresa Garter, MD  HYDROcodone-acetaminophen (NORCO) 5-325 MG tablet Take 1 tablet by mouth every 6 (six) hours as needed. 01/16/16   Junius Creamer, NP  meloxicam (MOBIC) 15 MG tablet Take 1 tablet (15 mg total) by mouth daily as needed for pain. Patient not taking: Reported on 01/15/2016 03/02/15   Tresa Garter, MD  methocarbamol (ROBAXIN) 500 MG tablet Take 1 tablet (500 mg total) by mouth 3 (three) times daily. 01/16/16   Junius Creamer, NP  predniSONE (DELTASONE) 20 MG tablet 3 Tabs PO Days 1-3, then 2 tabs PO Days 4-6, then 1 tab PO Day 7-9, then Half Tab PO Day 10-12 01/16/16   Junius Creamer, NP    Family History Family History  Problem Relation Age of Onset  . Rheum arthritis Mother   . Cancer Father   . Diabetes Father     Social History Social History  Substance Use Topics  . Smoking status: Current Every Day Smoker    Packs/day: 0.50    Types: Cigarettes  . Smokeless tobacco: Never Used  . Alcohol use No     Comment: occasionally- "3 shots a month"     Allergies   Patient has no known allergies.   Review of Systems Review of Systems  Constitutional: Negative for fever.  Respiratory: Negative for cough.   Gastrointestinal: Negative for abdominal pain, nausea and vomiting.  Genitourinary: Negative for dysuria, frequency and hematuria.  Musculoskeletal: Positive for arthralgias and back pain. Negative for joint swelling.  Skin: Negative for rash and wound.  Neurological: Negative for weakness and numbness.  All other systems reviewed and are negative.    Physical Exam Updated Vital Signs BP 147/95 (BP Location: Right Arm)   Pulse 81   Temp 97.5 F (36.4 C) (Oral)   Resp 20   Ht 6' (1.829 m)   Wt 117.9 kg   SpO2 98%   BMI 35.26 kg/m   Physical Exam  Constitutional: He is oriented to person, place, and time. He appears well-developed and well-nourished. No distress.  HENT:  Head: Normocephalic.  Eyes: Pupils are equal, round, and reactive to light.  Neck: Normal  range of motion.  Cardiovascular: Normal rate and regular rhythm.   Pulmonary/Chest: Effort normal and breath sounds normal.  Abdominal: Soft. Bowel sounds are normal. He exhibits no distension. There is no tenderness.  Musculoskeletal: He exhibits no tenderness or deformity.       Lumbar back: He exhibits pain and spasm. He exhibits no swelling, no edema and no deformity.       Back:       Right upper leg: He exhibits no tenderness, no swelling, no edema and no deformity.       Legs: Neurological: He is alert and oriented to person, place, and time.  Skin: Skin is warm.  Psychiatric: He has a normal mood and affect.  Nursing note and vitals reviewed.    ED Treatments / Results   DIAGNOSTIC STUDIES: Oxygen Saturation is 100% on RA, normal by my interpretation.    COORDINATION OF CARE: 8:07 PM Discussed treatment  plan with pt at bedside which includes pain medication and lumbar spine imaging and pt agreed to plan.   Labs (all labs ordered are listed, but only abnormal results are displayed) Labs Reviewed  URINALYSIS, ROUTINE W REFLEX MICROSCOPIC    EKG  EKG Interpretation None       Radiology Mr Lumbar Spine Wo Contrast  Result Date: 01/16/2016 CLINICAL DATA:  Back pain EXAM: MRI LUMBAR SPINE WITHOUT CONTRAST TECHNIQUE: Multiplanar, multisequence MR imaging of the lumbar spine was performed. No intravenous contrast was administered. COMPARISON:  Lumbar spine MRI 10/02/2006 FINDINGS: Segmentation: Normal. The lowest disc space is considered to be L5-S1. Alignment:  Normal Vertebrae: No acute compression fracture, discitis-osteomyelitis, facet edema or other focal marrow lesion. No epidural collection. Conus medullaris: Extends to the T12-L1 level and appears normal. Paraspinal and other soft tissues: The visualized aorta, IVC and iliac vessels are normal. The visualized retroperitoneal organs and paraspinal soft tissues are normal. Disc levels: T12-L1: Normal disc space and  facets. No spinal canal or neuroforaminal stenosis. L1-L2: Disc desiccation and small bulge with endplate osteophytosis. No spinal canal stenosis. Mild right foraminal narrowing. L2-L3: Disc desiccation and left eccentric bulge with narrowing of the left lateral recess. No spinal canal stenosis. Mild left foraminal narrowing. L3-L4: Disc desiccation with left eccentric bulge. No spinal canal stenosis. Moderate left foraminal stenosis. L4-L5: Right subarticular disc extrusion with mild superior migration. Mild bilateral facet hypertrophy. There is narrowing of the right lateral recess without central spinal canal stenosis. Moderate right neural foraminal stenosis secondary to combination of facet and endplate degeneration. L5-S1: Disc desiccation with diffuse bulge. No spinal canal stenosis. Unchanged endplate spurring contributing to moderate bilateral foraminal narrowing. IMPRESSION: 1. New right subarticular L4-L5 disc extrusion with mild superior migration and resultant narrowing of the right lateral recess. Correlate for right L5 radiculopathy. Moderate right foraminal stenosis at this level also may contribute to a right L4 radiculopathy. 2. Bilateral moderate neural foraminal narrowing at L5-S1, unchanged. 3. Unchanged moderate left L3-L4 neural foraminal narrowing. Electronically Signed   By: Ulyses Jarred M.D.   On: 01/16/2016 00:12    Procedures Procedures (including critical care time)  Medications Ordered in ED Medications  0.9 %  sodium chloride infusion ( Intravenous New Bag/Given 01/15/16 2108)  HYDROmorphone (DILAUDID) injection 1 mg (1 mg Intravenous Given 01/15/16 2109)  methocarbamol (ROBAXIN) 1,000 mg in dextrose 5 % 50 mL IVPB (0 mg Intravenous Stopped 01/15/16 2218)  HYDROmorphone (DILAUDID) injection 1 mg (1 mg Intravenous Given 01/15/16 2222)  HYDROmorphone (DILAUDID) injection 1 mg (1 mg Intravenous Given 01/15/16 2301)  LORazepam (ATIVAN) injection 1 mg (1 mg Intravenous Given 01/15/16  2300)  methylPREDNISolone sodium succinate (SOLU-MEDROL) 125 mg/2 mL injection 125 mg (125 mg Intravenous Given 01/16/16 0048)     Initial Impression / Assessment and Plan / ED Course  I have reviewed the triage vital signs and the nursing notes.  Pertinent labs & imaging results that were available during my care of the patient were reviewed by me and considered in my medical decision making (see chart for details).  Clinical Course   will give IV Dilaudid, Robaxin SoluMederol  order MRI LS spine  Some progression of DDD noted on MRI will DC home with Prednisone taper and muscle  relaxer     Final Clinical Impressions(s) / ED Diagnoses   Final diagnoses:  Right leg weakness  DDD (degenerative disc disease), lumbosacral  Muscle spasm of right leg    New Prescriptions New Prescriptions  METHOCARBAMOL (ROBAXIN) 500 MG TABLET    Take 1 tablet (500 mg total) by mouth 3 (three) times daily.   PREDNISONE (DELTASONE) 20 MG TABLET    3 Tabs PO Days 1-3, then 2 tabs PO Days 4-6, then 1 tab PO Day 7-9, then Half Tab PO Day 10-12    I personally performed the services described in this documentation, which was scribed in my presence. The recorded information has been reviewed and is accurate.    Junius Creamer, NP 01/16/16 0126    Junius Creamer, NP 01/16/16 [id number]    Gareth Morgan, MD 01/17/16 1249

## 2016-01-15 NOTE — ED Triage Notes (Signed)
PT C/O LOWER BACK PAIN RADIATING DOWN BOTH LEGS WORSE ON THE RIGHT X 2MONTHS. PT STS HE HAS DJD, BUT THIS PAIN HAS GOTTEN WORSE. HE STS HE IS UNABLE TO STAND ONT HE RIGHT LEG. DENIES INJURY, URINARY SYMPTOMS, NUMBNESS, OR TINGLING.

## 2016-01-15 NOTE — ED Notes (Signed)
Pt transported with NT to MRI.

## 2016-01-15 NOTE — ED Notes (Signed)
Pt is in  MRI and unable to complete the exam due to pain and muscle spasms. Obtain new order for pain medication and help to assist to relax. MRI will attempt to scan again.

## 2016-01-16 MED ORDER — PREDNISONE 20 MG PO TABS
ORAL_TABLET | ORAL | 0 refills | Status: DC
Start: 2016-01-16 — End: 2016-01-31

## 2016-01-16 MED ORDER — HYDROCODONE-ACETAMINOPHEN 5-325 MG PO TABS: 1.0000 | ORAL_TABLET | Freq: Four times a day (QID) | ORAL | 0 refills | Status: DC | PRN

## 2016-01-16 MED ORDER — METHYLPREDNISOLONE SODIUM SUCC 125 MG IJ SOLR
125.0000 mg | Freq: Once | INTRAMUSCULAR | Status: AC
  Administered 2016-01-16: 125 mg via INTRAVENOUS
  Filled 2016-01-16: qty 2

## 2016-01-16 MED ORDER — METHOCARBAMOL 500 MG PO TABS: 500.0000 mg | ORAL_TABLET | Freq: Three times a day (TID) | ORAL | 0 refills | Status: DC

## 2016-01-16 NOTE — Discharge Instructions (Signed)
Your MRI show some progression of your disc disease You have been given prescriptions for  steroids, muscle relaxer and pain control Please call DR. Yates for evaluation

## 2016-01-29 ENCOUNTER — Other Ambulatory Visit: Payer: Self-pay | Admitting: Internal Medicine

## 2016-01-29 ENCOUNTER — Telehealth: Payer: Self-pay | Admitting: Internal Medicine

## 2016-01-29 MED ORDER — METOPROLOL SUCCINATE ER 25 MG PO TB24: 25.0000 mg | ORAL_TABLET | Freq: Every day | ORAL | 0 refills | Status: DC

## 2016-01-29 MED FILL — ?HYDROCHLOROTHIAZIDE 12.5 M: 12.5 | 30 days supply | Qty: 30 | Fill #6

## 2016-01-29 MED FILL — ?AMITRIPTYLINE HCL 50MG TAB: 50 | 30 days supply | Qty: 30 | Fill #7

## 2016-01-29 MED FILL — METOPROLOL SUCC ER 25 MG TA: 25 | 30 days supply | Qty: 30 | Fill #0

## 2016-01-29 NOTE — Telephone Encounter (Signed)
Metoprolol refilled.

## 2016-01-29 NOTE — Telephone Encounter (Signed)
Medication Refill:  metoprolol succinate (TOPROL-XL) 25 MG 24 hr tablet   Pt has appointment with PCP scheduled 1/24. Requesting refill so he is able to pick up Rx at time of visit. States he has been out for over a week

## 2016-01-31 ENCOUNTER — Encounter: Payer: Self-pay | Admitting: Internal Medicine

## 2016-01-31 ENCOUNTER — Ambulatory Visit: Payer: Self-pay | Attending: Internal Medicine | Admitting: Internal Medicine

## 2016-01-31 VITALS — BP 141/87 | HR 67 | Temp 98.5°F | Resp 18 | Ht 72.0 in | Wt 271.0 lb

## 2016-01-31 DIAGNOSIS — R51 Headache: Secondary | ICD-10-CM | POA: Insufficient documentation

## 2016-01-31 DIAGNOSIS — Z79899 Other long term (current) drug therapy: Secondary | ICD-10-CM | POA: Insufficient documentation

## 2016-01-31 DIAGNOSIS — M109 Gout, unspecified: Secondary | ICD-10-CM | POA: Insufficient documentation

## 2016-01-31 DIAGNOSIS — M51379 Other intervertebral disc degeneration, lumbosacral region without mention of lumbar back pain or lower extremity pain: Secondary | ICD-10-CM | POA: Insufficient documentation

## 2016-01-31 DIAGNOSIS — M5137 Other intervertebral disc degeneration, lumbosacral region: Secondary | ICD-10-CM | POA: Insufficient documentation

## 2016-01-31 DIAGNOSIS — G894 Chronic pain syndrome: Secondary | ICD-10-CM | POA: Insufficient documentation

## 2016-01-31 DIAGNOSIS — I10 Essential (primary) hypertension: Secondary | ICD-10-CM | POA: Insufficient documentation

## 2016-01-31 DIAGNOSIS — M5126 Other intervertebral disc displacement, lumbar region: Secondary | ICD-10-CM | POA: Insufficient documentation

## 2016-01-31 LAB — CBC WITH DIFFERENTIAL/PLATELET
Basophils Absolute: 107 cells/uL (ref 0–200)
Basophils Relative: 1 %
Eosinophils Absolute: 214 cells/uL (ref 15–500)
Eosinophils Relative: 2 %
HEMATOCRIT: 47.9 % (ref 38.5–50.0)
Hemoglobin: 16.1 g/dL (ref 13.2–17.1)
LYMPHS PCT: 28 %
Lymphs Abs: 2996 cells/uL (ref 850–3900)
MCH: 31.3 pg (ref 27.0–33.0)
MCHC: 33.6 g/dL (ref 32.0–36.0)
MCV: 93.2 fL (ref 80.0–100.0)
MONO ABS: 642 {cells}/uL (ref 200–950)
MONOS PCT: 6 %
MPV: 9.6 fL (ref 7.5–12.5)
NEUTROS PCT: 63 %
Neutro Abs: 6741 cells/uL (ref 1500–7800)
Platelets: 317 10*3/uL (ref 140–400)
RBC: 5.14 MIL/uL (ref 4.20–5.80)
RDW: 13.1 % (ref 11.0–15.0)
WBC: 10.7 10*3/uL (ref 3.8–10.8)

## 2016-01-31 LAB — COMPLETE METABOLIC PANEL WITH GFR
ALT: 28 U/L (ref 9–46)
AST: 19 U/L (ref 10–35)
Albumin: 4 g/dL (ref 3.6–5.1)
Alkaline Phosphatase: 51 U/L (ref 40–115)
BUN: 20 mg/dL (ref 7–25)
CALCIUM: 9.6 mg/dL (ref 8.6–10.3)
CHLORIDE: 106 mmol/L (ref 98–110)
CO2: 23 mmol/L (ref 20–31)
Creat: 1.18 mg/dL (ref 0.70–1.33)
GFR, Est African American: 81 mL/min (ref >=60)
GFR, Est Non African American: 70 mL/min (ref >=60)
Glucose, Bld: 90 mg/dL (ref 65–99)
POTASSIUM: 4.9 mmol/L (ref 3.5–5.3)
Sodium: 137 mmol/L (ref 135–146)
Total Bilirubin: 0.4 mg/dL (ref 0.2–1.2)
Total Protein: 6.7 g/dL (ref 6.1–8.1)

## 2016-01-31 LAB — LIPID PANEL
CHOL/HDL RATIO: 4 ratio (ref <5.0)
CHOLESTEROL: 162 mg/dL (ref <200)
HDL: 41 mg/dL (ref >40)
LDL Cholesterol: 88 mg/dL (ref <100)
TRIGLYCERIDES: 164 mg/dL — AB (ref <150)
VLDL: 33 mg/dL — AB (ref <30)

## 2016-01-31 MED ORDER — ACETAMINOPHEN-CODEINE #3 300-30 MG PO TABS: 1.0000 | ORAL_TABLET | Freq: Four times a day (QID) | ORAL | 0 refills | Status: DC | PRN

## 2016-01-31 MED ORDER — HYDROCHLOROTHIAZIDE 12.5 MG PO TABS: 12.5000 mg | ORAL_TABLET | Freq: Every day | ORAL | 3 refills | Status: DC

## 2016-01-31 MED ORDER — METOPROLOL SUCCINATE ER 25 MG PO TB24: 25.0000 mg | ORAL_TABLET | Freq: Every day | ORAL | 3 refills | Status: DC

## 2016-01-31 MED ORDER — MELOXICAM 15 MG PO TABS: 15.0000 mg | ORAL_TABLET | Freq: Every day | ORAL | 3 refills | Status: DC | PRN

## 2016-01-31 MED ORDER — AMITRIPTYLINE HCL 50 MG PO TABS: 50.0000 mg | ORAL_TABLET | Freq: Every evening | ORAL | 3 refills | Status: DC | PRN

## 2016-01-31 MED FILL — ACETAMINOPHEN/COD #3 TABLET: 300-30 | 22 days supply | Qty: 90 | Fill #0

## 2016-01-31 MED FILL — MELOXICAM 15 MG TABLET: 15 | 30 days supply | Qty: 30 | Fill #0

## 2016-01-31 NOTE — Progress Notes (Signed)
Patient is here for MED REFILL  Patient complains of lower back pain radiating down the back and front of legs. Pain is scaled at an 8 currently.  Patient has taken medication today. Patient has not eaten today.  Patient request refills on pain medication and amitriptyline.  Patient denies any suicidal ideations at this time.

## 2016-01-31 NOTE — Progress Notes (Signed)
Dustin [name], is a 56 y.o. male  [id number]  [id number]  DOB - January 09, 1960  Chief Complaint  Patient presents with  . Medication Refill      Subjective:   Dustin [name] is a 56 y.o. male with history of gouty arthritis, hypertension, occasional headaches and osteoarthritis of the joints here today for a follow up visit. Patient has not been seen here for almost a year. He is complaining of ongoing severe low back pain from DDD. Most recent MRI showed "New right subarticular L4-L5 disc extrusion with mild superior migration and resultant narrowing of the right lateral recess. Correlate for right L5 radiculopathy. Moderate right foraminal stenosis at this level also may contribute to a right L4 radiculopathy. Bilateral moderate neural foraminal narrowing at L5-S1, unchanged". He walks with a walker to maintain his balance. He denies any urinary or fecal incontinence. Denies depression or suicidal ideation or thoughts. He needs refill of his medications. Patient has No headache, No chest pain, No abdominal pain - No Nausea, No new weakness tingling or numbness, No Cough - SOB.  Problem  Ddd (Degenerative Disc Disease), Lumbosacral    ALLERGIES: No Known Allergies  PAST MEDICAL HISTORY: Past Medical History:  Diagnosis Date  . Arthritis   . DDD (degenerative disc disease)   . Gout   . Hypertension     MEDICATIONS AT HOME: Prior to Admission medications   Medication Sig Start Date End Date Taking? Authorizing Provider  acetaminophen (TYLENOL) 500 MG tablet Take 1,000 mg by mouth every 6 (six) hours as needed for mild pain, moderate pain or headache.   Yes Historical Provider, MD  amitriptyline (ELAVIL) 50 MG tablet Take 1 tablet (50 mg total) by mouth at bedtime as needed for sleep. 01/31/16  Yes Tresa Garter, MD  atenolol (TENORMIN) 50 MG tablet Take 1 tablet (50 mg total) by mouth daily. 03/02/15  Yes Tresa Garter, MD  hydrochlorothiazide (HYDRODIURIL) 12.5  MG tablet Take 1 tablet (12.5 mg total) by mouth daily. 01/31/16  Yes Tresa Garter, MD  ibuprofen (ADVIL,MOTRIN) 200 MG tablet Take 400 mg by mouth every 6 (six) hours as needed for headache, mild pain or moderate pain.   Yes Historical Provider, MD  methocarbamol (ROBAXIN) 500 MG tablet Take 1 tablet (500 mg total) by mouth 3 (three) times daily. 01/16/16  Yes Junius Creamer, NP  metoprolol succinate (TOPROL-XL) 25 MG 24 hr tablet Take 1 tablet (25 mg total) by mouth daily. 01/31/16  Yes Tresa Garter, MD  acetaminophen-codeine (TYLENOL #3) 300-30 MG tablet Take 1 tablet by mouth every 6 (six) hours as needed for moderate pain. 01/31/16   Tresa Garter, MD  meloxicam (MOBIC) 15 MG tablet Take 1 tablet (15 mg total) by mouth daily as needed for pain. 01/31/16   Tresa Garter, MD    Objective:   Vitals:   01/31/16 0937  BP: (!) 141/87  Pulse: 67  Resp: 18  Temp: 98.5 F (36.9 C)  TempSrc: Oral  SpO2: 98%  Weight: 271 lb (122.9 kg)  Height: 6' (1.829 m)   Exam General appearance : Awake, alert, not in any distress. Speech Clear. Not toxic looking, uses walker to ambulate HEENT: Atraumatic and Normocephalic, pupils equally reactive to light and accomodation Neck: Supple, no JVD. No cervical lymphadenopathy.  Chest: Good air entry bilaterally, no added sounds  CVS: S1 S2 regular, no murmurs.  Abdomen: Bowel sounds present, Non tender and not distended with no gaurding, rigidity or rebound.  Extremities: B/L Lower Ext shows no edema, both legs are warm to touch Neurology: Awake alert, and oriented X 3, CN II-XII intact, Non focal Skin: No Rash  Data Review Lab Results  Component Value Date   HGBA1C 5.20 02/17/2014   HGBA1C 5.1 01/28/2013    Assessment & Plan   1. Chronic pain syndrome  - meloxicam (MOBIC) 15 MG tablet; Take 1 tablet (15 mg total) by mouth daily as needed for pain.  Dispense: 90 tablet; Refill: 3 - Ambulatory referral to Orthopedic Surgery  2.  Essential hypertension, benign  - metoprolol succinate (TOPROL-XL) 25 MG 24 hr tablet; Take 1 tablet (25 mg total) by mouth daily.  Dispense: 90 tablet; Refill: 3 - hydrochlorothiazide (HYDRODIURIL) 12.5 MG tablet; Take 1 tablet (12.5 mg total) by mouth daily.  Dispense: 90 tablet; Refill: 3  3. DDD (degenerative disc disease), lumbosacral  - meloxicam (MOBIC) 15 MG tablet; Take 1 tablet (15 mg total) by mouth daily as needed for pain.  Dispense: 90 tablet; Refill: 3 - amitriptyline (ELAVIL) 50 MG tablet; Take 1 tablet (50 mg total) by mouth at bedtime as needed for sleep.  Dispense: 90 tablet; Refill: 3 - acetaminophen-codeine (TYLENOL #3) 300-30 MG tablet; Take 1 tablet by mouth every 6 (six) hours as needed for moderate pain.  Dispense: 90 tablet; Refill: 0 - Ambulatory referral to Orthopedic Surgery  Patient have been counseled extensively about nutrition and exercise. Other issues discussed during this visit include: low cholesterol diet, weight control and daily exercise, importance of adherence with medications and regular follow-up. We also discussed long term complications of uncontrolled hypertension.   Return in about 3 months (around 04/30/2016) for Follow up Pain and comorbidities, Follow up HTN.  The patient was given clear instructions to go to ER or return to medical center if symptoms don't improve, worsen or new problems develop. The patient verbalized understanding. The patient was told to call to get lab results if they haven't heard anything in the next week.   This note has been created with Surveyor, quantity. Any transcriptional errors are unintentional.    Angelica Chessman, MD, [place], Karilyn Cota, [place] and [place], [place]   01/31/2016, 10:45 AM

## 2016-01-31 NOTE — Patient Instructions (Addendum)
Back Pain, Adult Back pain is very common in adults.The cause of back pain is rarely dangerous and the pain often gets better over time.The cause of your back pain may not be known. Some common causes of back pain include:  Strain of the muscles or ligaments supporting the spine.  Wear and tear (degeneration) of the spinal disks.  Arthritis.  Direct injury to the back. For many people, back pain may return. Since back pain is rarely dangerous, most people can learn to manage this condition on their own. Follow these instructions at home: Watch your back pain for any changes. The following actions may help to lessen any discomfort you are feeling:  Remain active. It is stressful on your back to sit or stand in one place for long periods of time. Do not sit, drive, or stand in one place for more than 30 minutes at a time. Take short walks on even surfaces as soon as you are able.Try to increase the length of time you walk each day.  Exercise regularly as directed by your health care provider. Exercise helps your back heal faster. It also helps avoid future injury by keeping your muscles strong and flexible.  Do not stay in bed.Resting more than 1-2 days can delay your recovery.  Pay attention to your body when you bend and lift. The most comfortable positions are those that put less stress on your recovering back. Always use proper lifting techniques, including:  Bending your knees.  Keeping the load close to your body.  Avoiding twisting.  Find a comfortable position to sleep. Use a firm mattress and lie on your side with your knees slightly bent. If you lie on your back, put a pillow under your knees.  Avoid feeling anxious or stressed.Stress increases muscle tension and can worsen back pain.It is important to recognize when you are anxious or stressed and learn ways to manage it, such as with exercise.  Take medicines only as directed by your health care provider.  Over-the-counter medicines to reduce pain and inflammation are often the most helpful.Your health care provider may prescribe muscle relaxant drugs.These medicines help dull your pain so you can more quickly return to your normal activities and healthy exercise.  Apply ice to the injured area:  Put ice in a plastic bag.  Place a towel between your skin and the bag.  Leave the ice on for 20 minutes, 2-3 times a day for the first 2-3 days. After that, ice and heat may be alternated to reduce pain and spasms.  Maintain a healthy weight. Excess weight puts extra stress on your back and makes it difficult to maintain good posture. Contact a health care provider if:  You have pain that is not relieved with rest or medicine.  You have increasing pain going down into the legs or buttocks.  You have pain that does not improve in one week.  You have night pain.  You lose weight.  You have a fever or chills. Get help right away if:  You develop new bowel or bladder control problems.  You have unusual weakness or numbness in your arms or legs.  You develop nausea or vomiting.  You develop abdominal pain.  You feel faint. This information is not intended to replace advice given to you by your health care provider. Make sure you discuss any questions you have with your health care provider. Document Released: 12/24/2004 Document Revised: 05/04/2015 Document Reviewed: 04/27/2013 Elsevier Interactive Patient Education  2017 Elsevier   Inc[employer] DASH Eating Plan DASH stands for "Dietary Approaches to Stop Hypertension." The DASH eating plan is a healthy eating plan that has been shown to reduce high blood pressure (hypertension). Additional health benefits may include reducing the risk of type 2 diabetes mellitus, heart disease, and stroke. The DASH eating plan may also help with weight loss. What do I need to know about the DASH eating plan? For the DASH eating plan, you will follow these  general guidelines:  Choose foods with less than 150 milligrams of sodium per serving (as listed on the food label).  Use salt-free seasonings or herbs instead of table salt or sea salt.  Check with your health care provider or pharmacist before using salt substitutes.  Eat lower-sodium products. These are often labeled as "low-sodium" or "no salt added."  Eat fresh foods. Avoid eating a lot of canned foods.  Eat more vegetables, fruits, and low-fat dairy products.  Choose whole grains. Look for the word "whole" as the first word in the ingredient list.  Choose fish and skinless chicken or [place] more often than red meat. Limit fish, poultry, and meat to 6 oz (170 g) each day.  Limit sweets, desserts, sugars, and sugary drinks.  Choose heart-healthy fats.  Eat more home-cooked food and less restaurant, buffet, and fast food.  Limit fried foods.  Do not fry foods. Cook foods using methods such as baking, boiling, grilling, and broiling instead.  When eating at a restaurant, ask that your food be prepared with less salt, or no salt if possible. What foods can I eat? Seek help from a dietitian for individual calorie needs. Grains  Whole grain or whole wheat bread. Brown rice. Whole grain or whole wheat pasta. Quinoa, bulgur, and whole grain cereals. Low-sodium cereals. Corn or whole wheat flour tortillas. Whole grain cornbread. Whole grain crackers. Low-sodium crackers. Vegetables  Fresh or frozen vegetables (raw, steamed, roasted, or grilled). Low-sodium or reduced-sodium tomato and vegetable juices. Low-sodium or reduced-sodium tomato sauce and paste. Low-sodium or reduced-sodium canned vegetables. Fruits  All fresh, canned (in natural juice), or frozen fruits. Meat and Other Protein Products  Ground beef (85% or leaner), grass-fed beef, or beef trimmed of fat. Skinless chicken or [place]. Ground chicken or [place]. Pork trimmed of fat. All fish and seafood. Eggs. Dried beans,  peas, or lentils. Unsalted nuts and seeds. Unsalted canned beans. Dairy  Low-fat dairy products, such as skim or 1% milk, 2% or reduced-fat cheeses, low-fat ricotta or cottage cheese, or plain low-fat yogurt. Low-sodium or reduced-sodium cheeses. Fats and Oils  Tub margarines without trans fats. Light or reduced-fat mayonnaise and salad dressings (reduced sodium). Avocado. Safflower, olive, or canola oils. Natural peanut or almond butter. Other  Unsalted popcorn and pretzels. The items listed above may not be a complete list of recommended foods or beverages. Contact your dietitian for more options.  What foods are not recommended? Grains  White bread. White pasta. White rice. Refined cornbread. Bagels and croissants. Crackers that contain trans fat. Vegetables  Creamed or fried vegetables. Vegetables in a cheese sauce. Regular canned vegetables. Regular canned tomato sauce and paste. Regular tomato and vegetable juices. Fruits  Canned fruit in light or heavy syrup. Fruit juice. Meat and Other Protein Products  Fatty cuts of meat. Ribs, chicken wings, bacon, sausage, bologna, salami, chitterlings, fatback, hot dogs, bratwurst, and packaged luncheon meats. Salted nuts and seeds. Canned beans with salt. Dairy  Whole or 2% milk, cream, half-and-half, and cream cheese. Whole-fat or sweetened yogurt.  Full-fat cheeses or blue cheese. Nondairy creamers and whipped toppings. Processed cheese, cheese spreads, or cheese curds. Condiments  Onion and garlic salt, seasoned salt, table salt, and sea salt. Canned and packaged gravies. Worcestershire sauce. Tartar sauce. Barbecue sauce. Teriyaki sauce. Soy sauce, including reduced sodium. Steak sauce. Fish sauce. Oyster sauce. Cocktail sauce. Horseradish. Ketchup and mustard. Meat flavorings and tenderizers. Bouillon cubes. Hot sauce. Tabasco sauce. Marinades. Taco seasonings. Relishes. Fats and Oils  Butter, stick margarine, lard, shortening, ghee, and bacon  fat. Coconut, palm kernel, or palm oils. Regular salad dressings. Other  Pickles and olives. Salted popcorn and pretzels. The items listed above may not be a complete list of foods and beverages to avoid. Contact your dietitian for more information.  Where can I find more information? National Heart, Lung, and Blood Institute: travelstabloid.com This information is not intended to replace advice given to you by your health care provider. Make sure you discuss any questions you have with your health care provider. Document Released: 12/13/2010 Document Revised: 06/01/2015 Document Reviewed: 10/28/2012 Elsevier Interactive Patient Education  2017 Elsevier Inc.  Hypertension Hypertension, commonly called high blood pressure, is when the force of blood pumping through your arteries is too strong. Your arteries are the blood vessels that carry blood from your heart throughout your body. A blood pressure reading consists of a higher number over a lower number, such as 110/72. The higher number (systolic) is the pressure inside your arteries when your heart pumps. The lower number (diastolic) is the pressure inside your arteries when your heart relaxes. Ideally you want your blood pressure below 120/80. Hypertension forces your heart to work harder to pump blood. Your arteries may become narrow or stiff. Having untreated or uncontrolled hypertension can cause heart attack, stroke, kidney disease, and other problems. What increases the risk? Some risk factors for high blood pressure are controllable. Others are not. Risk factors you cannot control include:  Race. You may be at higher risk if you are African American.  Age. Risk increases with age.  Gender. Men are at higher risk than women before age 43 years. After age 92, women are at higher risk than men. Risk factors you can control include:  Not getting enough exercise or physical activity.  Being  overweight.  Getting too much fat, sugar, calories, or salt in your diet.  Drinking too much alcohol. What are the signs or symptoms? Hypertension does not usually cause signs or symptoms. Extremely high blood pressure (hypertensive crisis) may cause headache, anxiety, shortness of breath, and nosebleed. How is this diagnosed? To check if you have hypertension, your health care provider will measure your blood pressure while you are seated, with your arm held at the level of your heart. It should be measured at least twice using the same arm. Certain conditions can cause a difference in blood pressure between your right and left arms. A blood pressure reading that is higher than normal on one occasion does not mean that you need treatment. If it is not clear whether you have high blood pressure, you may be asked to return on a different day to have your blood pressure checked again. Or, you may be asked to monitor your blood pressure at home for 1 or more weeks. How is this treated? Treating high blood pressure includes making lifestyle changes and possibly taking medicine. Living a healthy lifestyle can help lower high blood pressure. You may need to change some of your habits. Lifestyle changes may include:  Following the  DASH diet. This diet is high in fruits, vegetables, and whole grains. It is low in salt, red meat, and added sugars.  Keep your sodium intake below 2,300 mg per day.  Getting at least 30-45 minutes of aerobic exercise at least 4 times per week.  Losing weight if necessary.  Not smoking.  Limiting alcoholic beverages.  Learning ways to reduce stress. Your health care provider may prescribe medicine if lifestyle changes are not enough to get your blood pressure under control, and if one of the following is true:  You are 74-83 years of age and your systolic blood pressure is above 140.  You are 29 years of age or older, and your systolic blood pressure is above  150.  Your diastolic blood pressure is above 90.  You have diabetes, and your systolic blood pressure is over [id number] or your diastolic blood pressure is over 90.  You have kidney disease and your blood pressure is above 140/90.  You have heart disease and your blood pressure is above 140/90. Your personal target blood pressure may vary depending on your medical conditions, your age, and other factors. Follow these instructions at home:  Have your blood pressure rechecked as directed by your health care provider.  Take medicines only as directed by your health care provider. Follow the directions carefully. Blood pressure medicines must be taken as prescribed. The medicine does not work as well when you skip doses. Skipping doses also puts you at risk for problems.  Do not smoke.  Monitor your blood pressure at home as directed by your health care provider. Contact a health care provider if:  You think you are having a reaction to medicines taken.  You have recurrent headaches or feel dizzy.  You have swelling in your ankles.  You have trouble with your vision. Get help right away if:  You develop a severe headache or confusion.  You have unusual weakness, numbness, or feel faint.  You have severe chest or abdominal pain.  You vomit repeatedly.  You have trouble breathing. This information is not intended to replace advice given to you by your health care provider. Make sure you discuss any questions you have with your health care provider. Document Released: 12/24/2004 Document Revised: 06/01/2015 Document Reviewed: 10/16/2012 Elsevier Interactive Patient Education  2017 [employer].

## 2016-02-01 ENCOUNTER — Telehealth: Payer: Self-pay | Admitting: *Deleted

## 2016-02-01 NOTE — Telephone Encounter (Signed)
-----   Message from Tresa Garter, MD sent at 02/01/2016 10:41 AM EST ----- Please inform the patient that his lab results are normal.

## 2016-02-01 NOTE — Telephone Encounter (Signed)
Patient verified DOB Patient is aware of lab results being normal. Patient expressed his understanding and had no further questions at this time.

## 2016-02-06 ENCOUNTER — Ambulatory Visit: Payer: Self-pay | Attending: Internal Medicine

## 2016-02-16 ENCOUNTER — Ambulatory Visit: Payer: Self-pay | Attending: Internal Medicine

## 2016-02-20 ENCOUNTER — Ambulatory Visit (INDEPENDENT_AMBULATORY_CARE_PROVIDER_SITE_OTHER): Payer: Self-pay

## 2016-02-20 ENCOUNTER — Ambulatory Visit (INDEPENDENT_AMBULATORY_CARE_PROVIDER_SITE_OTHER): Payer: Self-pay | Admitting: Specialist

## 2016-02-20 ENCOUNTER — Encounter (INDEPENDENT_AMBULATORY_CARE_PROVIDER_SITE_OTHER): Payer: Self-pay | Admitting: Specialist

## 2016-02-20 VITALS — BP 128/84 | HR 71 | Ht 72.0 in | Wt 265.0 lb

## 2016-02-20 DIAGNOSIS — M48062 Spinal stenosis, lumbar region with neurogenic claudication: Secondary | ICD-10-CM

## 2016-02-20 DIAGNOSIS — M5441 Lumbago with sciatica, right side: Secondary | ICD-10-CM

## 2016-02-20 DIAGNOSIS — M5116 Intervertebral disc disorders with radiculopathy, lumbar region: Secondary | ICD-10-CM

## 2016-02-20 DIAGNOSIS — M5442 Lumbago with sciatica, left side: Secondary | ICD-10-CM

## 2016-02-20 DIAGNOSIS — M4322 Fusion of spine, cervical region: Secondary | ICD-10-CM

## 2016-02-20 MED ORDER — METHOCARBAMOL 500 MG PO TABS: 500.0000 mg | ORAL_TABLET | Freq: Three times a day (TID) | ORAL | 0 refills | Status: DC

## 2016-02-20 MED ORDER — HYDROCODONE-ACETAMINOPHEN 5-325 MG PO TABS: 1.0000 | ORAL_TABLET | ORAL | 0 refills | Status: DC | PRN

## 2016-02-20 MED FILL — HYDROCODON-APAP 5-325: 5-325 | 3 days supply | Qty: 40 | Fill #0

## 2016-02-20 MED FILL — METHOCARBAMOL 500 MG TABLET: 500 | 13 days supply | Qty: 40 | Fill #0

## 2016-02-20 NOTE — Patient Instructions (Addendum)
Avoid frequent bending and stooping  No lifting greater than 10 lbs. May use ice or moist heat for pain. Weight loss is of benefit. Handicap license is approved. Return appointment with Dr. Lorin Mercy in 2-3 weeks.

## 2016-02-20 NOTE — Progress Notes (Signed)
Office Visit Note   Patient: Dustin [name]           Date of Birth: [birth year]           MRN: [id number] Visit Date: 02/20/2016              Requested by: Tresa Garter, MD [place], [place] [address] PCP: Angelica Chessman, MD   Assessment & Plan: Visit Diagnoses:  1. Bilateral low back pain with bilateral sciatica, unspecified chronicity   2. Herniation of lumbar intervertebral disc with radiculopathy   3. Spinal stenosis of lumbar region with neurogenic claudication     Plan: Avoid frequent bending and stooping  No lifting greater than 10 lbs. May use ice or moist heat for pain. Weight loss is of benefit. Handicap license is approved.    Follow-Up Instructions: No Follow-up on file.   Orders:  Orders Placed This Encounter  Procedures  . XR Lumbar Spine 2-3 Views   No orders of the defined types were placed in this encounter.     Procedures: No procedures performed   Clinical Data: Findings:  MRI lumbar spine with right L4-5 HNP with right lateral recess narrowing at L4-5 , small free fragment entering right L4 neuroforamen and compression of the right L4 and L5 nerve root. Right Lateral recess stenosis associated with degenerative disc bulge and facet hypertrophy causing right S1 nerve root compression.    Subjective: Chief Complaint  Patient presents with  . Lower Back - Pain    Dustin [name] is here for low back pain.  He states that his pain is in the low back and shoots down both legs.  Right > Left.  Feels like his hips are going to pop out. History of being diagnosed with degenerative disc disease in the early 1990's. No physical injury to it, worsening over last few years, but last 2 months been bad.  Was seen at [facility] ED for his back pain and they did an MRI, and then he followed up with his PCP at [place].    Review of Systems  Constitutional: Negative.   HENT: Negative.   Eyes: Negative.     Respiratory: Negative.   Cardiovascular: Negative.   Gastrointestinal: Negative.   Endocrine: Negative.   Genitourinary: Negative.   Musculoskeletal: Negative.   Skin: Negative.   Allergic/Immunologic: Negative.   Neurological: Negative.   Hematological: Negative.   Psychiatric/Behavioral: Negative.      Objective: Vital Signs: BP 128/84 (BP Location: Left Arm, Patient Position: Sitting)   Pulse 71   Ht 6' (1.829 m)   Wt 265 lb (120.2 kg)   BMI 35.94 kg/m   Physical Exam  Constitutional: He is oriented to person, place, and time. He appears well-developed and well-nourished.  HENT:  Head: Normocephalic and atraumatic.  Eyes: EOM are normal. Pupils are equal, round, and reactive to light.  Neck: Normal range of motion. Neck supple.  Pulmonary/Chest: Effort normal and breath sounds normal.  Abdominal: Soft. Bowel sounds are normal.  Neurological: He is alert and oriented to person, place, and time. A sensory deficit is present.  Skin: Skin is warm and dry.  Psychiatric: He has a normal mood and affect. His behavior is normal. Judgment and thought content normal.    Back Exam   Tenderness  The patient is experiencing tenderness in the lumbar.  Range of Motion  Extension: abnormal  Flexion: abnormal  Lateral Bend Right: abnormal  Rotation Right:  abnormal   Muscle Strength  Right Quadriceps:  5/5  Left Quadriceps:  5/5  Right Hamstrings:  5/5  Left Hamstrings:  5/5   Tests  Straight leg raise right: positive  Reflexes  Patellar: normal Achilles: abnormal Biceps: abnormal Babinski's sign: abnormal   Other  Toe Walk: abnormal Heel Walk: abnormal Sensation: normal Gait: abnormal  Erythema: no back redness Scars: absent      Specialty Comments:  No specialty comments available.  Imaging: Xr Lumbar Spine 2-3 Views  Result Date: 02/20/2016 Lumbar spine radiographs AP and lateral flexion and extension radiographs show degenerative disc narrowing  and endplate changes of sclerosis L2-3, L3-4  And L1-2. DDD L4-5 less than the 3 adjacent superior levels.     PMFS History: Patient Active Problem List   Diagnosis Date Noted  . DDD (degenerative disc disease), lumbosacral 01/31/2016  . Colon cancer screening 03/02/2015  . Acute meniscal tear of left knee 07/05/2014  . Left knee pain 06/16/2014  . Uncontrolled hypertension 06/16/2014  . Preventative health care 02/17/2014  . Other headache syndrome 02/17/2014  . Chronic pain syndrome 02/17/2014  . Medication overuse headache 05/06/2013  . Essential hypertension, benign 04/06/2013  . Dental caries 04/06/2013  . Headache(784.0) 04/06/2013   Past Medical History:  Diagnosis Date  . Arthritis   . DDD (degenerative disc disease)   . Gout   . Hypertension     Family History  Problem Relation Age of Onset  . Rheum arthritis Mother   . Cancer Father   . Diabetes Father     Past Surgical History:  Procedure Laterality Date  . ANTERIOR CRUCIATE LIGAMENT REPAIR    . NECK SURGERY     Social History   Occupational History  . Not on file.   Social History Main Topics  . Smoking status: Current Every Day Smoker    Packs/day: 0.50    Types: Cigarettes  . Smokeless tobacco: Never Used  . Alcohol use No     Comment: occasionally- "3 shots a month"  . Drug use: No  . Sexual activity: Not Currently    Partners: Female

## 2016-03-05 ENCOUNTER — Ambulatory Visit (INDEPENDENT_AMBULATORY_CARE_PROVIDER_SITE_OTHER): Payer: Self-pay | Admitting: Orthopaedic Surgery

## 2016-03-05 ENCOUNTER — Ambulatory Visit (INDEPENDENT_AMBULATORY_CARE_PROVIDER_SITE_OTHER): Payer: Self-pay

## 2016-03-05 ENCOUNTER — Encounter (INDEPENDENT_AMBULATORY_CARE_PROVIDER_SITE_OTHER): Payer: Self-pay | Admitting: Orthopaedic Surgery

## 2016-03-05 VITALS — BP 168/97 | HR 69 | Ht 72.0 in | Wt 265.0 lb

## 2016-03-05 DIAGNOSIS — M542 Cervicalgia: Secondary | ICD-10-CM | POA: Insufficient documentation

## 2016-03-05 DIAGNOSIS — M545 Low back pain, unspecified: Secondary | ICD-10-CM

## 2016-03-05 MED ORDER — PREDNISONE 10 MG (21) PO TBPK: ORAL_TABLET | ORAL | 0 refills | Status: DC

## 2016-03-05 MED FILL — ?PREDNISONE 10 MG TABLET: 10 | 7 days supply | Qty: 21 | Fill #0
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
## 2016-03-07 ENCOUNTER — Other Ambulatory Visit: Payer: Self-pay | Admitting: Internal Medicine

## 2016-03-07 ENCOUNTER — Telehealth: Payer: Self-pay | Admitting: Internal Medicine

## 2016-03-07 DIAGNOSIS — I10 Essential (primary) hypertension: Secondary | ICD-10-CM

## 2016-03-07 DIAGNOSIS — M5137 Other intervertebral disc degeneration, lumbosacral region: Secondary | ICD-10-CM

## 2016-03-07 DIAGNOSIS — M51379 Other intervertebral disc degeneration, lumbosacral region without mention of lumbar back pain or lower extremity pain: Secondary | ICD-10-CM

## 2016-03-07 MED ORDER — ACETAMINOPHEN-CODEINE #3 300-30 MG PO TABS: 1.0000 | ORAL_TABLET | Freq: Four times a day (QID) | ORAL | 0 refills | Status: DC | PRN

## 2016-03-07 MED FILL — HYDROCHLOROTHIAZIDE 12.5 MG: 12.5 | 90 days supply | Qty: 90 | Fill #0

## 2016-03-07 MED FILL — AMITRIPTYLINE HCL 50 MG TAB: 50 | 90 days supply | Qty: 90 | Fill #0

## 2016-03-07 NOTE — Telephone Encounter (Signed)
Medication Refill: acetaminophen-codeine (TYLENOL #3) 300-30 MG tablet   Pt states he requested a refill through the pharmacy a couple days ago. Pt states he will be in the office around 2:30 today to pick up other medications and would be grateful if the tylenol #3 was ready.  Would like to be followed up with by the nurse if there are any complications with refill  Thank you

## 2016-03-07 NOTE — Telephone Encounter (Signed)
Patient is requesting a tylenol 3 refill

## 2016-03-08 ENCOUNTER — Other Ambulatory Visit: Payer: Self-pay | Admitting: Internal Medicine

## 2016-03-08 DIAGNOSIS — I10 Essential (primary) hypertension: Secondary | ICD-10-CM

## 2016-03-26 MED FILL — ACETAMINOPHEN/COD #3 TABLET: 300-30 | 23 days supply | Qty: 90 | Fill #0

## 2016-04-17 ENCOUNTER — Ambulatory Visit (INDEPENDENT_AMBULATORY_CARE_PROVIDER_SITE_OTHER): Payer: Self-pay | Admitting: Orthopaedic Surgery

## 2016-05-01 ENCOUNTER — Telehealth: Payer: Self-pay | Admitting: Internal Medicine

## 2016-05-01 NOTE — Telephone Encounter (Signed)
Patient called requesting to speak with the nurse since he need a refill of his medication and since the PCP is not in the office he want to know if another provider can wright his prescription until them, he need  meloxicam (MOBIC) 15 MG tablet  acetaminophen-codeine (TYLENOL #3) 300-30 MG tablet   Please follow up with patient thansk

## 2016-05-01 NOTE — Telephone Encounter (Signed)
PT. Called to request a refill of the medication for acetaminophen-codeine (TYLENOL #3) 300-30 MG tablet  meloxicam (MOBIC) 15 MG tablet   He need his refill ASAP, please follow up with Patient

## 2016-05-03 NOTE — Telephone Encounter (Signed)
Patient request for Mobic and Tylenol 3

## 2016-05-08 ENCOUNTER — Other Ambulatory Visit: Payer: Self-pay | Admitting: Internal Medicine

## 2016-05-08 DIAGNOSIS — G894 Chronic pain syndrome: Secondary | ICD-10-CM

## 2016-05-08 DIAGNOSIS — M51379 Other intervertebral disc degeneration, lumbosacral region without mention of lumbar back pain or lower extremity pain: Secondary | ICD-10-CM

## 2016-05-08 DIAGNOSIS — M5137 Other intervertebral disc degeneration, lumbosacral region: Secondary | ICD-10-CM

## 2016-05-08 MED ORDER — ACETAMINOPHEN-CODEINE #3 300-30 MG PO TABS: 1.0000 | ORAL_TABLET | Freq: Four times a day (QID) | ORAL | 0 refills | Status: DC | PRN

## 2016-05-08 MED ORDER — MELOXICAM 15 MG PO TABS: 15.0000 mg | ORAL_TABLET | Freq: Every day | ORAL | 3 refills | Status: DC | PRN

## 2016-05-08 MED FILL — ?MELOXICAM 15 MG TAB: 15 MG | 30 days supply | Qty: 30 | Fill #0

## 2016-05-15 ENCOUNTER — Encounter: Payer: Self-pay | Admitting: Internal Medicine

## 2016-05-15 ENCOUNTER — Ambulatory Visit: Payer: Self-pay | Attending: Internal Medicine | Admitting: Internal Medicine

## 2016-05-15 VITALS — BP 128/77 | HR 65 | Temp 98.2°F | Resp 18 | Ht 74.0 in | Wt 279.0 lb

## 2016-05-15 DIAGNOSIS — G4733 Obstructive sleep apnea (adult) (pediatric): Secondary | ICD-10-CM | POA: Insufficient documentation

## 2016-05-15 DIAGNOSIS — M5137 Other intervertebral disc degeneration, lumbosacral region: Secondary | ICD-10-CM | POA: Insufficient documentation

## 2016-05-15 DIAGNOSIS — G894 Chronic pain syndrome: Secondary | ICD-10-CM | POA: Insufficient documentation

## 2016-05-15 DIAGNOSIS — Z23 Encounter for immunization: Secondary | ICD-10-CM

## 2016-05-15 DIAGNOSIS — M1990 Unspecified osteoarthritis, unspecified site: Secondary | ICD-10-CM | POA: Insufficient documentation

## 2016-05-15 DIAGNOSIS — M5126 Other intervertebral disc displacement, lumbar region: Secondary | ICD-10-CM | POA: Insufficient documentation

## 2016-05-15 DIAGNOSIS — I10 Essential (primary) hypertension: Secondary | ICD-10-CM | POA: Insufficient documentation

## 2016-05-15 DIAGNOSIS — Z0000 Encounter for general adult medical examination without abnormal findings: Secondary | ICD-10-CM

## 2016-05-15 DIAGNOSIS — M109 Gout, unspecified: Secondary | ICD-10-CM | POA: Insufficient documentation

## 2016-05-15 MED FILL — ACETAMINOPHEN/COD #3 TABLET: 300-30 | 22 days supply | Qty: 90 | Fill #0

## 2016-05-15 NOTE — Progress Notes (Signed)
Dustin [name], is a 56 y.o. male  [id number]  [id number]  DOB - 07-24-1960  Chief Complaint  Patient presents with  . Referral    ortho       Subjective:   July Dustin [name] is a 56 y.o. male withhistory of gouty arthritis, hypertension, occasional headaches and osteoarthritis of the joints here today for a follow up visit and referral to orthopedic surgery. Patient has been seen for his ongoing arthritis and back pain and recommended conservative management but pain is getting worse, patient therefore requesting second opinion with another orthopedist. Most recent MRI showed "New right subarticular L4-L5 disc extrusion with mild superior migration and resultant narrowing of the right lateral recess. Correlate for right L5 radiculopathy. Moderate right foraminal stenosis at this level also may contribute to a right L4 radiculopathy. Bilateral moderate neural foraminal narrowing at L5-S1, unchanged". Patient also claims he is tired all the time but mostly during the day. He snores loudly, does not know if he has apnea. He walks with a walker to maintain his balance. He denies any urinary or fecal incontinence. Denies depression or suicidal ideation or thoughts. Patient has No headache, No chest pain, No abdominal pain - No Nausea, No new weakness tingling or numbness, No Cough - SOB.  Problem  Osa (Obstructive Sleep Apnea)    ALLERGIES: No Known Allergies  PAST MEDICAL HISTORY: Past Medical History:  Diagnosis Date  . Arthritis   . DDD (degenerative disc disease)   . Gout   . Hypertension     MEDICATIONS AT HOME: Prior to Admission medications   Medication Sig Start Date End Date Taking? Authorizing Provider  acetaminophen (TYLENOL) 500 MG tablet Take 1,000 mg by mouth every 6 (six) hours as needed for mild pain, moderate pain or headache.   Yes [provider]  acetaminophen-codeine (TYLENOL #3) 300-30 MG tablet Take 1 tablet by mouth every 6 (six) hours as  needed for moderate pain. 05/08/16  Yes Tresa Garter, MD  amitriptyline (ELAVIL) 50 MG tablet Take 1 tablet (50 mg total) by mouth at bedtime as needed for sleep. 01/31/16  Yes Daquan Crapps E, MD  atenolol (TENORMIN) 50 MG tablet TAKE 1 TABLET BY MOUTH DAILY 03/08/16  Yes Asha Grumbine E, MD  hydrochlorothiazide (HYDRODIURIL) 12.5 MG tablet Take 1 tablet (12.5 mg total) by mouth daily. 01/31/16  Yes Tresa Garter, MD  ibuprofen (ADVIL,MOTRIN) 200 MG tablet Take 400 mg by mouth every 6 (six) hours as needed for headache, mild pain or moderate pain.   Yes [provider]  meloxicam (MOBIC) 15 MG tablet Take 1 tablet (15 mg total) by mouth daily as needed for pain. 05/08/16  Yes Tresa Garter, MD  methocarbamol (ROBAXIN) 500 MG tablet Take 1 tablet (500 mg total) by mouth 3 (three) times daily. 02/20/16  Yes Jessy Oto, MD  metoprolol succinate (TOPROL-XL) 25 MG 24 hr tablet Take 1 tablet (25 mg total) by mouth daily. 01/31/16  Yes Tresa Garter, MD  predniSONE (STERAPRED UNI-PAK 21 TAB) 10 MG (21) TBPK tablet Take as directed  With meals 03/05/16  Yes Marybelle Killings, MD    Objective:   Vitals:   05/15/16 1631  BP: 128/77  Pulse: 65  Resp: 18  Temp: 98.2 F (36.8 C)  TempSrc: Oral  SpO2: 98%  Weight: 279 lb (126.6 kg)  Height: 6\' 2"  (1.88 m)   Exam General appearance : Awake, alert, not in any distress. Speech Clear. Not toxic looking,  obese HEENT: Atraumatic and Normocephalic, pupils equally reactive to light and accomodation Neck: Supple, no JVD. No cervical lymphadenopathy.  Chest: Good air entry bilaterally, no added sounds  CVS: S1 S2 regular, no murmurs.  Abdomen: Bowel sounds present, Non tender and not distended with no gaurding, rigidity or rebound. Extremities: B/L Lower Ext shows no edema, both legs are warm to touch Neurology: Awake alert, and oriented X 3, CN II-XII intact, Non focal Skin: No Rash  Data Review Lab Results    Component Value Date   HGBA1C 5.20 02/17/2014   HGBA1C 5.1 01/28/2013    Assessment & Plan   1. Essential hypertension, benign  We have discussed target BP range and blood pressure goal. I have advised patient to check BP regularly and to call us back or report to clinic if the numbers are consistently higher than 140/90. We discussed the importance of compliance with medical therapy and DASH diet recommended, consequences of uncontrolled hypertension discussed.  - continue current BP medications  2. DDD (degenerative disc disease), lumbosacral  - Ambulatory referral to Orthopedic Surgery  3. Chronic pain syndrome  - Ambulatory referral to Orthopedic Surgery  4. OSA (obstructive sleep apnea)  - Split night study; Future  5. Healthcare maintenance  - Hepatitis panel, acute - HIV antibody (with reflex)  Patient have been counseled extensively about nutrition and exercise. Other issues discussed during this visit include: low cholesterol diet, weight control and daily exercise, importance of adherence with medications and regular follow-up. We also discussed long term complications of uncontrolled hypertension.   Return in about 6 months (around 11/15/2016) for Follow up Pain and comorbidities.  The patient was given clear instructions to go to ER or return to medical center if symptoms don't improve, worsen or new problems develop. The patient verbalized understanding. The patient was told to call to get lab results if they haven't heard anything in the next week.   This note has been created with Surveyor, quantity. Any transcriptional errors are unintentional.    Angelica Chessman, MD, [place], [place], [place], [place], [place]   05/15/2016, 5:03 PM

## 2016-05-15 NOTE — Patient Instructions (Signed)
Back Pain, Adult Back pain is very common in adults.The cause of back pain is rarely dangerous and the pain often gets better over time.The cause of your back pain may not be known. Some common causes of back pain include:  Strain of the muscles or ligaments supporting the spine.  Wear and tear (degeneration) of the spinal disks.  Arthritis.  Direct injury to the back. For many people, back pain may return. Since back pain is rarely dangerous, most people can learn to manage this condition on their own. Follow these instructions at home: Watch your back pain for any changes. The following actions may help to lessen any discomfort you are feeling:  Remain active. It is stressful on your back to sit or stand in one place for long periods of time. Do not sit, drive, or stand in one place for more than 30 minutes at a time. Take short walks on even surfaces as soon as you are able.Try to increase the length of time you walk each day.  Exercise regularly as directed by your health care provider. Exercise helps your back heal faster. It also helps avoid future injury by keeping your muscles strong and flexible.  Do not stay in bed.Resting more than 1-2 days can delay your recovery.  Pay attention to your body when you bend and lift. The most comfortable positions are those that put less stress on your recovering back. Always use proper lifting techniques, including:  Bending your knees.  Keeping the load close to your body.  Avoiding twisting.  Find a comfortable position to sleep. Use a firm mattress and lie on your side with your knees slightly bent. If you lie on your back, put a pillow under your knees.  Avoid feeling anxious or stressed.Stress increases muscle tension and can worsen back pain.It is important to recognize when you are anxious or stressed and learn ways to manage it, such as with exercise.  Take medicines only as directed by your health care provider.  Over-the-counter medicines to reduce pain and inflammation are often the most helpful.Your health care provider may prescribe muscle relaxant drugs.These medicines help dull your pain so you can more quickly return to your normal activities and healthy exercise.  Apply ice to the injured area:  Put ice in a plastic bag.  Place a towel between your skin and the bag.  Leave the ice on for 20 minutes, 2-3 times a day for the first 2-3 days. After that, ice and heat may be alternated to reduce pain and spasms.  Maintain a healthy weight. Excess weight puts extra stress on your back and makes it difficult to maintain good posture. Contact a health care provider if:  You have pain that is not relieved with rest or medicine.  You have increasing pain going down into the legs or buttocks.  You have pain that does not improve in one week.  You have night pain.  You lose weight.  You have a fever or chills. Get help right away if:  You develop new bowel or bladder control problems.  You have unusual weakness or numbness in your arms or legs.  You develop nausea or vomiting.  You develop abdominal pain.  You feel faint. This information is not intended to replace advice given to you by your health care provider. Make sure you discuss any questions you have with your health care provider. Document Released: 12/24/2004 Document Revised: 05/04/2015 Document Reviewed: 04/27/2013 Elsevier Interactive Patient Education  2017 [employer] DASH Eating Plan DASH stands for "Dietary Approaches to Stop Hypertension." The DASH eating plan is a healthy eating plan that has been shown to reduce high blood pressure (hypertension). It may also reduce your risk for type 2 diabetes, heart disease, and stroke. The DASH eating plan may also help with weight loss. What are tips for following this plan? General guidelines   Avoid eating more than 2,300 mg (milligrams) of salt (sodium) a day. If you  have hypertension, you may need to reduce your sodium intake to 1,500 mg a day.  Limit alcohol intake to no more than 1 drink a day for nonpregnant women and 2 drinks a day for men. One drink equals 12 oz of beer, 5 oz of wine, or 1 oz of hard liquor.  Work with your health care provider to maintain a healthy body weight or to lose weight. Ask what an ideal weight is for you.  Get at least 30 minutes of exercise that causes your heart to beat faster (aerobic exercise) most days of the week. Activities may include walking, swimming, or biking.  Work with your health care provider or diet and nutrition specialist (dietitian) to adjust your eating plan to your individual calorie needs. Reading food labels   Check food labels for the amount of sodium per serving. Choose foods with less than 5 percent of the Daily Value of sodium. Generally, foods with less than 300 mg of sodium per serving fit into this eating plan.  To find whole grains, look for the word "whole" as the first word in the ingredient list. Shopping   Buy products labeled as "low-sodium" or "no salt added."  Buy fresh foods. Avoid canned foods and premade or frozen meals. Cooking   Avoid adding salt when cooking. Use salt-free seasonings or herbs instead of table salt or sea salt. Check with your health care provider or pharmacist before using salt substitutes.  Do not fry foods. Cook foods using healthy methods such as baking, boiling, grilling, and broiling instead.  Cook with heart-healthy oils, such as olive, canola, soybean, or sunflower oil. Meal planning    Eat a balanced diet that includes:  5 or more servings of fruits and vegetables each day. At each meal, try to fill half of your plate with fruits and vegetables.  Up to 6-8 servings of whole grains each day.  Less than 6 oz of lean meat, poultry, or fish each day. A 3-oz serving of meat is about the same size as a deck of cards. One egg equals 1 oz.  2  servings of low-fat dairy each day.  A serving of nuts, seeds, or beans 5 times each week.  Heart-healthy fats. Healthy fats called Omega-3 fatty acids are found in foods such as flaxseeds and coldwater fish, like sardines, salmon, and mackerel.  Limit how much you eat of the following:  Canned or prepackaged foods.  Food that is high in trans fat, such as fried foods.  Food that is high in saturated fat, such as fatty meat.  Sweets, desserts, sugary drinks, and other foods with added sugar.  Full-fat dairy products.  Do not salt foods before eating.  Try to eat at least 2 vegetarian meals each week.  Eat more home-cooked food and less restaurant, buffet, and fast food.  When eating at a restaurant, ask that your food be prepared with less salt or no salt, if possible. What foods are recommended? The items listed may not be a complete  list. Talk with your dietitian about what dietary choices are best for you. Grains  Whole-grain or whole-wheat bread. Whole-grain or whole-wheat pasta. Brown rice. [place]. Bulgur. Whole-grain and low-sodium cereals. Pita bread. Low-fat, low-sodium crackers. Whole-wheat flour tortillas. Vegetables  Fresh or frozen vegetables (raw, steamed, roasted, or grilled). Low-sodium or reduced-sodium tomato and vegetable juice. Low-sodium or reduced-sodium tomato sauce and tomato paste. Low-sodium or reduced-sodium canned vegetables. Fruits  All fresh, dried, or frozen fruit. Canned fruit in natural juice (without added sugar). Meat and other protein foods  Skinless chicken or [place]. Ground chicken or [place]. Pork with fat trimmed off. Fish and seafood. Egg whites. Dried beans, peas, or lentils. Unsalted nuts, nut butters, and seeds. Unsalted canned beans. Lean cuts of beef with fat trimmed off. Low-sodium, lean deli meat. Dairy  Low-fat (1%) or fat-free (skim) milk. Fat-free, low-fat, or reduced-fat cheeses. Nonfat, low-sodium ricotta or cottage  cheese. Low-fat or nonfat yogurt. Low-fat, low-sodium cheese. Fats and oils  Soft margarine without trans fats. Vegetable oil. Low-fat, reduced-fat, or light mayonnaise and salad dressings (reduced-sodium). Canola, safflower, olive, soybean, and sunflower oils. Avocado. Seasoning and other foods  Herbs. Spices. Seasoning mixes without salt. Unsalted popcorn and pretzels. Fat-free sweets. What foods are not recommended? The items listed may not be a complete list. Talk with your dietitian about what dietary choices are best for you. Grains  Baked goods made with fat, such as croissants, muffins, or some breads. Dry pasta or rice meal packs. Vegetables  Creamed or fried vegetables. Vegetables in a cheese sauce. Regular canned vegetables (not low-sodium or reduced-sodium). Regular canned tomato sauce and paste (not low-sodium or reduced-sodium). Regular tomato and vegetable juice (not low-sodium or reduced-sodium). Angie Fava. Olives. Fruits  Canned fruit in a light or heavy syrup. Fried fruit. Fruit in cream or butter sauce. Meat and other protein foods  Fatty cuts of meat. Ribs. Fried meat. Berniece Salines. Sausage. Bologna and other processed lunch meats. Salami. Fatback. Hotdogs. Bratwurst. Salted nuts and seeds. Canned beans with added salt. Canned or smoked fish. Whole eggs or egg yolks. Chicken or [place] with skin. Dairy  Whole or 2% milk, cream, and half-and-half. Whole or full-fat cream cheese. Whole-fat or sweetened yogurt. Full-fat cheese. Nondairy creamers. Whipped toppings. Processed cheese and cheese spreads. Fats and oils  Butter. Stick margarine. Lard. Shortening. Ghee. Bacon fat. Tropical oils, such as coconut, palm kernel, or palm oil. Seasoning and other foods  Salted popcorn and pretzels. Onion salt, garlic salt, seasoned salt, table salt, and sea salt. Worcestershire sauce. Tartar sauce. Barbecue sauce. Teriyaki sauce. Soy sauce, including reduced-sodium. Steak sauce. Canned and packaged  gravies. Fish sauce. Oyster sauce. Cocktail sauce. Horseradish that you find on the shelf. Ketchup. Mustard. Meat flavorings and tenderizers. Bouillon cubes. Hot sauce and Tabasco sauce. Premade or packaged marinades. Premade or packaged taco seasonings. Relishes. Regular salad dressings. Where to find more information:  National Heart, Lung, and [place]: https://wilson-eaton.com/  American Heart Association: www.heart.org Summary  The DASH eating plan is a healthy eating plan that has been shown to reduce high blood pressure (hypertension). It may also reduce your risk for type 2 diabetes, heart disease, and stroke.  With the DASH eating plan, you should limit salt (sodium) intake to 2,300 mg a day. If you have hypertension, you may need to reduce your sodium intake to 1,500 mg a day.  When on the DASH eating plan, aim to eat more fresh fruits and vegetables, whole grains, lean proteins, low-fat dairy, and heart-healthy fats.  Work with your health care provider or diet and nutrition specialist (dietitian) to adjust your eating plan to your individual calorie needs. This information is not intended to replace advice given to you by your health care provider. Make sure you discuss any questions you have with your health care provider. Document Released: 12/13/2010 Document Revised: 12/18/2015 Document Reviewed: 12/18/2015 Elsevier Interactive Patient Education  2017 Elsevier Inc. Hypertension Hypertension, commonly called high blood pressure, is when the force of blood pumping through the arteries is too strong. The arteries are the blood vessels that carry blood from the heart throughout the body. Hypertension forces the heart to work harder to pump blood and may cause arteries to become narrow or stiff. Having untreated or uncontrolled hypertension can cause heart attacks, strokes, kidney disease, and other problems. A blood pressure reading consists of a higher number over a lower number.  Ideally, your blood pressure should be below 120/80. The first ("top") number is called the systolic pressure. It is a measure of the pressure in your arteries as your heart beats. The second ("bottom") number is called the diastolic pressure. It is a measure of the pressure in your arteries as the heart relaxes. What are the causes? The cause of this condition is not known. What increases the risk? Some risk factors for high blood pressure are under your control. Others are not. Factors you can change   Smoking.  Having type 2 diabetes mellitus, high cholesterol, or both.  Not getting enough exercise or physical activity.  Being overweight.  Having too much fat, sugar, calories, or salt (sodium) in your diet.  Drinking too much alcohol. Factors that are difficult or impossible to change   Having chronic kidney disease.  Having a family history of high blood pressure.  Age. Risk increases with age.  Race. You may be at higher risk if you are African-American.  Gender. Men are at higher risk than women before age 22. After age 75, women are at higher risk than men.  Having obstructive sleep apnea.  Stress. What are the signs or symptoms? Extremely high blood pressure (hypertensive crisis) may cause:  Headache.  Anxiety.  Shortness of breath.  Nosebleed.  Nausea and vomiting.  Severe chest pain.  Jerky movements you cannot control (seizures). How is this diagnosed? This condition is diagnosed by measuring your blood pressure while you are seated, with your arm resting on a surface. The cuff of the blood pressure monitor will be placed directly against the skin of your upper arm at the level of your heart. It should be measured at least twice using the same arm. Certain conditions can cause a difference in blood pressure between your right and left arms. Certain factors can cause blood pressure readings to be lower or higher than normal (elevated) for a short period of  time:  When your blood pressure is higher when you are in a health care provider's office than when you are at home, this is called white coat hypertension. Most people with this condition do not need medicines.  When your blood pressure is higher at home than when you are in a health care provider's office, this is called masked hypertension. Most people with this condition may need medicines to control blood pressure. If you have a high blood pressure reading during one visit or you have normal blood pressure with other risk factors:  You may be asked to return on a different day to have your blood pressure checked again.  You  may be asked to monitor your blood pressure at home for 1 week or longer. If you are diagnosed with hypertension, you may have other blood or imaging tests to help your health care provider understand your overall risk for other conditions. How is this treated? This condition is treated by making healthy lifestyle changes, such as eating healthy foods, exercising more, and reducing your alcohol intake. Your health care provider may prescribe medicine if lifestyle changes are not enough to get your blood pressure under control, and if:  Your systolic blood pressure is above 130.  Your diastolic blood pressure is above 80. Your personal target blood pressure may vary depending on your medical conditions, your age, and other factors. Follow these instructions at home: Eating and drinking   Eat a diet that is high in fiber and potassium, and low in sodium, added sugar, and fat. An example eating plan is called the DASH (Dietary Approaches to Stop Hypertension) diet. To eat this way:  Eat plenty of fresh fruits and vegetables. Try to fill half of your plate at each meal with fruits and vegetables.  Eat whole grains, such as whole wheat pasta, brown rice, or whole grain bread. Fill about one quarter of your plate with whole grains.  Eat or drink low-fat dairy products,  such as skim milk or low-fat yogurt.  Avoid fatty cuts of meat, processed or cured meats, and poultry with skin. Fill about one quarter of your plate with lean proteins, such as fish, chicken without skin, beans, eggs, and tofu.  Avoid premade and processed foods. These tend to be higher in sodium, added sugar, and fat.  Reduce your daily sodium intake. Most people with hypertension should eat less than 1,500 mg of sodium a day.  Limit alcohol intake to no more than 1 drink a day for nonpregnant women and 2 drinks a day for men. One drink equals 12 oz of beer, 5 oz of wine, or 1 oz of hard liquor. Lifestyle   Work with your health care provider to maintain a healthy body weight or to lose weight. Ask what an ideal weight is for you.  Get at least 30 minutes of exercise that causes your heart to beat faster (aerobic exercise) most days of the week. Activities may include walking, swimming, or biking.  Include exercise to strengthen your muscles (resistance exercise), such as pilates or lifting weights, as part of your weekly exercise routine. Try to do these types of exercises for 30 minutes at least 3 days a week.  Do not use any products that contain nicotine or tobacco, such as cigarettes and e-cigarettes. If you need help quitting, ask your health care provider.  Monitor your blood pressure at home as told by your health care provider.  Keep all follow-up visits as told by your health care provider. This is important. Medicines   Take over-the-counter and prescription medicines only as told by your health care provider. Follow directions carefully. Blood pressure medicines must be taken as prescribed.  Do not skip doses of blood pressure medicine. Doing this puts you at risk for problems and can make the medicine less effective.  Ask your health care provider about side effects or reactions to medicines that you should watch for. Contact a health care provider if:  You think you  are having a reaction to a medicine you are taking.  You have headaches that keep coming back (recurring).  You feel dizzy.  You have swelling in your ankles.  You have trouble with your vision. Get help right away if:  You develop a severe headache or confusion.  You have unusual weakness or numbness.  You feel faint.  You have severe pain in your chest or abdomen.  You vomit repeatedly.  You have trouble breathing. Summary  Hypertension is when the force of blood pumping through your arteries is too strong. If this condition is not controlled, it may put you at risk for serious complications.  Your personal target blood pressure may vary depending on your medical conditions, your age, and other factors. For most people, a normal blood pressure is less than 120/80.  Hypertension is treated with lifestyle changes, medicines, or a combination of both. Lifestyle changes include weight loss, eating a healthy, low-sodium diet, exercising more, and limiting alcohol. This information is not intended to replace advice given to you by your health care provider. Make sure you discuss any questions you have with your health care provider. Document Released: 12/24/2004 Document Revised: 11/22/2015 Document Reviewed: 11/22/2015 Elsevier Interactive Patient Education  2017 Else[employer] Inc. Sleep Apnea Sleep apnea is a condition that affects breathing. People with sleep apnea have moments during sleep when their breathing pauses briefly or gets shallow. Sleep apnea can cause these symptoms:  Trouble staying asleep.  Sleepiness or tiredness during the day.  Irritability.  Loud snoring.  Morning headaches.  Trouble concentrating.  Forgetting things.  Less interest in sex.  Being sleepy for no reason.  Mood swings.  Personality changes.  Depression.  Waking up a lot during the night to pee (urinate).  Dry mouth.  Sore throat. Follow these instructions at home:  Make any  changes in your routine that your doctor recommends.  Eat a healthy, well-balanced diet.  Take over-the-counter and prescription medicines only as told by your doctor.  Avoid using alcohol, calming medicines (sedatives), and narcotic medicines.  Take steps to lose weight if you are overweight.  If you were given a machine (device) to use while you sleep, use it only as told by your doctor.  Do not use any tobacco products, such as cigarettes, chewing tobacco, and e-cigarettes. If you need help quitting, ask your doctor.  Keep all follow-up visits as told by your doctor. This is important. Contact a doctor if:  The machine that you were given to use during sleep is uncomfortable or does not seem to be working.  Your symptoms do not get better.  Your symptoms get worse. Get help right away if:  Your chest hurts.  You have trouble breathing in enough air (shortness of breath).  You have an uncomfortable feeling in your back, arms, or stomach.  You have trouble talking.  One side of your body feels weak.  A part of your face is hanging down (drooping). These symptoms may be an emergency. Do not wait to see if the symptoms will go away. Get medical help right away. Call your local emergency services (911 in the [place] Do not drive yourself to the hospital. This information is not intended to replace advice given to you by your health care provider. Make sure you discuss any questions you have with your health care provider. Document Released: 10/03/2007 Document Revised: 08/20/2015 Document Reviewed: 10/03/2014 Elsevier Interactive Patient Education  2017 [employer].

## 2016-05-20 ENCOUNTER — Ambulatory Visit: Payer: Self-pay | Attending: Internal Medicine

## 2016-05-20 DIAGNOSIS — Z0000 Encounter for general adult medical examination without abnormal findings: Secondary | ICD-10-CM | POA: Insufficient documentation

## 2016-05-20 NOTE — Progress Notes (Signed)
Patient here flor lab visit only

## 2016-05-21 ENCOUNTER — Telehealth: Payer: Self-pay | Admitting: *Deleted

## 2016-05-21 NOTE — Telephone Encounter (Signed)
Patient verified DOB Patient is aware of hep screening being negative. Patient had no further questions at this time.

## 2016-05-21 NOTE — Telephone Encounter (Signed)
-----   Message from Tresa Garter, MD sent at 05/21/2016  8:27 AM EDT ----- Hepatitis Screening Negative

## 2016-05-24 LAB — HIV 1/2 AB DIFFERENTIATION
HIV 1 Ab: NEGATIVE
HIV 2 Ab: NEGATIVE
NOTE (HIV CONF MULTISPOT): NEGATIVE

## 2016-05-24 LAB — HEPATITIS PANEL, ACUTE
HEP A IGM: NEGATIVE
Hep B C IgM: NEGATIVE
Hepatitis B Surface Ag: NEGATIVE

## 2016-05-24 LAB — RNA QUALITATIVE: HIV 1 RNA QUALITATIVE: NEGATIVE

## 2016-05-24 LAB — HIV ANTIBODY (ROUTINE TESTING W REFLEX): HIV SCREEN 4TH GENERATION: REACTIVE — AB

## 2016-06-05 NOTE — Progress Notes (Signed)
Please schedule the patient an appointment with Dr. Doreene Burke for repeat of labs and to discuss his results.

## 2016-06-18 ENCOUNTER — Other Ambulatory Visit: Payer: Self-pay | Admitting: Internal Medicine

## 2016-06-18 DIAGNOSIS — I10 Essential (primary) hypertension: Secondary | ICD-10-CM

## 2016-06-18 MED FILL — HYDROCHLOROTHIAZIDE 12.5 MG: 12.5 | 90 days supply | Qty: 90 | Fill #1

## 2016-06-18 MED FILL — AMITRIPTYLINE HCL 50 MG TAB: 50 | 90 days supply | Qty: 90 | Fill #1

## 2016-06-18 MED FILL — METOPROLOL SUCC ER 25 MG TA: 25 | 90 days supply | Qty: 90 | Fill #0

## 2016-06-19 ENCOUNTER — Encounter: Payer: Self-pay | Admitting: Internal Medicine

## 2016-06-19 ENCOUNTER — Ambulatory Visit: Payer: Self-pay | Attending: Internal Medicine | Admitting: Internal Medicine

## 2016-06-19 VITALS — BP 114/71 | HR 78 | Temp 98.0°F | Resp 18 | Ht 72.0 in | Wt 272.0 lb

## 2016-06-19 DIAGNOSIS — Z79899 Other long term (current) drug therapy: Secondary | ICD-10-CM | POA: Insufficient documentation

## 2016-06-19 DIAGNOSIS — I10 Essential (primary) hypertension: Secondary | ICD-10-CM

## 2016-06-19 DIAGNOSIS — G894 Chronic pain syndrome: Secondary | ICD-10-CM

## 2016-06-19 DIAGNOSIS — M51379 Other intervertebral disc degeneration, lumbosacral region without mention of lumbar back pain or lower extremity pain: Secondary | ICD-10-CM

## 2016-06-19 DIAGNOSIS — Z21 Asymptomatic human immunodeficiency virus [HIV] infection status: Secondary | ICD-10-CM | POA: Insufficient documentation

## 2016-06-19 DIAGNOSIS — Z0000 Encounter for general adult medical examination without abnormal findings: Secondary | ICD-10-CM

## 2016-06-19 DIAGNOSIS — M1990 Unspecified osteoarthritis, unspecified site: Secondary | ICD-10-CM | POA: Insufficient documentation

## 2016-06-19 DIAGNOSIS — M5137 Other intervertebral disc degeneration, lumbosacral region: Secondary | ICD-10-CM

## 2016-06-19 LAB — POCT GLYCOSYLATED HEMOGLOBIN (HGB A1C): Hemoglobin A1C: 5.8

## 2016-06-19 MED ORDER — ACETAMINOPHEN-CODEINE #3 300-30 MG PO TABS: 1.0000 | ORAL_TABLET | Freq: Four times a day (QID) | ORAL | 0 refills | Status: DC | PRN

## 2016-06-19 MED FILL — ACETAMINOPHEN/COD #3 TABLET: 300-30 | 23 days supply | Qty: 90 | Fill #0

## 2016-06-19 NOTE — Progress Notes (Signed)
Patient is here to review previous labs and repeat blood work.  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  Patient has taken medication today. Patient has eaten today.

## 2016-06-19 NOTE — Progress Notes (Signed)
Dustin [name], is a 56 y.o. male  HKV:[id number]  FIE:[id number]  DOB - [birth year]  Chief Complaint  Patient presents with  . Labs Only  . Results      Subjective:   Dustin [name] is a 56 y.o. male withhistory of gouty arthritis, hypertension, occasional headaches and osteoarthritis of the joints here today for a follow up visit and to review lab results from previous visit. HIV Screen 4th generation xRfx done on 05/20/2016 was positive. He was called in today for repeat lab with HIV-1 RNA Quantitative. He is still complaining of low back pain from bulging disc identified long time ago. Otherwise no new complaint today. He denies any suicidal ideation or thoughts. Patient has No headache, No chest pain, No abdominal pain - No Nausea, No new weakness tingling or numbness, No Cough - SOB.   Problem  HIV Antibody Positive ([facility])   ALLERGIES: No Known Allergies  PAST MEDICAL HISTORY: Past Medical History:  Diagnosis Date  . Arthritis   . DDD (degenerative disc disease)   . Gout   . Hypertension     MEDICATIONS AT HOME: Prior to Admission medications   Medication Sig Start Date End Date Taking? Authorizing Provider  acetaminophen (TYLENOL) 500 MG tablet Take 1,000 mg by mouth every 6 (six) hours as needed for mild pain, moderate pain or headache.   Yes [provider]  acetaminophen-codeine (TYLENOL #3) 300-30 MG tablet Take 1 tablet by mouth every 6 (six) hours as needed for moderate pain. 06/19/16  Yes Tresa Garter, MD  amitriptyline (ELAVIL) 50 MG tablet Take 1 tablet (50 mg total) by mouth at bedtime as needed for sleep. 01/31/16  Yes Pacer Dorn E, MD  atenolol (TENORMIN) 50 MG tablet TAKE 1 TABLET BY MOUTH DAILY 03/08/16  Yes Layla Kesling E, MD  hydrochlorothiazide (HYDRODIURIL) 12.5 MG tablet Take 1 tablet (12.5 mg total) by mouth daily. 01/31/16  Yes Tresa Garter, MD  ibuprofen (ADVIL,MOTRIN) 200 MG tablet Take 400 mg by mouth every 6  (six) hours as needed for headache, mild pain or moderate pain.   Yes [provider]  meloxicam (MOBIC) 15 MG tablet Take 1 tablet (15 mg total) by mouth daily as needed for pain. 05/08/16  Yes Tresa Garter, MD  methocarbamol (ROBAXIN) 500 MG tablet Take 1 tablet (500 mg total) by mouth 3 (three) times daily. 02/20/16  Yes Jessy Oto, MD  metoprolol succinate (TOPROL-XL) 25 MG 24 hr tablet Take 1 tablet (25 mg total) by mouth daily. 01/31/16  Yes Tresa Garter, MD    Objective:   Vitals:   06/19/16 1350  BP: 114/71  Pulse: 78  Resp: 18  Temp: 98 F (36.7 C)  TempSrc: Oral  SpO2: 97%  Weight: 272 lb (123.4 kg)  Height: 6' (1.829 m)   Exam General appearance : Awake, alert, not in any distress. Speech Clear. Not toxic looking HEENT: Atraumatic and Normocephalic, pupils equally reactive to light and accomodation Neck: Supple, no JVD. No cervical lymphadenopathy.  Chest: Good air entry bilaterally, no added sounds  CVS: S1 S2 regular, no murmurs.  Abdomen: Bowel sounds present, Non tender and not distended with no gaurding, rigidity or rebound. Extremities: B/L Lower Ext shows no edema, both legs are warm to touch Neurology: Awake alert, and oriented X 3, CN II-XII intact, Non focal Skin: No Rash  Data Review Lab Results  Component Value Date   HGBA1C 5.20 02/17/2014   HGBA1C 5.1 01/28/2013  Assessment & Plan   1. DDD (degenerative disc disease), lumbosacral  - acetaminophen-codeine (TYLENOL #3) 300-30 MG tablet; Take 1 tablet by mouth every 6 (six) hours as needed for moderate pain.  Dispense: 90 tablet; Refill: 0  2. Chronic pain syndrome  - Controlled  3. Essential hypertension: Controlled  - Lipase  4. Healthcare maintenance  - HIV-1 DNA qualitative by PCR, blood - Ambulatory referral to Gastroenterology  Patient have been counseled extensively about nutrition and exercise. Other issues discussed during this visit include: low  cholesterol diet, weight control and daily exercise,  importance of adherence with medications and regular follow-up. We also discussed long term complications of uncontrolled hypertension.   Return in about 6 months (around 12/19/2016) for Follow up Pain and comorbidities, Follow up HTN.  The patient was given clear instructions to go to ER or return to medical center if symptoms don't improve, worsen or new problems develop. The patient verbalized understanding. The patient was told to call to get lab results if they haven't heard anything in the next week.   This note has been created with Surveyor, quantity. Any transcriptional errors are unintentional.    Angelica Chessman, MD, [place], Karilyn Cota, [place] [facility] [place], [place]   06/19/2016, 2:37 PM

## 2016-06-19 NOTE — Patient Instructions (Signed)
DASH Eating Plan DASH stands for "Dietary Approaches to Stop Hypertension." The DASH eating plan is a healthy eating plan that has been shown to reduce high blood pressure (hypertension). It may also reduce your risk for type 2 diabetes, heart disease, and stroke. The DASH eating plan may also help with weight loss. What are tips for following this plan? General guidelines  Avoid eating more than 2,300 mg (milligrams) of salt (sodium) a day. If you have hypertension, you may need to reduce your sodium intake to 1,500 mg a day.  Limit alcohol intake to no more than 1 drink a day for nonpregnant women and 2 drinks a day for men. One drink equals 12 oz of beer, 5 oz of wine, or 1 oz of hard liquor.  Work with your health care provider to maintain a healthy body weight or to lose weight. Ask what an ideal weight is for you.  Get at least 30 minutes of exercise that causes your heart to beat faster (aerobic exercise) most days of the week. Activities may include walking, swimming, or biking.  Work with your health care provider or diet and nutrition specialist (dietitian) to adjust your eating plan to your individual calorie needs. Reading food labels  Check food labels for the amount of sodium per serving. Choose foods with less than 5 percent of the Daily Value of sodium. Generally, foods with less than 300 mg of sodium per serving fit into this eating plan.  To find whole grains, look for the word "whole" as the first word in the ingredient list. Shopping  Buy products labeled as "low-sodium" or "no salt added."  Buy fresh foods. Avoid canned foods and premade or frozen meals. Cooking  Avoid adding salt when cooking. Use salt-free seasonings or herbs instead of table salt or sea salt. Check with your health care provider or pharmacist before using salt substitutes.  Do not fry foods. Cook foods using healthy methods such as baking, boiling, grilling, and broiling instead.  Cook with  heart-healthy oils, such as olive, canola, soybean, or sunflower oil. Meal planning   Eat a balanced diet that includes: ? 5 or more servings of fruits and vegetables each day. At each meal, try to fill half of your plate with fruits and vegetables. ? Up to 6-8 servings of whole grains each day. ? Less than 6 oz of lean meat, poultry, or fish each day. A 3-oz serving of meat is about the same size as a deck of cards. One egg equals 1 oz. ? 2 servings of low-fat dairy each day. ? A serving of nuts, seeds, or beans 5 times each week. ? Heart-healthy fats. Healthy fats called Omega-3 fatty acids are found in foods such as flaxseeds and coldwater fish, like sardines, salmon, and mackerel.  Limit how much you eat of the following: ? Canned or prepackaged foods. ? Food that is high in trans fat, such as fried foods. ? Food that is high in saturated fat, such as fatty meat. ? Sweets, desserts, sugary drinks, and other foods with added sugar. ? Full-fat dairy products.  Do not salt foods before eating.  Try to eat at least 2 vegetarian meals each week.  Eat more home-cooked food and less restaurant, buffet, and fast food.  When eating at a restaurant, ask that your food be prepared with less salt or no salt, if possible. What foods are recommended? The items listed may not be a complete list. Talk with your dietitian about what   dietary choices are best for you. Grains Whole-grain or whole-wheat bread. Whole-grain or whole-wheat pasta. Brown rice. Oatmeal. Quinoa. Bulgur. Whole-grain and low-sodium cereals. Pita bread. Low-fat, low-sodium crackers. Whole-wheat flour tortillas. Vegetables Fresh or frozen vegetables (raw, steamed, roasted, or grilled). Low-sodium or reduced-sodium tomato and vegetable juice. Low-sodium or reduced-sodium tomato sauce and tomato paste. Low-sodium or reduced-sodium canned vegetables. Fruits All fresh, dried, or frozen fruit. Canned fruit in natural juice (without  added sugar). Meat and other protein foods Skinless chicken or turkey. Ground chicken or turkey. Pork with fat trimmed off. Fish and seafood. Egg whites. Dried beans, peas, or lentils. Unsalted nuts, nut butters, and seeds. Unsalted canned beans. Lean cuts of beef with fat trimmed off. Low-sodium, lean deli meat. Dairy Low-fat (1%) or fat-free (skim) milk. Fat-free, low-fat, or reduced-fat cheeses. Nonfat, low-sodium ricotta or cottage cheese. Low-fat or nonfat yogurt. Low-fat, low-sodium cheese. Fats and oils Soft margarine without trans fats. Vegetable oil. Low-fat, reduced-fat, or light mayonnaise and salad dressings (reduced-sodium). Canola, safflower, olive, soybean, and sunflower oils. Avocado. Seasoning and other foods Herbs. Spices. Seasoning mixes without salt. Unsalted popcorn and pretzels. Fat-free sweets. What foods are not recommended? The items listed may not be a complete list. Talk with your dietitian about what dietary choices are best for you. Grains Baked goods made with fat, such as croissants, muffins, or some breads. Dry pasta or rice meal packs. Vegetables Creamed or fried vegetables. Vegetables in a cheese sauce. Regular canned vegetables (not low-sodium or reduced-sodium). Regular canned tomato sauce and paste (not low-sodium or reduced-sodium). Regular tomato and vegetable juice (not low-sodium or reduced-sodium). Pickles. Olives. Fruits Canned fruit in a light or heavy syrup. Fried fruit. Fruit in cream or butter sauce. Meat and other protein foods Fatty cuts of meat. Ribs. Fried meat. Bacon. Sausage. Bologna and other processed lunch meats. Salami. Fatback. Hotdogs. Bratwurst. Salted nuts and seeds. Canned beans with added salt. Canned or smoked fish. Whole eggs or egg yolks. Chicken or turkey with skin. Dairy Whole or 2% milk, cream, and half-and-half. Whole or full-fat cream cheese. Whole-fat or sweetened yogurt. Full-fat cheese. Nondairy creamers. Whipped toppings.  Processed cheese and cheese spreads. Fats and oils Butter. Stick margarine. Lard. Shortening. Ghee. Bacon fat. Tropical oils, such as coconut, palm kernel, or palm oil. Seasoning and other foods Salted popcorn and pretzels. Onion salt, garlic salt, seasoned salt, table salt, and sea salt. Worcestershire sauce. Tartar sauce. Barbecue sauce. Teriyaki sauce. Soy sauce, including reduced-sodium. Steak sauce. Canned and packaged gravies. Fish sauce. Oyster sauce. Cocktail sauce. Horseradish that you find on the shelf. Ketchup. Mustard. Meat flavorings and tenderizers. Bouillon cubes. Hot sauce and Tabasco sauce. Premade or packaged marinades. Premade or packaged taco seasonings. Relishes. Regular salad dressings. Where to find more information:  National Heart, Lung, and Blood Institute: www.nhlbi.nih.gov  American Heart Association: www.heart.org Summary  The DASH eating plan is a healthy eating plan that has been shown to reduce high blood pressure (hypertension). It may also reduce your risk for type 2 diabetes, heart disease, and stroke.  With the DASH eating plan, you should limit salt (sodium) intake to 2,300 mg a day. If you have hypertension, you may need to reduce your sodium intake to 1,500 mg a day.  When on the DASH eating plan, aim to eat more fresh fruits and vegetables, whole grains, lean proteins, low-fat dairy, and heart-healthy fats.  Work with your health care provider or diet and nutrition specialist (dietitian) to adjust your eating plan to your individual   calorie needs. This information is not intended to replace advice given to you by your health care provider. Make sure you discuss any questions you have with your health care provider. Document Released: 12/13/2010 Document Revised: 12/18/2015 Document Reviewed: 12/18/2015 Elsevier Interactive Patient Education  2017 Elsevier Inc. Hypertension Hypertension, commonly called high blood pressure, is when the force of blood  pumping through the arteries is too strong. The arteries are the blood vessels that carry blood from the heart throughout the body. Hypertension forces the heart to work harder to pump blood and may cause arteries to become narrow or stiff. Having untreated or uncontrolled hypertension can cause heart attacks, strokes, kidney disease, and other problems. A blood pressure reading consists of a higher number over a lower number. Ideally, your blood pressure should be below 120/80. The first ("top") number is called the systolic pressure. It is a measure of the pressure in your arteries as your heart beats. The second ("bottom") number is called the diastolic pressure. It is a measure of the pressure in your arteries as the heart relaxes. What are the causes? The cause of this condition is not known. What increases the risk? Some risk factors for high blood pressure are under your control. Others are not. Factors you can change  Smoking.  Having type 2 diabetes mellitus, high cholesterol, or both.  Not getting enough exercise or physical activity.  Being overweight.  Having too much fat, sugar, calories, or salt (sodium) in your diet.  Drinking too much alcohol. Factors that are difficult or impossible to change  Having chronic kidney disease.  Having a family history of high blood pressure.  Age. Risk increases with age.  Race. You may be at higher risk if you are African-American.  Gender. Men are at higher risk than women before age 45. After age 65, women are at higher risk than men.  Having obstructive sleep apnea.  Stress. What are the signs or symptoms? Extremely high blood pressure (hypertensive crisis) may cause:  Headache.  Anxiety.  Shortness of breath.  Nosebleed.  Nausea and vomiting.  Severe chest pain.  Jerky movements you cannot control (seizures).  How is this diagnosed? This condition is diagnosed by measuring your blood pressure while you are  seated, with your arm resting on a surface. The cuff of the blood pressure monitor will be placed directly against the skin of your upper arm at the level of your heart. It should be measured at least twice using the same arm. Certain conditions can cause a difference in blood pressure between your right and left arms. Certain factors can cause blood pressure readings to be lower or higher than normal (elevated) for a short period of time:  When your blood pressure is higher when you are in a health care provider's office than when you are at home, this is called white coat hypertension. Most people with this condition do not need medicines.  When your blood pressure is higher at home than when you are in a health care provider's office, this is called masked hypertension. Most people with this condition may need medicines to control blood pressure.  If you have a high blood pressure reading during one visit or you have normal blood pressure with other risk factors:  You may be asked to return on a different day to have your blood pressure checked again.  You may be asked to monitor your blood pressure at home for 1 week or longer.  If you are diagnosed   with hypertension, you may have other blood or imaging tests to help your health care provider understand your overall risk for other conditions. How is this treated? This condition is treated by making healthy lifestyle changes, such as eating healthy foods, exercising more, and reducing your alcohol intake. Your health care provider may prescribe medicine if lifestyle changes are not enough to get your blood pressure under control, and if:  Your systolic blood pressure is above 130.  Your diastolic blood pressure is above 80.  Your personal target blood pressure may vary depending on your medical conditions, your age, and other factors. Follow these instructions at home: Eating and drinking  Eat a diet that is high in fiber and potassium,  and low in sodium, added sugar, and fat. An example eating plan is called the DASH (Dietary Approaches to Stop Hypertension) diet. To eat this way: ? Eat plenty of fresh fruits and vegetables. Try to fill half of your plate at each meal with fruits and vegetables. ? Eat whole grains, such as whole wheat pasta, brown rice, or whole grain bread. Fill about one quarter of your plate with whole grains. ? Eat or drink low-fat dairy products, such as skim milk or low-fat yogurt. ? Avoid fatty cuts of meat, processed or cured meats, and poultry with skin. Fill about one quarter of your plate with lean proteins, such as fish, chicken without skin, beans, eggs, and tofu. ? Avoid premade and processed foods. These tend to be higher in sodium, added sugar, and fat.  Reduce your daily sodium intake. Most people with hypertension should eat less than 1,500 mg of sodium a day.  Limit alcohol intake to no more than 1 drink a day for nonpregnant women and 2 drinks a day for men. One drink equals 12 oz of beer, 5 oz of wine, or 1 oz of hard liquor. Lifestyle  Work with your health care provider to maintain a healthy body weight or to lose weight. Ask what an ideal weight is for you.  Get at least 30 minutes of exercise that causes your heart to beat faster (aerobic exercise) most days of the week. Activities may include walking, swimming, or biking.  Include exercise to strengthen your muscles (resistance exercise), such as pilates or lifting weights, as part of your weekly exercise routine. Try to do these types of exercises for 30 minutes at least 3 days a week.  Do not use any products that contain nicotine or tobacco, such as cigarettes and e-cigarettes. If you need help quitting, ask your health care provider.  Monitor your blood pressure at home as told by your health care provider.  Keep all follow-up visits as told by your health care provider. This is important. Medicines  Take over-the-counter and  prescription medicines only as told by your health care provider. Follow directions carefully. Blood pressure medicines must be taken as prescribed.  Do not skip doses of blood pressure medicine. Doing this puts you at risk for problems and can make the medicine less effective.  Ask your health care provider about side effects or reactions to medicines that you should watch for. Contact a health care provider if:  You think you are having a reaction to a medicine you are taking.  You have headaches that keep coming back (recurring).  You feel dizzy.  You have swelling in your ankles.  You have trouble with your vision. Get help right away if:  You develop a severe headache or confusion.  You   have unusual weakness or numbness.  You feel faint.  You have severe pain in your chest or abdomen.  You vomit repeatedly.  You have trouble breathing. Summary  Hypertension is when the force of blood pumping through your arteries is too strong. If this condition is not controlled, it may put you at risk for serious complications.  Your personal target blood pressure may vary depending on your medical conditions, your age, and other factors. For most people, a normal blood pressure is less than 120/80.  Hypertension is treated with lifestyle changes, medicines, or a combination of both. Lifestyle changes include weight loss, eating a healthy, low-sodium diet, exercising more, and limiting alcohol. This information is not intended to replace advice given to you by your health care provider. Make sure you discuss any questions you have with your health care provider. Document Released: 12/24/2004 Document Revised: 11/22/2015 Document Reviewed: 11/22/2015 Elsevier Interactive Patient Education  2018 Elsevier Inc.  

## 2016-06-21 LAB — HIV-1 RNA, QUALITATIVE, TMA: HIV-1 RNA Qualitative: NEGATIVE

## 2016-06-21 LAB — LIPASE: LIPASE: 27 U/L (ref 13–78)

## 2016-06-27 ENCOUNTER — Telehealth (INDEPENDENT_AMBULATORY_CARE_PROVIDER_SITE_OTHER): Payer: Self-pay | Admitting: *Deleted

## 2016-06-27 NOTE — Telephone Encounter (Signed)
-----   Message from Tresa Garter, MD sent at 06/22/2016  5:18 PM EDT ----- HIV test is negative.

## 2016-06-27 NOTE — Telephone Encounter (Signed)
MA informed patient of HIV screening being negative. Medical Assistant left message on patient's home and cell voicemail. Voicemail states to give a call back to [place] with [facility] at [phone].

## 2016-07-12 ENCOUNTER — Telehealth: Payer: Self-pay | Admitting: Internal Medicine

## 2016-07-12 NOTE — Telephone Encounter (Signed)
Pt. Called requesting to speak with the nurse regarding a form that he dropped up on 07/08/16. Pt. Just needs to ask his nurse a question. Please f/u with pt.

## 2016-07-22 ENCOUNTER — Telehealth: Payer: Self-pay | Admitting: Gastroenterology

## 2016-07-23 NOTE — Telephone Encounter (Signed)
Dr.Stark reviewed records and accepted for patient to be scheduled for a direct colonoscopy. Left message for patient notifying them of this and to call back and schedule appointment.

## 2016-07-24 ENCOUNTER — Ambulatory Visit (HOSPITAL_BASED_OUTPATIENT_CLINIC_OR_DEPARTMENT_OTHER): Payer: No Typology Code available for payment source | Attending: Internal Medicine | Admitting: Internal Medicine

## 2016-07-24 VITALS — Ht 72.0 in | Wt 275.0 lb

## 2016-07-24 DIAGNOSIS — G4733 Obstructive sleep apnea (adult) (pediatric): Secondary | ICD-10-CM

## 2016-07-24 DIAGNOSIS — I493 Ventricular premature depolarization: Secondary | ICD-10-CM | POA: Insufficient documentation

## 2016-07-28 DIAGNOSIS — G4733 Obstructive sleep apnea (adult) (pediatric): Secondary | ICD-10-CM

## 2016-07-28 NOTE — Procedures (Signed)
  Patient Name: Dustin [name], Dustin [name] Date: 07/24/2016 Gender: Male D.O.B: [birth year] Age (years): 41 Referring Provider: Angelica Chessman Height (inches): 72 Interpreting Physician: Baird Lyons MD, ABSM Weight (lbs): 275 RPSGT: Zadie Rhine BMI: 37 MRN: [id number] Neck Size: 19.00 CLINICAL INFORMATION Sleep Study Type: NPSG  Indication for sleep study: OSA  Epworth Sleepiness Score: 6  SLEEP STUDY TECHNIQUE As per the AASM Manual for the Scoring of Sleep and Associated Events v2.3 (April 2016) with a hypopnea requiring 4% desaturations.  The channels recorded and monitored were frontal, central and occipital EEG, electrooculogram (EOG), submentalis EMG (chin), nasal and oral airflow, thoracic and abdominal wall motion, anterior tibialis EMG, snore microphone, electrocardiogram, and pulse oximetry.  MEDICATIONS Medications self-administered by patient taken the night of the study : [place] The study was initiated at 10:32:04 PM and ended at 4:29:19 AM.  Sleep onset time was 15.6 minutes and the sleep efficiency was 78.1%. The total sleep time was 279.0 minutes.  Stage REM latency was 278.5 minutes.  The patient spent 3.58% of the night in stage N1 sleep, 91.22% in stage N2 sleep, 0.00% in stage N3 and 5.20% in REM.  Alpha intrusion was absent.  Supine sleep was 24.01%.  RESPIRATORY PARAMETERS The overall apnea/hypopnea index (AHI) was 11.8 per hour. There were 1 total apneas, including 1 obstructive, 0 central and 0 mixed apneas. There were 54 hypopneas and 172 RERAs.  The AHI during Stage REM sleep was 24.8 per hour.  AHI while supine was 23.3 per hour.  The mean oxygen saturation was 91.45%. The minimum SpO2 during sleep was 87.00%.  Moderate snoring was noted during this study.  CARDIAC DATA The 2 lead EKG demonstrated sinus rhythm. The mean heart rate was 68.07 beats per minute. Other EKG findings include: PVCs.  LEG MOVEMENT  DATA The total PLMS were 0 with a resulting PLMS index of 0.00. Associated arousal with leg movement index was 0.0 .  IMPRESSIONS - Mild obstructive sleep apnea occurred during this study (AHI = 11.8/h). - Insufficient early events to meet protocol requirements for split CPAP titration. - No significant central sleep apnea occurred during this study (CAI = 0.0/h). - Mild oxygen desaturation was noted during this study (Min O2 = 87.00%). - The patient snored with Moderate snoring volume. - EKG findings include PVCs. - Clinically significant periodic limb movements did not occur during sleep. No significant associated arousals.  DIAGNOSIS - Obstructive Sleep Apnea (327.23 [G47.33 ICD-10])  RECOMMENDATIONS - Suggest CPAP titration or a fitted oral appliance to address obstructive apnea with scores in this range. - Positional therapy avoiding supine position during sleep. - Avoid alcohol, sedatives and other CNS depressants that may worsen sleep apnea and disrupt normal sleep architecture. - Sleep hygiene should be reviewed to assess factors that may improve sleep quality. - Weight management and regular exercise should be initiated or continued if appropriate.  [Electronically signed] 07/28/2016 01:12 PM  Baird Lyons MD, [place], American Board of Sleep Medicine   NPI: [id number]  [place], American Board of Sleep Medicine  ELECTRONICALLY SIGNED ON:  07/28/2016, 1:09 PM [place] PH: [phone]   FX: [phone] [place]

## 2016-08-02 ENCOUNTER — Encounter: Payer: Self-pay | Admitting: Gastroenterology

## 2016-08-09 ENCOUNTER — Ambulatory Visit: Payer: Self-pay | Attending: Internal Medicine

## 2016-08-11 ENCOUNTER — Other Ambulatory Visit: Payer: Self-pay | Admitting: Internal Medicine

## 2016-08-11 DIAGNOSIS — G4733 Obstructive sleep apnea (adult) (pediatric): Secondary | ICD-10-CM

## 2016-08-20 ENCOUNTER — Telehealth: Payer: Self-pay | Admitting: Internal Medicine

## 2016-08-20 ENCOUNTER — Ambulatory Visit: Payer: Self-pay | Attending: Internal Medicine

## 2016-08-20 DIAGNOSIS — G4733 Obstructive sleep apnea (adult) (pediatric): Secondary | ICD-10-CM

## 2016-08-20 NOTE — Telephone Encounter (Signed)
Coralyn Mark from [facility] called the office to inform PCP that the referral that was placed for patient at their center cannot be done. Coralyn Mark is asking patient to switch it to CPACP saturation and resend referral. Please follow up.  Thank you.

## 2016-08-22 NOTE — Telephone Encounter (Signed)
Spoke to [place] from [facility]:  O2 titration was ordered but needs an order for  a regular titration for CPAP machine.  Sleep center will call patient to schedule sleep study.

## 2016-08-22 NOTE — Telephone Encounter (Signed)
Patients order was placed for CPAP titration and not O2 titration.

## 2016-08-23 NOTE — Telephone Encounter (Signed)
Thanks

## 2016-09-04 ENCOUNTER — Ambulatory Visit (AMBULATORY_SURGERY_CENTER): Payer: Self-pay

## 2016-09-04 VITALS — Ht 72.0 in | Wt 279.0 lb

## 2016-09-04 DIAGNOSIS — Z860100 Personal history of colon polyps, unspecified: Secondary | ICD-10-CM

## 2016-09-04 MED ORDER — SUPREP BOWEL PREP KIT 17.5-3.13-1.6 GM/177ML PO SOLN: 1.0000 | Freq: Once | ORAL | 0 refills | Status: AC

## 2016-09-04 NOTE — Progress Notes (Signed)
No diet meds No home oxygen No past problems with anesthesia No allergies to eggs or soy  Declined emmi 

## 2016-09-18 ENCOUNTER — Ambulatory Visit (AMBULATORY_SURGERY_CENTER): Payer: Self-pay | Admitting: Gastroenterology

## 2016-09-18 ENCOUNTER — Encounter: Payer: Self-pay | Admitting: Gastroenterology

## 2016-09-18 VITALS — BP 112/70 | HR 61 | Temp 97.5°F | Resp 12 | Ht 72.0 in | Wt 279.0 lb

## 2016-09-18 DIAGNOSIS — K635 Polyp of colon: Secondary | ICD-10-CM

## 2016-09-18 DIAGNOSIS — D121 Benign neoplasm of appendix: Secondary | ICD-10-CM

## 2016-09-18 DIAGNOSIS — D125 Benign neoplasm of sigmoid colon: Secondary | ICD-10-CM

## 2016-09-18 DIAGNOSIS — Z86010 Personal history of colonic polyps: Secondary | ICD-10-CM

## 2016-09-18 DIAGNOSIS — D129 Benign neoplasm of anus and anal canal: Secondary | ICD-10-CM

## 2016-09-18 DIAGNOSIS — D123 Benign neoplasm of transverse colon: Secondary | ICD-10-CM

## 2016-09-18 DIAGNOSIS — D128 Benign neoplasm of rectum: Secondary | ICD-10-CM

## 2016-09-18 MED ORDER — SODIUM CHLORIDE 0.9 % IV SOLN: 500.0000 mL | INTRAVENOUS | Status: DC

## 2016-09-18 NOTE — Progress Notes (Signed)
Called to room to assist during endoscopic procedure.  Patient ID and intended procedure confirmed with present staff. Received instructions for my participation in the procedure from the performing physician.  

## 2016-09-18 NOTE — Progress Notes (Signed)
To PACU VSS report to RN.tb

## 2016-09-18 NOTE — Patient Instructions (Signed)
YOU HAD AN ENDOSCOPIC PROCEDURE TODAY AT THE [facility]:   Refer to the procedure report that was given to you for any specific questions about what was found during the examination.  If the procedure report does not answer your questions, please call your gastroenterologist to clarify.  If you requested that your care partner not be given the details of your procedure findings, then the procedure report has been included in a sealed envelope for you to review at your convenience later.  YOU SHOULD EXPECT: Some feelings of bloating in the abdomen. Passage of more gas than usual.  Walking can help get rid of the air that was put into your GI tract during the procedure and reduce the bloating. If you had a lower endoscopy (such as a colonoscopy or flexible sigmoidoscopy) you may notice spotting of blood in your stool or on the toilet paper. If you underwent a bowel prep for your procedure, you may not have a normal bowel movement for a few days.  Please Note:  You might notice some irritation and congestion in your nose or some drainage.  This is from the oxygen used during your procedure.  There is no need for concern and it should clear up in a day or so.  SYMPTOMS TO REPORT IMMEDIATELY:   Following lower endoscopy (colonoscopy or flexible sigmoidoscopy):  Excessive amounts of blood in the stool  Significant tenderness or worsening of abdominal pains  Swelling of the abdomen that is new, acute  Fever of 100F or higher    For urgent or emergent issues, a gastroenterologist can be reached at any hour by calling [phone].   DIET:  We do recommend a small meal at first, but then you may proceed to your regular diet.  Drink plenty of fluids but you should avoid alcoholic beverages for 24 hours.  ACTIVITY:  You should plan to take it easy for the rest of today and you should NOT DRIVE or use heavy machinery until tomorrow (because of the sedation medicines used during the test).     FOLLOW UP: Our staff will call the number listed on your records the next business day following your procedure to check on you and address any questions or concerns that you may have regarding the information given to you following your procedure. If we do not reach you, we will leave a message.  However, if you are feeling well and you are not experiencing any problems, there is no need to return our call.  We will assume that you have returned to your regular daily activities without incident.  If any biopsies were taken you will be contacted by phone or by letter within the next 1-3 weeks.  Please call us at [phone] if you have not heard about the biopsies in 3 weeks.    SIGNATURES/CONFIDENTIALITY: You and/or your care partner have signed paperwork which will be entered into your electronic medical record.  These signatures attest to the fact that that the information above on your After Visit Summary has been reviewed and is understood.  Full responsibility of the confidentiality of this discharge information lies with you and/or your care-partner.   Resume medications. Information given on polyps. 

## 2016-09-18 NOTE — Op Note (Signed)
[facility] Patient Name: Dustin [name] Procedure Date: 09/18/2016 9:01 AM MRN: [id number] Endoscopist: Ladene Artist , MD Age: 56 Referring MD:  Date of Birth: 11/15/1960 Gender: Male Account #: [id number] Procedure:                Colonoscopy Indications:              Surveillance: Personal history of adenomatous                            polyps on last colonoscopy > 5 years ago Medicines:                Monitored Anesthesia Care Procedure:                Pre-Anesthesia Assessment:                           - Prior to the procedure, a History and Physical                            was performed, and patient medications and                            allergies were reviewed. The patient's tolerance of                            previous anesthesia was also reviewed. The risks                            and benefits of the procedure and the sedation                            options and risks were discussed with the patient.                            All questions were answered, and informed consent                            was obtained. Prior Anticoagulants: The patient has                            taken no previous anticoagulant or antiplatelet                            agents. ASA Grade Assessment: II - A patient with                            mild systemic disease. After reviewing the risks                            and benefits, the patient was deemed in                            satisfactory condition to undergo the procedure.  After obtaining informed consent, the colonoscope                            was passed under direct vision. Throughout the                            procedure, the patient's blood pressure, pulse, and                            oxygen saturations were monitored continuously. The                            Model [id number]-[id number]) scope was introduced                            through the anus and  advanced to the the cecum,                            identified by appendiceal orifice and ileocecal                            valve. The ileocecal valve, appendiceal orifice,                            and rectum were photographed. The quality of the                            bowel preparation was adequate. The colonoscopy was                            performed without difficulty. The patient tolerated                            the procedure well. Scope In: 9:08:11 AM Scope Out: 9:24:19 AM Scope Withdrawal Time: 0 hours 14 minutes 36 seconds  Total Procedure Duration: 0 hours 16 minutes 8 seconds  Findings:                 The perianal and digital rectal examinations were                            normal.                           Five sessile polyps were found in the rectum,                            sigmoid colon, transverse colon and appendiceal                            orifice. The polyps were 5 to 7 mm in size. These                            polyps were removed with a cold snare. Resection  and retrieval were complete.                           The exam was otherwise without abnormality on                            direct and retroflexion views. Complications:            No immediate complications. Estimated blood loss:                            None. Estimated Blood Loss:     Estimated blood loss: none. Impression:               - Five 5 to 7 mm polyps in the rectum, in the                            sigmoid colon, in the transverse colon and at the                            appendiceal orifice, removed with a cold snare.                            Resected and retrieved.                           - The examination was otherwise normal on direct                            and retroflexion views. Recommendation:           - Repeat colonoscopy in 3-5 years, date to be                            determined after pending pathology results are                             reviewed for surveillance.                           - Patient has a contact number available for                            emergencies. The signs and symptoms of potential                            delayed complications were discussed with the                            patient. Return to normal activities tomorrow.                            Written discharge instructions were provided to the                            patient.                           -  Resume previous diet.                           - Continue present medications.                           - Await pathology results. Ladene Artist, MD 09/18/2016 9:31:59 AM This report has been signed electronically.

## 2016-09-19 ENCOUNTER — Telehealth: Payer: Self-pay | Admitting: *Deleted

## 2016-09-19 NOTE — Telephone Encounter (Signed)
  Follow up Call-  Call back number 09/18/2016  Post procedure Call Back phone  # [phone]  Permission to leave phone message Yes  Some recent data might be hidden     Patient questions:  Do you have a fever, pain , or abdominal swelling? No. Pain Score  0 *  Have you tolerated food without any problems? Yes.    Have you been able to return to your normal activities? Yes.    Do you have any questions about your discharge instructions: Diet   No. Medications  No. Follow up visit  No.  Do you have questions or concerns about your Care? No.  Actions: * If pain score is 4 or above: No action needed, pain <4.

## 2016-09-26 ENCOUNTER — Encounter: Payer: Self-pay | Admitting: Gastroenterology

## 2016-10-07 MED FILL — AMITRIPTYLINE HCL 50 MG TAB: 50 | 90 days supply | Qty: 90 | Fill #2

## 2016-10-10 MED FILL — HYDROCHLOROTHIAZIDE 12.5 MG: 12.5 | 90 days supply | Qty: 90 | Fill #2

## 2016-10-10 MED FILL — METOPROLOL SUCC ER 25 MG TA: 25 | 90 days supply | Qty: 90 | Fill #1

## 2017-02-19 ENCOUNTER — Other Ambulatory Visit: Payer: Self-pay | Admitting: Internal Medicine

## 2017-02-19 DIAGNOSIS — I10 Essential (primary) hypertension: Secondary | ICD-10-CM

## 2017-02-19 DIAGNOSIS — M5137 Other intervertebral disc degeneration, lumbosacral region: Secondary | ICD-10-CM

## 2017-02-20 MED FILL — METOPROLOL SUCCINATE ER 25: 25 | 30 days supply | Qty: 30 | Fill #0

## 2017-02-20 MED FILL — ?HYDROCHLOROTHIAZIDE 12.5MG: 12.5 | 30 days supply | Qty: 30 | Fill #0

## 2017-02-20 MED FILL — ?AMITRIPTYLINE HCL 50MG TAB: 50 | 30 days supply | Qty: 30 | Fill #0

## 2017-02-21 ENCOUNTER — Ambulatory Visit: Payer: Self-pay | Attending: Internal Medicine | Admitting: Internal Medicine

## 2017-02-21 ENCOUNTER — Encounter: Payer: Self-pay | Admitting: Internal Medicine

## 2017-02-21 ENCOUNTER — Ambulatory Visit: Payer: Self-pay

## 2017-02-21 VITALS — BP 146/92 | HR 59 | Temp 97.9°F | Resp 16 | Ht 74.0 in | Wt 261.8 lb

## 2017-02-21 DIAGNOSIS — D126 Benign neoplasm of colon, unspecified: Secondary | ICD-10-CM | POA: Insufficient documentation

## 2017-02-21 DIAGNOSIS — M5137 Other intervertebral disc degeneration, lumbosacral region: Secondary | ICD-10-CM | POA: Insufficient documentation

## 2017-02-21 DIAGNOSIS — R634 Abnormal weight loss: Secondary | ICD-10-CM | POA: Insufficient documentation

## 2017-02-21 DIAGNOSIS — G8929 Other chronic pain: Secondary | ICD-10-CM

## 2017-02-21 DIAGNOSIS — M545 Low back pain, unspecified: Secondary | ICD-10-CM

## 2017-02-21 DIAGNOSIS — Z6833 Body mass index (BMI) 33.0-33.9, adult: Secondary | ICD-10-CM | POA: Insufficient documentation

## 2017-02-21 DIAGNOSIS — G894 Chronic pain syndrome: Secondary | ICD-10-CM | POA: Insufficient documentation

## 2017-02-21 DIAGNOSIS — F17210 Nicotine dependence, cigarettes, uncomplicated: Secondary | ICD-10-CM | POA: Insufficient documentation

## 2017-02-21 DIAGNOSIS — Z8261 Family history of arthritis: Secondary | ICD-10-CM | POA: Insufficient documentation

## 2017-02-21 DIAGNOSIS — Z79899 Other long term (current) drug therapy: Secondary | ICD-10-CM | POA: Insufficient documentation

## 2017-02-21 DIAGNOSIS — M542 Cervicalgia: Secondary | ICD-10-CM | POA: Insufficient documentation

## 2017-02-21 DIAGNOSIS — M170 Bilateral primary osteoarthritis of knee: Secondary | ICD-10-CM | POA: Insufficient documentation

## 2017-02-21 DIAGNOSIS — G4733 Obstructive sleep apnea (adult) (pediatric): Secondary | ICD-10-CM | POA: Insufficient documentation

## 2017-02-21 DIAGNOSIS — Z125 Encounter for screening for malignant neoplasm of prostate: Secondary | ICD-10-CM | POA: Insufficient documentation

## 2017-02-21 DIAGNOSIS — I10 Essential (primary) hypertension: Secondary | ICD-10-CM | POA: Insufficient documentation

## 2017-02-21 DIAGNOSIS — F17200 Nicotine dependence, unspecified, uncomplicated: Secondary | ICD-10-CM

## 2017-02-21 DIAGNOSIS — Z833 Family history of diabetes mellitus: Secondary | ICD-10-CM | POA: Insufficient documentation

## 2017-02-21 DIAGNOSIS — D229 Melanocytic nevi, unspecified: Secondary | ICD-10-CM

## 2017-02-21 DIAGNOSIS — D224 Melanocytic nevi of scalp and neck: Secondary | ICD-10-CM | POA: Insufficient documentation

## 2017-02-21 MED ORDER — HYDROCHLOROTHIAZIDE 12.5 MG PO CAPS: 12.5000 mg | ORAL_CAPSULE | Freq: Every day | ORAL | 3 refills | Status: DC

## 2017-02-21 MED ORDER — AMITRIPTYLINE HCL 50 MG PO TABS: 50.0000 mg | ORAL_TABLET | Freq: Every evening | ORAL | 3 refills | Status: DC | PRN

## 2017-02-21 MED ORDER — ACETAMINOPHEN-CODEINE #3 300-30 MG PO TABS: 1.0000 | ORAL_TABLET | Freq: Three times a day (TID) | ORAL | 1 refills | Status: DC | PRN

## 2017-02-21 MED ORDER — METOPROLOL SUCCINATE ER 25 MG PO TB24: 25.0000 mg | ORAL_TABLET | Freq: Every day | ORAL | 3 refills | Status: DC

## 2017-02-21 NOTE — Progress Notes (Signed)
Patient ID: Dustin [name], male    DOB: [birth year]  MRN: [id number]  CC: re-establish and Hypertension   Subjective: Dustin [name] is a 57 y.o. male who presents for chronic ds management.  Pt of Dr. Doreene Burke presenting to est care with me as PCP. His concerns today include:  Pt with hx of HTN, OA, DD LS spine, false positive HIV testing 2018, tob dep   1.  Since last visit patient reports he had c-scope by Dr. Fuller Plan. Had polyps removed.  Did not get results.  I went over results withhim by reading Dr. Lynne Leader letter, which he did not receive.  Will need repeat in 3  Yrs -reports soft stools every couple of days.  Small amount of blood in stools at times even before colonoscopy -loss 17 lbs unintentionally since 09/2016.  Was taking care of ex-wife x several mths post hip surgery. Wife lives in [place].  Also helping to take care for his  Mom.  Thinks stress may have contributed -getting in meals on time -endorses intermittent flushing/sweating -no chronic cough  2.  Tob: 4-5 cig/day.  He has cut back.  He was 1-2 pk/day.  He knows he needs to quit but not really want to.  Smoked since 1983  3. HTN: out of HCTZ for 1 mth.  Compliant with metoprolol  4.  Complaining of chronic pain in all joints including lower back, hips, knees.  Had negative ANA, rheumatoid factor, CCP and sed rate in 2015. -Pain in knees LT>RT.  Known OA  -Most of the pain is in his lower back -Had surgery on the C-spine by Dr. Lorin Mercy for disc issue.  States he was told that nothing much can be done about the lower back.  Would like to be referred to pain specialist once he is approved for the orange card.  He has been taking extra strength Tylenol.  Requesting refill on Tylenol 3's which he used to received from Dr. Doreene Burke. -Pain in lower back is constant and sometimes goes into the legs.. Gets stiff after prolonged sitting.  No loss of bowel or bladder function.  Last MRI of the lumbar spine done January 2018  revealed: IMPRESSION: 1. New right subarticular L4-L5 disc extrusion with mild superior migration and resultant narrowing of the right lateral recess. Correlate for right L5 radiculopathy. Moderate right foraminal stenosis at this level also may contribute to a right L4 radiculopathy. 2. Bilateral moderate neural foraminal narrowing at L5-S1, unchanged. 3. Unchanged moderate left L3-L4 neural foraminal narrowing.  5.  He had a false negative HIV test last year.  Patient reports that he was not aware of this.  He has not been sexually active in the past 3 years  Patient Active Problem List   Diagnosis Date Noted  . 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 OSA (obstructive sleep apnea) 05/15/2016  . Neck pain 03/05/2016  . Low back pain 03/05/2016  . DDD (degenerative disc disease), lumbosacral 01/31/2016  . Colon cancer screening 03/02/2015  . Acute meniscal tear of left knee 07/05/2014  . Left knee pain 06/16/2014  . Uncontrolled hypertension 06/16/2014  . Preventative health care 02/17/2014  . Other headache syndrome 02/17/2014  . Chronic pain syndrome 02/17/2014  . Medication overuse headache 05/06/2013  . Dental caries 04/06/2013  . Headache(784.0) 04/06/2013     No current outpatient medications on file prior to visit.   Current Facility-Administered Medications on File Prior to Visit  Medication Dose Route Frequency Provider Last  Rate Last Dose  . 0.9 %  sodium chloride infusion  500 mL Intravenous Continuous Ladene Artist, MD        No Known Allergies  Social History   Socioeconomic History  . Marital status: Legally Separated    Spouse name: Not on file  . Number of children: Not on file  . Years of education: Not on file  . Highest education level: Not on file  Social Needs  . Financial resource strain: Not on file  . Food insecurity - worry: Not on file  . Food insecurity - inability: Not on file  . Transportation needs - medical: Not on file  .  Transportation needs - non-medical: Not on file  Occupational History  . Not on file  Tobacco Use  . Smoking status: Current Every Day Smoker    Packs/day: 0.50    Types: Cigarettes  . Smokeless tobacco: Never Used  Substance and Sexual Activity  . Alcohol use: Yes    Alcohol/week: 0.0 oz    Comment: occasionally- "3 shots a month"  . Drug use: No  . Sexual activity: Not Currently    Partners: Female  Other Topics Concern  . Not on file  Social History Narrative  . Not on file    Family History  Problem Relation Age of Onset  . Rheum arthritis Mother   . Cancer Father   . Diabetes Father     Past Surgical History:  Procedure Laterality Date  . ANTERIOR CRUCIATE LIGAMENT REPAIR    . NECK SURGERY      ROS: Review of Systems Negative except as stated above PHYSICAL EXAM: BP (!) 146/92   Pulse (!) 59   Temp 97.9 F (36.6 C) (Oral)   Resp 16   Ht 6\' 2"  (1.88 m)   Wt 261 lb 12.8 oz (118.8 kg)   SpO2 97%   BMI 33.61 kg/m   Wt Readings from Last 3 Encounters:  02/21/17 261 lb 12.8 oz (118.8 kg)  09/18/16 279 lb (126.6 kg)  09/04/16 279 lb (126.6 kg)    Physical Exam  General appearance - alert, well appearing, middle-aged Caucasian male and in no distress Mental status - alert, oriented to person, place, and time, normal mood, behavior.  Patient very talkative Mouth - mucous membranes moist, pharynx normal without lesions Neck - supple, no significant adenopathy.  No thyroid enlargement Chest - clear to auscultation, no wheezes, rales or rhonchi, symmetric air entry Heart - normal rate, regular rhythm, normal S1, S2, no murmurs, rubs, clicks or gallops Musculoskeletal -mild tenderness on palpation of lumbar spine and paraspinal muscles.  Knees: Positive joint enlargement.  No point tenderness.  Mild crepitus on passive movement of the left knee Extremities -lower extremity edema.   Skin -1.5 cm multicolored mole versus keratosis on the top of  scalp  ASSESSMENT AND PLAN: 1. Essential hypertension Not at goal.  Refill metoprolol and HCTZ.  Low-salt diet. - Comprehensive metabolic panel - CBC - Lipid panel - hydrochlorothiazide (MICROZIDE) 12.5 MG capsule; Take 1 capsule (12.5 mg total) by mouth daily.  Dispense: 90 capsule; Refill: 3 - metoprolol succinate (TOPROL-XL) 25 MG 24 hr tablet; Take 1 tablet (25 mg total) by mouth daily.  Dispense: 90 tablet; Refill: 3  2. Tobacco dependence Patient advised to quit smoking. Discussed health risks associated with smoking including lung and other types of cancers, chronic lung diseases and CV risks.. Pt not ready to give trail of quitting.    3. Chronic  bilateral low back pain without sciatica 4. Osteoarthritis of both knees, unspecified osteoarthritis type -We discussed getting him on a controlled substance agreement to prescribe Tylenol 3's.  Patient denied any street drug use to me.  However at the end of the visit and my CMA went in to go over with the controlled substance prescribing agreement with him patient told her that he had used a little bit of marijuana about a week or so ago.  So I held off on prescribing the Tylenol 3's.  Advised that we will do the urine drug screen today and again on follow-up visit in 4-6 weeks.  If negative for marijuana on that visit we can consider prescribing the Tylenol 3's - [id number] 11+Oxyco+Alc+Crt-Bund   5. Adenomatous polyp of colon, unspecified part of colon I went over the results of his colonoscopy and pathology.  Will need repeat colonoscopy in 3 years  6. Unintentional weight loss - TSH  7. Prostate cancer screening Patient agreeable to PSA screening - PSA  8. Change in mole Will refer to Derm once he is approved for the orange card   Patient was given the opportunity to ask questions.  Patient verbalized understanding of the plan and was able to repeat key elements of the plan.   Orders Placed This Encounter  Procedures  .  Comprehensive metabolic panel  . CBC  . Lipid panel  . PSA  . TSH  . [id number] 11+Oxyco+Alc+Crt-Bund     Requested Prescriptions   Signed Prescriptions Disp Refills  . amitriptyline (ELAVIL) 50 MG tablet 90 tablet 3    Sig: Take 1 tablet (50 mg total) by mouth at bedtime as needed. for sleep  . hydrochlorothiazide (MICROZIDE) 12.5 MG capsule 90 capsule 3    Sig: Take 1 capsule (12.5 mg total) by mouth daily.  . metoprolol succinate (TOPROL-XL) 25 MG 24 hr tablet 90 tablet 3    Sig: Take 1 tablet (25 mg total) by mouth daily.    Return in about 6 weeks (around 04/04/2017).  Karle Plumber, MD, FACP

## 2017-02-22 LAB — CBC
HEMATOCRIT: 50.7 % (ref 37.5–51.0)
HEMOGLOBIN: 16.7 g/dL (ref 13.0–17.7)
MCH: 31.3 pg (ref 26.6–33.0)
MCHC: 32.9 g/dL (ref 31.5–35.7)
MCV: 95 fL (ref 79–97)
Platelets: 293 10*3/uL (ref 150–379)
RBC: 5.33 x10E6/uL (ref 4.14–5.80)
RDW: 13.8 % (ref 12.3–15.4)
WBC: 8.3 10*3/uL (ref 3.4–10.8)

## 2017-02-22 LAB — LIPID PANEL
CHOLESTEROL TOTAL: 162 mg/dL (ref 100–199)
Chol/HDL Ratio: 4.6 ratio (ref 0.0–5.0)
HDL: 35 mg/dL — AB (ref >39)
LDL Calculated: 79 mg/dL (ref 0–99)
TRIGLYCERIDES: 239 mg/dL — AB (ref 0–149)
VLDL Cholesterol Cal: 48 mg/dL — ABNORMAL HIGH (ref 5–40)

## 2017-02-22 LAB — COMPREHENSIVE METABOLIC PANEL
ALK PHOS: 65 IU/L (ref 39–117)
ALT: 25 IU/L (ref 0–44)
AST: 23 IU/L (ref 0–40)
Albumin/Globulin Ratio: 1.9 (ref 1.2–2.2)
Albumin: 4.8 g/dL (ref 3.5–5.5)
BILIRUBIN TOTAL: 0.3 mg/dL (ref 0.0–1.2)
BUN/Creatinine Ratio: 14 (ref 9–20)
BUN: 15 mg/dL (ref 6–24)
CHLORIDE: 103 mmol/L (ref 96–106)
CO2: 22 mmol/L (ref 20–29)
Calcium: 9.6 mg/dL (ref 8.7–10.2)
Creatinine, Ser: 1.08 mg/dL (ref 0.76–1.27)
GFR calc Af Amer: 89 mL/min/{1.73_m2} (ref >59)
GFR calc non Af Amer: 77 mL/min/{1.73_m2} (ref >59)
GLUCOSE: 94 mg/dL (ref 65–99)
Globulin, Total: 2.5 g/dL (ref 1.5–4.5)
Potassium: 5.2 mmol/L (ref 3.5–5.2)
Sodium: 141 mmol/L (ref 134–144)
Total Protein: 7.3 g/dL (ref 6.0–8.5)

## 2017-02-22 LAB — PSA: PROSTATE SPECIFIC AG, SERUM: 0.4 ng/mL (ref 0.0–4.0)

## 2017-02-22 LAB — TSH: TSH: 0.955 u[IU]/mL (ref 0.450–4.500)

## 2017-02-24 ENCOUNTER — Telehealth: Payer: Self-pay

## 2017-02-24 NOTE — Telephone Encounter (Signed)
Contacted pt to go over lab results pt didn't answer lvm asking pt to give me a call at his earliest convenience  If pt calls back please give results: kidney/Liver Function Test normal. Blood count normal. PSA normal. Triglyceride mildly elevated. Recommend healthy eating habits and exercise as tolerated. Thyroid level in low normal range.

## 2017-02-26 LAB — DRUG SCREEN 764883 11+OXYCO+ALC+CRT-BUND
Amphetamines, Urine: NEGATIVE ng/mL
BENZODIAZ UR QL: NEGATIVE ng/mL
Barbiturate: NEGATIVE ng/mL
CREATININE: 182 mg/dL (ref 20.0–300.0)
Cocaine (Metabolite): NEGATIVE ng/mL
Ethanol: NEGATIVE %
MEPERIDINE: NEGATIVE ng/mL
METHADONE SCREEN, URINE: NEGATIVE ng/mL
OPIATE SCREEN URINE: NEGATIVE ng/mL
OXYCODONE+OXYMORPHONE UR QL SCN: NEGATIVE ng/mL
PH OF URINE: 5.5 (ref 4.5–8.9)
PHENCYCLIDINE: NEGATIVE ng/mL
PROPOXYPHENE: NEGATIVE ng/mL
Tramadol: NEGATIVE ng/mL

## 2017-02-26 LAB — CANNABINOID CONFIRMATION, UR
CANNABINOIDS: POSITIVE — AB
CARBOXY THC GC/MS CONF: 90 ng/mL

## 2017-03-25 ENCOUNTER — Ambulatory Visit: Payer: Self-pay | Admitting: Internal Medicine

## 2017-03-26 MED FILL — METOPROLOL SUCCINATE ER 25: 25 | 30 days supply | Qty: 30 | Fill #0

## 2017-03-26 MED FILL — ?AMITRIPTYLINE HCL 50MG TAB: 50 | 30 days supply | Qty: 30 | Fill #0

## 2017-03-26 MED FILL — ?HYDROCHLOROTHIAZIDE 12.5MG: 12.5 | 30 days supply | Qty: 30 | Fill #0

## 2017-04-24 ENCOUNTER — Ambulatory Visit: Payer: Self-pay | Admitting: Internal Medicine

## 2019-07-23 ENCOUNTER — Ambulatory Visit: Payer: Self-pay | Attending: Internal Medicine | Admitting: Internal Medicine

## 2019-07-23 ENCOUNTER — Other Ambulatory Visit: Payer: Self-pay

## 2019-07-23 ENCOUNTER — Encounter: Payer: Self-pay | Admitting: Internal Medicine

## 2019-07-23 VITALS — BP 145/88 | HR 62 | Resp 16 | Ht 72.0 in | Wt 261.6 lb

## 2019-07-23 DIAGNOSIS — R079 Chest pain, unspecified: Secondary | ICD-10-CM | POA: Insufficient documentation

## 2019-07-23 DIAGNOSIS — F17200 Nicotine dependence, unspecified, uncomplicated: Secondary | ICD-10-CM

## 2019-07-23 DIAGNOSIS — Z125 Encounter for screening for malignant neoplasm of prostate: Secondary | ICD-10-CM

## 2019-07-23 DIAGNOSIS — R053 Chronic cough: Secondary | ICD-10-CM | POA: Insufficient documentation

## 2019-07-23 DIAGNOSIS — I10 Essential (primary) hypertension: Secondary | ICD-10-CM

## 2019-07-23 DIAGNOSIS — R05 Cough: Secondary | ICD-10-CM

## 2019-07-23 MED ORDER — HYDROCHLOROTHIAZIDE 12.5 MG PO CAPS: 12.5000 mg | ORAL_CAPSULE | Freq: Every day | ORAL | 3 refills | Status: DC

## 2019-07-23 MED ORDER — NICOTINE 21 MG/24HR TD PT24: 21.0000 mg | MEDICATED_PATCH | Freq: Every day | TRANSDERMAL | 0 refills | Status: DC

## 2019-07-23 MED ORDER — METOPROLOL SUCCINATE ER 25 MG PO TB24: 25.0000 mg | ORAL_TABLET | Freq: Every day | ORAL | 3 refills | Status: DC

## 2019-07-23 MED ORDER — AMITRIPTYLINE HCL 50 MG PO TABS: 50.0000 mg | ORAL_TABLET | Freq: Every evening | ORAL | 3 refills | Status: DC | PRN

## 2019-07-23 MED ORDER — ASPIRIN EC 81 MG PO TBEC: 81.0000 mg | DELAYED_RELEASE_TABLET | Freq: Every day | ORAL | 1 refills | Status: AC

## 2019-07-23 MED FILL — METOPROLOL SUCCINATE ER 25: 25 | 30 days supply | Qty: 30 | Fill #0

## 2019-07-23 NOTE — Progress Notes (Signed)
Patient ID: Dustin [name], male    DOB: 1960/07/21  MRN: [id number]  CC: Hypertension   Subjective: Dustin [name] is a 59 y.o. 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  Pt reports he has been taking care of ex-wife and mother after they had surgeries.  HTN:  Out of HCTZ and Metoprolol since mid 2019.  He was able to get some Metoprolol from his cousin and took one every few days.  Took one this a.m  BP this a.m was 162/112 -limits salt in foods -endorses HA and SOB  Reports LT sided CP intermittently x  Few mths. Radiates down arms at times. Can last for hrs.  Can occur at rest or exertion. No fhx of heart disease.   Thinks he had COVID early part of last yr.  Had loss sense of smell and taste. Did not get tested.   Tob dep:  Smoking 1/2 pk a day down from a pack a day.  Would like to quit if he can get some help with quitting.   Complains of cough productive of white x 1 yr.  Progressively worse. No blood in mucous. Use to have acid reflux but not as often in past several mths.  Denies any nasal congestion.  No drainage at the back of the throat.  He would like to have a chest x-ray done to evaluate his cough.  He requests refill on amitriptyline which he takes as needed to help with sleep. Patient Active Problem List   Diagnosis Date Noted  . 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 OSA (obstructive sleep apnea) 05/15/2016  . Neck pain 03/05/2016  . Low back pain 03/05/2016  . DDD (degenerative disc disease), lumbosacral 01/31/2016  . Colon cancer screening 03/02/2015  . Acute meniscal tear of left knee 07/05/2014  . Left knee pain 06/16/2014  . Uncontrolled hypertension 06/16/2014  . Preventative health care 02/17/2014  . Other headache syndrome 02/17/2014  . Chronic pain syndrome 02/17/2014  . Medication overuse headache 05/06/2013  .  Dental caries 04/06/2013  . Headache(784.0) 04/06/2013     No current outpatient medications on file prior to visit.   No current facility-administered medications on file prior to visit.    No Known Allergies  Social History   Socioeconomic History  . Marital status: Legally Separated    Spouse name: Not on file  . Number of children: Not on file  . Years of education: Not on file  . Highest education level: Not on file  Occupational History  . Not on file  Tobacco Use  . Smoking status: Current Every Day Smoker    Packs/day: 0.50    Types: Cigarettes  . Smokeless tobacco: Never Used  Substance and Sexual Activity  . Alcohol use: Yes    Alcohol/week: 0.0 standard drinks    Comment: occasionally- "3 shots a month"  . Drug use: No  . Sexual activity: Not Currently    Partners: Female  Other Topics Concern  . Not on file  Social History Narrative  . Not on file   Social Determinants of Health   Financial Resource Strain:   . Difficulty of Paying Living Expenses:   Food Insecurity:   . Worried About Charity fundraiser in the Last Year:   . [place] in the Last Year:  Transportation Needs:   . Film/video editor (Medical):   Marland Kitchen Lack of Transportation (Non-Medical):   Physical Activity:   . Days of Exercise per Week:   . Minutes of Exercise per Session:   Stress:   . Feeling of Stress :   Social Connections:   . Frequency of Communication with Friends and Family:   . Frequency of Social Gatherings with Friends and Family:   . Attends Religious Services:   . Active Member of Clubs or Organizations:   . Attends Archivist Meetings:   Marland Kitchen Marital Status:   Intimate Partner Violence:   . Fear of Current or Ex-Partner:   . Emotionally Abused:   Marland Kitchen Physically Abused:   . Sexually Abused:     Family History  Problem Relation Age of Onset  . Rheum arthritis Mother   . Cancer Father   . Diabetes Father     Past Surgical History:   Procedure Laterality Date  . ANTERIOR CRUCIATE LIGAMENT REPAIR    . NECK SURGERY      ROS: Review of Systems Negative except as stated above  PHYSICAL EXAM: BP (!) 145/88   Pulse 62   Resp 16   Ht 6' (1.829 m)   Wt 261 lb 9.6 oz (118.7 kg)   SpO2 97%   BMI 35.48 kg/m   Wt Readings from Last 3 Encounters:  07/23/19 261 lb 9.6 oz (118.7 kg)  02/21/17 261 lb 12.8 oz (118.8 kg)  09/18/16 279 lb (126.6 kg)    Physical Exam  General appearance - alert, well appearing, older Caucasian male and in no distress Mental status - normal mood, behavior, speech, dress, motor activity, and thought processes Neck - supple, no significant adenopathy Chest - clear to auscultation, no wheezes, rales or rhonchi, symmetric air entry Heart - normal rate, regular rhythm, normal S1, S2, no murmurs, rubs, clicks or gallops Extremities - peripheral pulses normal, no pedal edema, no clubbing or cyanosis EKG revealed sinus bradycardia at 59 without acute changes.  Unchanged from ECG done in 2014. CMP Latest Ref Rng & Units 02/21/2017 01/31/2016 02/17/2014  Glucose 65 - 99 mg/dL 94 90 91  BUN 6 - 24 mg/dL 15 20 16   Creatinine 0.76 - 1.27 mg/dL 1.08 1.18 1.28  Sodium 134 - 144 mmol/L 141 137 137  Potassium 3.5 - 5.2 mmol/L 5.2 4.9 5.2  Chloride 96 - 106 mmol/L 103 106 101  CO2 20 - 29 mmol/L 22 23 28   Calcium 8.7 - 10.2 mg/dL 9.6 9.6 9.2  Total Protein 6.0 - 8.5 g/dL 7.3 6.7 6.9  Total Bilirubin 0.0 - 1.2 mg/dL 0.3 0.4 0.3  Alkaline Phos 39 - 117 IU/L 65 51 71  AST 0 - 40 IU/L 23 19 23   ALT 0 - 44 IU/L 25 28 28    Lipid Panel     Component Value Date/Time   CHOL 162 02/21/2017 1154   TRIG 239 (H) 02/21/2017 1154   HDL 35 (L) 02/21/2017 1154   CHOLHDL 4.6 02/21/2017 1154   CHOLHDL 4.0 01/31/2016 1047   VLDL 33 (H) 01/31/2016 1047   LDLCALC 79 02/21/2017 1154    CBC    Component Value Date/Time   WBC 8.3 02/21/2017 1154   WBC 10.7 01/31/2016 1047   RBC 5.33 02/21/2017 1154   RBC 5.14  01/31/2016 1047   HGB 16.7 02/21/2017 1154   HCT 50.7 02/21/2017 1154   PLT 293 02/21/2017 1154   MCV 95 02/21/2017 1154  MCH 31.3 02/21/2017 1154   MCH 31.3 01/31/2016 1047   MCHC 32.9 02/21/2017 1154   MCHC 33.6 01/31/2016 1047   RDW 13.8 02/21/2017 1154   LYMPHSABS 2,996 01/31/2016 1047   MONOABS 642 01/31/2016 1047   EOSABS 214 01/31/2016 1047   BASOSABS 107 01/31/2016 1047    ASSESSMENT AND PLAN: 1. Essential hypertension Not at goal.  Restart metoprolol and hydrochlorothiazide.  DASH diet discussed and encouraged.  Follow-up with clinical pharmacist in 2 weeks for blood pressure recheck. - CBC - Comprehensive metabolic panel - Lipid panel - metoprolol succinate (TOPROL-XL) 25 MG 24 hr tablet; Take 1 tablet (25 mg total) by mouth daily.  Dispense: 90 tablet; Refill: 3 - hydrochlorothiazide (MICROZIDE) 12.5 MG capsule; Take 1 capsule (12.5 mg total) by mouth daily.  Dispense: 90 capsule; Refill: 3  2. Tobacco dependence Advised to quit.  Discussed health risks associated with smoking including increased risk of cardiovascular events.  Patient wants to try a trial of quitting.  We discussed methods to help him quit.  He wants to try the nicotine patches.  We will start him with a 21 mg patch.  I went over the stepdown approach with him. - nicotine (NICODERM CQ - DOSED IN MG/24 HOURS) 21 mg/24hr patch; Place 1 patch (21 mg total) onto the skin daily.  Dispense: 28 patch; Refill: 0  3. Chronic cough Encourage patient to apply for the orange card/cone discount.  Once approved we can do lung cancer screening with low-dose CT.  For now we will order chest x-ray - DG Chest 2 View; Future  4. Chest pain in adult History is concerning.  Encourage patient to quit smoking.  Refill metoprolol.  Start low-dose aspirin.  Refer to cardiology - aspirin EC 81 MG tablet; Take 1 tablet (81 mg total) by mouth daily. Swallow whole.  Dispense: 100 tablet; Refill: 1 - EKG 12-Lead - Ambulatory  referral to Cardiology  5. Prostate cancer screening - PSA    Patient was given the opportunity to ask questions.  Patient verbalized understanding of the plan and was able to repeat key elements of the plan.   Orders Placed This Encounter  Procedures  . DG Chest 2 View  . CBC  . Comprehensive metabolic panel  . Lipid panel  . PSA  . Ambulatory referral to Cardiology  . EKG 12-Lead     Requested Prescriptions   Signed Prescriptions Disp Refills  . metoprolol succinate (TOPROL-XL) 25 MG 24 hr tablet 90 tablet 3    Sig: Take 1 tablet (25 mg total) by mouth daily.  . hydrochlorothiazide (MICROZIDE) 12.5 MG capsule 90 capsule 3    Sig: Take 1 capsule (12.5 mg total) by mouth daily.  Marland Kitchen amitriptyline (ELAVIL) 50 MG tablet 90 tablet 3    Sig: Take 1 tablet (50 mg total) by mouth at bedtime as needed. for sleep  . aspirin EC 81 MG tablet 100 tablet 1    Sig: Take 1 tablet (81 mg total) by mouth daily. Swallow whole.  . nicotine (NICODERM CQ - DOSED IN MG/24 HOURS) 21 mg/24hr patch 28 patch 0    Sig: Place 1 patch (21 mg total) onto the skin daily.    Return in about 2 months (around 09/23/2019) for [facility] in 2 wks for repeat BP check.  Karle Plumber, MD, FACP

## 2019-07-24 LAB — COMPREHENSIVE METABOLIC PANEL
ALT: 35 IU/L (ref 0–44)
AST: 26 IU/L (ref 0–40)
Albumin/Globulin Ratio: 1.8 (ref 1.2–2.2)
Albumin: 4.6 g/dL (ref 3.8–4.9)
Alkaline Phosphatase: 72 IU/L (ref 48–121)
BUN/Creatinine Ratio: 9 (ref 9–20)
BUN: 11 mg/dL (ref 6–24)
Bilirubin Total: 0.2 mg/dL (ref 0.0–1.2)
CO2: 25 mmol/L (ref 20–29)
Calcium: 9.6 mg/dL (ref 8.7–10.2)
Chloride: 104 mmol/L (ref 96–106)
Creatinine, Ser: 1.2 mg/dL (ref 0.76–1.27)
GFR calc Af Amer: 78 mL/min/{1.73_m2} (ref >59)
GFR calc non Af Amer: 67 mL/min/{1.73_m2} (ref >59)
Globulin, Total: 2.5 g/dL (ref 1.5–4.5)
Glucose: 87 mg/dL (ref 65–99)
Potassium: 5.1 mmol/L (ref 3.5–5.2)
Sodium: 141 mmol/L (ref 134–144)
Total Protein: 7.1 g/dL (ref 6.0–8.5)

## 2019-07-24 LAB — CBC
Hematocrit: 53.3 % — ABNORMAL HIGH (ref 37.5–51.0)
Hemoglobin: 17.5 g/dL (ref 13.0–17.7)
MCH: 30.5 pg (ref 26.6–33.0)
MCHC: 32.8 g/dL (ref 31.5–35.7)
MCV: 93 fL (ref 79–97)
Platelets: 276 10*3/uL (ref 150–450)
RBC: 5.74 x10E6/uL (ref 4.14–5.80)
RDW: 12.7 % (ref 11.6–15.4)
WBC: 9.1 10*3/uL (ref 3.4–10.8)

## 2019-07-24 LAB — LIPID PANEL
Chol/HDL Ratio: 4.6 ratio (ref 0.0–5.0)
Cholesterol, Total: 172 mg/dL (ref 100–199)
HDL: 37 mg/dL — ABNORMAL LOW (ref >39)
LDL Chol Calc (NIH): 95 mg/dL (ref 0–99)
Triglycerides: 235 mg/dL — ABNORMAL HIGH (ref 0–149)
VLDL Cholesterol Cal: 40 mg/dL (ref 5–40)

## 2019-07-24 LAB — PSA: Prostate Specific Ag, Serum: 0.4 ng/mL (ref 0.0–4.0)

## 2019-07-25 NOTE — Progress Notes (Signed)
Kidney and liver function tests normal.  Blood cell count normal.  PSA level which is the screening test for prostate cancer is normal.  Total and LDL cholesterol are normal.  Triglyceride level elevated at two hundred and thirty-five with normal being less than one hundred and fifty.  Healthy eating habits and regular exercise will help to lower triglyceride level.

## 2019-07-26 ENCOUNTER — Other Ambulatory Visit: Payer: Self-pay

## 2019-07-26 ENCOUNTER — Ambulatory Visit (HOSPITAL_COMMUNITY)
Admission: RE | Admit: 2019-07-26 | Discharge: 2019-07-26 | Disposition: A | Discharge status: Home / Self Care | Payer: Self-pay | Source: Ambulatory Visit | Attending: Internal Medicine | Admitting: Internal Medicine

## 2019-07-26 ENCOUNTER — Telehealth: Payer: Self-pay

## 2019-07-26 DIAGNOSIS — R053 Chronic cough: Secondary | ICD-10-CM

## 2019-07-26 DIAGNOSIS — R05 Cough: Secondary | ICD-10-CM | POA: Insufficient documentation

## 2019-07-26 MED FILL — HYDROCHLOROTHIAZIDE 12.5 MG: 12.5 | 30 days supply | Qty: 30 | Fill #0

## 2019-07-26 MED FILL — NICOTINE 21 MG/24HR PATCH: 21 | 28 days supply | Qty: 28 | Fill #0

## 2019-07-26 MED FILL — AMITRIPTYLINE HCL 50 MG TAB: 50 | 30 days supply | Qty: 30 | Fill #0

## 2019-07-26 NOTE — Telephone Encounter (Signed)
Contacted pt to go over lab results pt was not at home at the moment. Pt mother will have pt call back to get results

## 2019-07-27 ENCOUNTER — Telehealth: Payer: Self-pay

## 2019-07-27 ENCOUNTER — Telehealth: Payer: Self-pay | Admitting: Internal Medicine

## 2019-07-27 NOTE — Telephone Encounter (Signed)
I called the Pt LVM explain him that on the first page of the application is the requirement he need to bring to the appt , if he has any question to call us back

## 2019-07-27 NOTE — Telephone Encounter (Signed)
Contacted pt to go over xray results pt is aware and doesn't have any questions or concerns  

## 2019-08-02 ENCOUNTER — Other Ambulatory Visit: Payer: Self-pay

## 2019-08-02 ENCOUNTER — Ambulatory Visit: Payer: Self-pay | Attending: Internal Medicine

## 2019-08-26 ENCOUNTER — Telehealth: Payer: Self-pay | Admitting: Internal Medicine

## 2019-08-26 MED FILL — HYDROCHLOROTHIAZIDE 12.5 MG: 12.5 | 30 days supply | Qty: 30 | Fill #1

## 2019-08-26 MED FILL — AMITRIPTYLINE HCL 50 MG TAB: 50 | 30 days supply | Qty: 30 | Fill #1

## 2019-08-26 MED FILL — METOPROLOL SUCCINATE ER 25: 25 | 30 days supply | Qty: 30 | Fill #1

## 2019-08-26 NOTE — Telephone Encounter (Signed)
Copied from [place] [id number]. Topic: General - Other >> Aug 26, 2019  3:51 PM Keene Breath wrote: Reason for CRM: Patient would like the doctor to consider increasing the MG of BP medication to 50 MG.  The 25 MG of the Metoprolol is not strong enough anymore.  Please advise and call patient to discuss at [phone]

## 2019-08-26 NOTE — Telephone Encounter (Signed)
Will forward to pcp

## 2019-08-31 NOTE — Telephone Encounter (Signed)
Call placed to patient x3. No answer. Left HIPAA-compliant VM stating who I was and the reason for my call. Left instructions to call us back regarding BP/metoprolol concerns.

## 2019-09-09 NOTE — Progress Notes (Signed)
This encounter was created in error - please disregard.

## 2019-09-10 ENCOUNTER — Encounter: Payer: Self-pay | Admitting: Cardiology

## 2019-09-16 NOTE — Progress Notes (Signed)
Cardiology Office Note    Date:  09/17/2019   ID:  Dustin [name], DOB [birth year], MRN [id number]  PCP:  Ladell Pier, MD  Cardiologist:  Fransico Him, MD   Chief Complaint  Patient presents with  . New Patient (Initial Visit)    Chest pain    History of Present Illness:  Dustin [name] is a 59 y.o. male who is being seen today for the evaluation of Chest pain at the request of Ladell Pier, MD.  This is a 59yo male with a hx of GERD and HTN who is referred by his PCP for evaluation of chest pain.  He has been having intermittent sharp chest pain on the left side for a few months that radiates down his arms as well as numbness and tingling and will not necessarily be both arms each time.  It will last until he sits down and rests.  It occurs with both rest and exertion.  He occasionally will feel nauseated when he gets the chest discomfort.  He also intermittently will feel diaphoretic but usually associated with his BS dropping but has never been dx with DM.  He also has noticed that when he is wearing the mask he feels like he is not getting enough oxygen into his lungs.  He tells me that he has had HTN for quite a while and recently the BP has been running elevated.  He has been a smoker all his life and thinks he has COPD.  There is no fm hx of CAD.   He has chronic LE edema.  He has not had any PND, orthopnea, dizziness, palpitations, syncope.  Past Medical History:  Diagnosis Date  . Arthritis   . DDD (degenerative disc disease)   . GERD (gastroesophageal reflux disease)   . Gout   . HLD (hyperlipidemia)   . Hypertension     Past Surgical History:  Procedure Laterality Date  . ANTERIOR CRUCIATE LIGAMENT REPAIR    . NECK SURGERY      Current Medications: Current Meds  Medication Sig  . amitriptyline (ELAVIL) 50 MG tablet Take 1 tablet (50 mg total) by mouth at bedtime as needed. for sleep  . aspirin EC 81 MG tablet Take 1 tablet (81 mg total) by mouth  daily. Swallow whole.  . hydrochlorothiazide (MICROZIDE) 12.5 MG capsule Take 1 capsule (12.5 mg total) by mouth daily.  . metoprolol succinate (TOPROL-XL) 25 MG 24 hr tablet Take 1 tablet (25 mg total) by mouth daily.  . nicotine (NICODERM CQ - DOSED IN MG/24 HOURS) 21 mg/24hr patch Place 1 patch (21 mg total) onto the skin daily.    Allergies:   Patient has no known allergies.   Social History   Socioeconomic History  . Marital status: Legally Separated    Spouse name: Not on file  . Number of children: Not on file  . Years of education: Not on file  . Highest education level: Not on file  Occupational History  . Not on file  Tobacco Use  . Smoking status: Current Every Day Smoker    Packs/day: 0.50    Types: Cigarettes  . Smokeless tobacco: Never Used  Substance and Sexual Activity  . Alcohol use: Yes    Alcohol/week: 0.0 standard drinks    Comment: occasionally- "3 shots a month"  . Drug use: No  . Sexual activity: Not Currently    Partners: Female  Other Topics Concern  . Not on file  Social  History Narrative  . Not on file   Social Determinants of Health   Financial Resource Strain:   . Difficulty of Paying Living Expenses: Not on file  Food Insecurity:   . Worried About Charity fundraiser in the Last Year: Not on file  . Ran Out of Food in the Last Year: Not on file  Transportation Needs:   . Lack of Transportation (Medical): Not on file  . Lack of Transportation (Non-Medical): Not on file  Physical Activity:   . Days of Exercise per Week: Not on file  . Minutes of Exercise per Session: Not on file  Stress:   . Feeling of Stress : Not on file  Social Connections:   . Frequency of Communication with Friends and Family: Not on file  . Frequency of Social Gatherings with Friends and Family: Not on file  . Attends Religious Services: Not on file  . Active Member of Clubs or Organizations: Not on file  . Attends Archivist Meetings: Not on file  .  Marital Status: Not on file     Family History:  The patient's family history includes Cancer in his father; Diabetes in his father; Peripheral Artery Disease in his mother; Rheum arthritis in his mother.   ROS:   Please see the history of present illness.    ROS All other systems reviewed and are negative.  No flowsheet data found.     PHYSICAL EXAM:   VS:  BP (!) 142/90   Pulse (!) 112   Ht 6' (1.829 m)   Wt 257 lb 6.4 oz (116.8 kg)   SpO2 95%   BMI 34.91 kg/m    GEN: Well nourished, well developed, in no acute distress  HEENT: normal  Neck: no JVD, carotid bruits, or masses Cardiac: RRR; no murmurs, rubs, or gallops,no edema.  Intact distal pulses bilaterally.  Respiratory:  clear to auscultation bilaterally, normal work of breathing GI: soft, nontender, nondistended, + BS MS: no deformity or atrophy  Skin: warm and dry, no rash Neuro:  Alert and Oriented x 3, Strength and sensation are intact Psych: euthymic mood, full affect  Wt Readings from Last 3 Encounters:  09/17/19 257 lb 6.4 oz (116.8 kg)  07/23/19 261 lb 9.6 oz (118.7 kg)  02/21/17 261 lb 12.8 oz (118.8 kg)      Studies/Labs Reviewed:   EKG:  EKG is ordered today.  The ekg ordered today demonstrates sinus tachycardia with no acute ST changes  Recent Labs: 07/23/2019: ALT 35; BUN 11; Creatinine, Ser 1.20; Hemoglobin 17.5; Platelets 276; Potassium 5.1; Sodium 141   Lipid Panel    Component Value Date/Time   CHOL 172 07/23/2019 1657   TRIG 235 (H) 07/23/2019 1657   HDL 37 (L) 07/23/2019 1657   CHOLHDL 4.6 07/23/2019 1657   CHOLHDL 4.0 01/31/2016 1047   VLDL 33 (H) 01/31/2016 1047   [place] 95 07/23/2019 1657    Additional studies/ records that were reviewed today include:  OV notes from PCP    ASSESSMENT:    1. Chest pain of uncertain etiology   2. Essential hypertension   3. Mixed hyperlipidemia   4. DOE (dyspnea on exertion)   5. Tachycardia      PLAN:  In order of problems listed  above:  1.  Chest pain -he has typical and atypical symptoms.  CP can last for hours at a time but no associated sx of nausea, diaphoresis or SOB and can occur with rest or  exertiona.  -his CRFs include HTN and tobacco use -I will get a coronary CTA to assess for CAD  2.  HTN -BP controlled -continue HCTZ 12.5mg  daily and Toprol XL 25mg  daily  3.  HLD -he has a hx of elevated triglycerides at 235 and LDL 95 -will refer to lipid clinic  4.  DOE -this is likely due to long term smoking and undiagnosed COPD -I will get a 2D echo to assess LVF and diastolic function  5.  Tachycardia -he ran out of Toprol yesterday which is likely why he is tachycardic -will refill Toprol XL 25mg  daily -check 3 day ziopatch   Medication Adjustments/Labs and Tests Ordered: Current medicines are reviewed at length with the patient today.  Concerns regarding medicines are outlined above.  Medication changes, Labs and Tests ordered today are listed in the Patient Instructions below.  There are no Patient Instructions on file for this visit.   Signed, Fransico Him, MD  09/17/2019 1:10 PM    [facility], [place], [place]  [address] Phone: [phone]; Fax: [phone]

## 2019-09-17 ENCOUNTER — Ambulatory Visit (INDEPENDENT_AMBULATORY_CARE_PROVIDER_SITE_OTHER): Payer: Self-pay | Admitting: Cardiology

## 2019-09-17 ENCOUNTER — Other Ambulatory Visit: Payer: Self-pay

## 2019-09-17 ENCOUNTER — Encounter: Payer: Self-pay | Admitting: Cardiology

## 2019-09-17 ENCOUNTER — Telehealth: Payer: Self-pay | Admitting: Internal Medicine

## 2019-09-17 ENCOUNTER — Telehealth: Payer: Self-pay | Admitting: Radiology

## 2019-09-17 VITALS — BP 142/90 | HR 112 | Ht 72.0 in | Wt 257.4 lb

## 2019-09-17 DIAGNOSIS — E785 Hyperlipidemia, unspecified: Secondary | ICD-10-CM | POA: Insufficient documentation

## 2019-09-17 DIAGNOSIS — R0600 Dyspnea, unspecified: Secondary | ICD-10-CM

## 2019-09-17 DIAGNOSIS — R000 Tachycardia, unspecified: Secondary | ICD-10-CM

## 2019-09-17 DIAGNOSIS — R072 Precordial pain: Secondary | ICD-10-CM

## 2019-09-17 DIAGNOSIS — E782 Mixed hyperlipidemia: Secondary | ICD-10-CM

## 2019-09-17 DIAGNOSIS — I10 Essential (primary) hypertension: Secondary | ICD-10-CM

## 2019-09-17 DIAGNOSIS — R0609 Other forms of dyspnea: Secondary | ICD-10-CM

## 2019-09-17 DIAGNOSIS — R079 Chest pain, unspecified: Secondary | ICD-10-CM

## 2019-09-17 MED ORDER — METOPROLOL SUCCINATE ER 25 MG PO TB24: 25.0000 mg | ORAL_TABLET | Freq: Every day | ORAL | 3 refills | Status: DC

## 2019-09-17 MED ORDER — METOPROLOL TARTRATE 100 MG PO TABS
ORAL_TABLET | ORAL | 0 refills | Status: DC
Start: 2019-09-17 — End: 2019-11-11

## 2019-09-17 MED FILL — METOPROLOL SUCCINATE ER 25: 25 | 30 days supply | Qty: 30 | Fill #2

## 2019-09-17 MED FILL — METOPROLOL TARTRATE 100 MG: 100 | 1 days supply | Qty: 1 | Fill #0

## 2019-09-17 NOTE — Addendum Note (Signed)
Addended by: Antonieta Iba on: 09/17/2019 01:20 PM   Modules accepted: Orders

## 2019-09-17 NOTE — Telephone Encounter (Signed)
Pt reports BP has been elevated so some days he has to take Metoprolol 25 mg twice a day.  Home BP readings with wrist cuff average 160/105.  Gets HA with high BP.

## 2019-09-17 NOTE — Telephone Encounter (Signed)
Copied from [place] [id number]. Topic: General - Other >> Sep 17, 2019 10:00 AM Yvette Rack wrote: Reason for CRM: Pt stated he is totally out of the Rx for metoprolol succinate (TOPROL-XL) 25 MG 24 hr tablet due to increasing how he takes it to help bring his blood pressure down. Pt requests that his dosage be increased

## 2019-09-17 NOTE — Telephone Encounter (Signed)
Will forward to pcp

## 2019-09-17 NOTE — Patient Instructions (Addendum)
Medication Instructions:  Your physician recommends that you continue on your current medications as directed. Please refer to the Current Medication list given to you today.  *If you need a refill on your cardiac medications before your next appointment, please call your pharmacy*  Testing/Procedures: Your physician has requested that you have an echocardiogram. Echocardiography is a painless test that uses sound waves to create images of your heart. It provides your doctor with information about the size and shape of your heart and how well your heart's chambers and valves are working. This procedure takes approximately one hour. There are no restrictions for this procedure.  Your physician has recommended that you wear an event monitor. Event monitors are medical devices that record the heart's electrical activity. Doctors most often [place] these monitors to diagnose arrhythmias. Arrhythmias are problems with the speed or rhythm of the heartbeat. The monitor is a small, portable device. You can wear one while you do your normal daily activities. This is usually used to diagnose what is causing palpitations/syncope (passing out).  Your physician has recommended that you have a coronary CTA scan. Please see below for further instructions.    Follow-Up: At [facility], you and your health needs are our priority.  As part of our continuing mission to provide you with exceptional heart care, we have created designated Provider Care Teams.  These Care Teams include your primary Cardiologist (physician) and Advanced Practice Providers (APPs -  Physician Assistants and Nurse Practitioners) who all work together to provide you with the care you need, when you need it.  Follow up with Dr. Radford Pax as needed based on results of testing.   Other Instructions Your cardiac CT will be scheduled at one of the below locations:   [facility] [address] [place], [place] [address] [phone]  If scheduled at [facility], please arrive at the [facility] main entrance of [facility] 30 minutes prior to test start time. Proceed to the [facility] Radiology Department (first floor) to check-in and test prep.  Please follow these instructions carefully (unless otherwise directed):  Hold all erectile dysfunction medications at least 3 days (72 hrs) prior to test.  On the Night Before the Test: . Be sure to Drink plenty of water. . Do not consume any caffeinated/decaffeinated beverages or chocolate 12 hours prior to your test. . Do not take any antihistamines 12 hours prior to your test.  On the Day of the Test: . Drink plenty of water. Do not drink any water within one hour of the test. . Do not eat any food 4 hours prior to the test. . You may take your regular medications prior to the test.  . Take metoprolol (Lopressor) two hours prior to test. . HOLD Hydrochlorothiazide morning of the test.      After the Test: . Drink plenty of water. . After receiving IV contrast, you may experience a mild flushed feeling. This is normal. . On occasion, you may experience a mild rash up to 24 hours after the test. This is not dangerous. If this occurs, you can take Benadryl 25 mg and increase your fluid intake. . If you experience trouble breathing, this can be serious. If it is severe call 911 IMMEDIATELY. If it is mild, please call our office. . If you take any of these medications: Glipizide/Metformin, Avandament, Glucavance, please do not take 48 hours after completing test unless otherwise instructed.   Once we have confirmed authorization  from your insurance company, we will call you to set up a date and time for your test. Based on how quickly your insurance processes prior authorizations requests, please allow up to 4 weeks to be contacted for scheduling your Cardiac CT appointment. Be advised that routine Cardiac CT appointments could be scheduled as many as  8 weeks after your provider has ordered it.  For non-scheduling related questions, please contact the cardiac imaging nurse navigator should you have any questions/concerns: Marchia Bond, Cardiac Imaging Nurse Navigator Burley Saver, Interim Cardiac Imaging Nurse [place] [facility] Direct Office Dial: [phone]   For scheduling needs, including cancellations and rescheduling, please call Vivien Rota at [phone], option [phone].     ZIO XT- Long Term Monitor Instructions   Your physician has requested you wear your ZIO patch monitor for 3 days.   This is a single patch monitor.  [employer] supplies one patch monitor per enrollment.  Additional stickers are not available.   Please do not apply patch if you will be having a Nuclear Stress Test, Echocardiogram, Cardiac CT, MRI, or Chest Xray during the time frame you would be wearing the monitor. The patch cannot be worn during these tests.  You cannot remove and re-apply the ZIO XT patch monitor.   Your ZIO patch monitor will be sent USPS Priority mail from [facility] directly to your home address. The monitor may also be mailed to a PO BOX if home delivery is not available.   It may take 3-5 days to receive your monitor after you have been enrolled.   Once you have received you monitor, please review enclosed instructions.  Your monitor has already been registered assigning a specific monitor serial # to you.   Applying the monitor   Shave hair from upper left chest.   Hold abrader disc by orange tab.  Rub abrader in 40 strokes over left upper chest as indicated in your monitor instructions.   Clean area with 4 enclosed alcohol pads .  Use all pads to assure are is cleaned thoroughly.  Let dry.   Apply patch as indicated in monitor instructions.  Patch will be place under collarbone on left side of chest with arrow pointing upward.   Rub patch adhesive wings for 2 minutes.Remove white label marked "1".   Remove white label marked "2".  Rub patch adhesive wings for 2 additional minutes.   While looking in a mirror, press and release button in center of patch.  A small green light will flash 3-4 times .  This will be your only indicator the monitor has been turned on.     Do not shower for the first 24 hours.  You may shower after the first 24 hours.   Press button if you feel a symptom. You will hear a small click.  Record Date, Time and Symptom in the Patient Log Book.   When you are ready to remove patch, follow instructions on last 2 pages of Patient Log Book.  Stick patch monitor onto last page of Patient Log Book.   Place Patient Log Book in [place] box.  Use locking tab on box and tape box closed securely.  The [employer] and [employer] has [employer] on it.  Please place in mailbox as soon as possible.  Your physician should have your test results approximately 7 days after the monitor has been mailed back to [facility].   Call [place] at [phone] if you have  questions regarding your ZIO XT patch monitor.  Call them immediately if you see an orange light blinking on your monitor.   If your monitor falls off in less than 4 days contact our Monitor department at [phone].  If your monitor becomes loose or falls off after 4 days call [employer] at [phone] for suggestions on securing your monitor.

## 2019-09-17 NOTE — Telephone Encounter (Signed)
Enrolled patient for a 3 day Zio XT monitor to be mailed to patient home.

## 2019-09-18 NOTE — Telephone Encounter (Signed)
Addendum from call to pt yesterday:  Pt told me he saw the cardiologist Dr. Radford Pax today (09/17/2019) and his BP and pulse were elevated because he had ran out of the Metoprolol early from having to take an extra one intermittently.  He tells me she refilled the medication but advised that he not increase the dose.  She wants to see what his BP looks like on the 25 mg.  She also order some cardiac testing.  So far now, pt states he will stay on the 25 mg.  I told him that I will have my CMA schedule him to come in for BP check with our clinical pharmacist in a wk or so.  He will keep a log of his home BP and bring readings with him.

## 2019-09-20 NOTE — Telephone Encounter (Signed)
Could you please contact pt and schedule

## 2019-09-20 NOTE — Addendum Note (Signed)
Addended by: Antonieta Iba on: 09/20/2019 09:53 AM   Modules accepted: Orders

## 2019-09-21 NOTE — Telephone Encounter (Signed)
Patient returned call and confirmed the below/

## 2019-09-21 NOTE — Telephone Encounter (Signed)
Pt has been scheduled for 09/27/19 at 3:30 with Lurena Joiner, clinical pharmacist for BP check. Please bring your log of BP readings with you to the appt. Called pt LVM of the appt.

## 2019-09-26 NOTE — Progress Notes (Signed)
   S:    PCP: Dr. Wynetta Emery   Patient arrives in good spirits. Presents to the clinic for hypertension evaluation, counseling, and management.  Patient was referred on 09/17/19 for BP check after seeing his cardiologist who continued his current BP medications.  Patient was last seen by Primary Care Provider on 07/23/19.    Today, patient reports continuing to self adjust metoprolol dosing despite being instructed not to at last cardiology appointment. Reports taking metoprolol succinate 25 mg twice daily on most days when he feels a "blood pressure headache" coming on.   Current BP Medications include:  Hydrochlorothiazide 12.5 mg daily, metoprolol succinate 25 mg daily -Patient reports taking metoprolol succinate twice daily  Dietary habits include: denies adding salt to food, attempting to switch to more low sodium foods, drinks 3-4 cups of coffee per day Exercise habits include: moves around while working, no formal exercise Family / Social history:  -Fhx: diabetes -Tobacco: current every day smoker  O:  Vitals:   09/27/19 1546  BP: (!) 142/90  Pulse: 77   Compared to his home wrist cuff in office which was 138/85 (patient uses wrist cuff appropriately)   Home BP readings: states his average is around 145/100. This morning it was 175/111 before taking medications. Patient is aware of what his goal BP should be.   Last 3 Office BP readings: BP Readings from Last 3 Encounters:  09/27/19 (!) 142/90  09/17/19 (!) 142/90  07/23/19 (!) 145/88    BMET    Component Value Date/Time   NA 141 07/23/2019 1657   K 5.1 07/23/2019 1657   CL 104 07/23/2019 1657   CO2 25 07/23/2019 1657   GLUCOSE 87 07/23/2019 1657   GLUCOSE 90 01/31/2016 1047   BUN 11 07/23/2019 1657   CREATININE 1.20 07/23/2019 1657   CREATININE 1.18 01/31/2016 1047   CALCIUM 9.6 07/23/2019 1657   GFRNONAA 67 07/23/2019 1657   GFRNONAA 70 01/31/2016 1047   GFRAA 78 07/23/2019 1657   GFRAA 81 01/31/2016 1047     Renal function: CrCl cannot be calculated (Patient's most recent lab result is older than the maximum 21 days allowed.).  Clinical ASCVD: No  The 10-year ASCVD risk score Mikey Bussing DC Jr., et al., 2013) is: 17.4%   Values used to calculate the score:     Age: 59 years     Sex: Male     Is Non-Hispanic African American: No     Diabetic: No     Tobacco smoker: Yes     Systolic Blood Pressure: 321 mmHg     Is BP treated: Yes     HDL Cholesterol: 37 mg/dL     Total Cholesterol: 172 mg/dL  A/P: Hypertension currently uncontrolled on current medications. BP is consistent with his last reading at cardiology appointment. BP Goal = < 130/80 mmHg. Medication adherence reported to HCTZ; self-adjusting metoprolol as above.  -Increase hydrochlorothiazide to 25 mg daily -Take metoprolol succinate 25 mg daily. Stop taking twice daily.  -Counseled on lifestyle modifications for blood pressure control including reduced dietary sodium, increased exercise, adequate sleep.  Results reviewed and written information provided. Total time in face-to-face counseling 30 minutes.   F/U pharmacist visit already scheduled 10/04/19. F/u with PCP.   Rebbeca Paul, PharmD PGY1 Pharmacy Resident 09/27/2019 4:32 PM

## 2019-09-27 ENCOUNTER — Other Ambulatory Visit: Payer: Self-pay

## 2019-09-27 ENCOUNTER — Ambulatory Visit: Payer: Self-pay | Attending: Internal Medicine | Admitting: Pharmacist

## 2019-09-27 ENCOUNTER — Other Ambulatory Visit: Payer: Self-pay | Admitting: Internal Medicine

## 2019-09-27 ENCOUNTER — Other Ambulatory Visit (INDEPENDENT_AMBULATORY_CARE_PROVIDER_SITE_OTHER): Payer: Self-pay

## 2019-09-27 ENCOUNTER — Encounter: Payer: Self-pay | Admitting: Pharmacist

## 2019-09-27 VITALS — BP 142/90 | HR 77

## 2019-09-27 DIAGNOSIS — R000 Tachycardia, unspecified: Secondary | ICD-10-CM

## 2019-09-27 DIAGNOSIS — I10 Essential (primary) hypertension: Secondary | ICD-10-CM

## 2019-09-27 MED ORDER — HYDROCHLOROTHIAZIDE 25 MG PO TABS: 25.0000 mg | ORAL_TABLET | Freq: Every day | ORAL | 2 refills | Status: DC

## 2019-09-27 MED FILL — HYDROCHLOROTHIAZIDE 25 MG T: 25 | 30 days supply | Qty: 30 | Fill #0

## 2019-10-03 NOTE — Progress Notes (Signed)
Patient ID: Dustin [name]                 DOB: 10-11-1960                    MRN: [id number]     HPI: Dustin [name] is a 59 y.o. male patient referred to lipid clinic by Dr. Radford Pax. PMH is significant for HLD, HTN and GERD. Patient to undergo cardiac CTA to evaluate for CAD.  Patient arrives for initial visit. He reports that he is in severe dental pain that started after his ECHO this morning and has taken acetaminophen. He has not yet scheduled his cardiac CTA but will do so this afternoon. He has not tried any cholesterol-lowering medications in the past and does not have a family history of cardiac disease. His physical activity is limited by his arthritis, but he is working on maintaining a cardiac healthy diet. He does eat "more fried foods than he should," but he is willing to work on his diet to help lower his cholesterol levels. He is a current smoker but is trying to quit and using nicotine patches.  He does have HTN and his HCTZ dose was recently increased to 25 mg daily at his last visit at [facility]. He continues to take metoprolol succinate 25 mg daily but reports taking an additional a few days each week because he feels the 25 mg is not effectively lowering his BP.  Current Lipid-Lowering Medications: none Intolerances: none Risk Factors: HTN, HLD, ASCVD risk score 17.4%, current smoker (1/2 PPD) LDL goal: <100 mg/dL vs 70 mg/dL pending upcoming cardiac CTA results  Current HTN Medications: metoprolol succinate 25 mg daily, HCTZ 25 mg daily BP goal: <130/80 mmHg  Diet: Does not add salt to food but eats fried foods frequently. Eats eggs, french toast, biscuits for breakfast. Cooks lunch/dinner - uses crockpot often to make beef stew. Eats lots of vegetables (carrots, broccoli, potatoes, salad occasionally), chicken, fish.   Exercise: Limited given his joint pain and degenerative disease - stays active by doing housework and yard work  Family  History: none  Social History: current smoker  Labs: 07/23/19: TC 172, TG 235, HDL 37, LDL 95 (no lipid-lowering medications)  Past Medical History:  Diagnosis Date  . Arthritis   . DDD (degenerative disc disease)   . GERD (gastroesophageal reflux disease)   . Gout   . HLD (hyperlipidemia)   . Hypertension     Current Outpatient Medications on File Prior to Visit  Medication Sig Dispense Refill  . amitriptyline (ELAVIL) 50 MG tablet Take 1 tablet (50 mg total) by mouth at bedtime as needed. for sleep 90 tablet 3  . aspirin EC 81 MG tablet Take 1 tablet (81 mg total) by mouth daily. Swallow whole. 100 tablet 1  . hydrochlorothiazide (HYDRODIURIL) 25 MG tablet Take 1 tablet (25 mg total) by mouth daily. 30 tablet 2  . metoprolol succinate (TOPROL-XL) 25 MG 24 hr tablet Take 1 tablet (25 mg total) by mouth daily. 90 tablet 3  . metoprolol tartrate (LOPRESSOR) 100 MG tablet Take 1 tablet (100 mg) two hours prior to CT scan 1 tablet 0  . nicotine (NICODERM CQ - DOSED IN MG/24 HOURS) 21 mg/24hr patch Place 1 patch (21 mg total) onto the skin daily. 28 patch 0   No current facility-administered medications on file prior to visit.    No Known Allergies  Assessment/Plan:  1. Hyperlipidemia - LDL  is below current goal of <100 mg/dL. However, if cardiac CTA shows that he has CAD, his LDL goal will be <70 mg/dL. Start rosuvastatin 20 mg daily. Encouraged patient to increase his physical activity as able given his pain. He will try to take more walks around the neighborhood. Educated patient on the importance of cardiac healthy diet and he is willing to work on his diet more by decreasing his consumption of fried foods. Will check fasting labs on November 8th.   2. Hypertension - BP elevated and above goal of <130/80 mmHg, HR 74. Increase metoprolol succinate to 50 mg daily since he is occasionally self-increasing dose to 2 tablets daily already. Continue HCTZ 25 mg daily. Consider changing  metoprolol to carvedilol for better BP control. Pending results of ECHO, consider addition of ACE/ARB or amlodipine as well.   Richardine Service, PharmD PGY2 Cardiology Pharmacy Resident

## 2019-10-04 ENCOUNTER — Other Ambulatory Visit: Payer: Self-pay

## 2019-10-04 ENCOUNTER — Ambulatory Visit (INDEPENDENT_AMBULATORY_CARE_PROVIDER_SITE_OTHER): Payer: Self-pay | Admitting: Pharmacist

## 2019-10-04 ENCOUNTER — Other Ambulatory Visit: Payer: Self-pay | Admitting: Cardiology

## 2019-10-04 ENCOUNTER — Ambulatory Visit (HOSPITAL_COMMUNITY): Payer: Self-pay | Attending: Internal Medicine

## 2019-10-04 VITALS — BP 138/90 | HR 74

## 2019-10-04 DIAGNOSIS — R0600 Dyspnea, unspecified: Secondary | ICD-10-CM | POA: Insufficient documentation

## 2019-10-04 DIAGNOSIS — I10 Essential (primary) hypertension: Secondary | ICD-10-CM

## 2019-10-04 DIAGNOSIS — E782 Mixed hyperlipidemia: Secondary | ICD-10-CM

## 2019-10-04 DIAGNOSIS — R0609 Other forms of dyspnea: Secondary | ICD-10-CM

## 2019-10-04 LAB — ECHOCARDIOGRAM COMPLETE
Area-P 1/2: 2.46 cm2
S' Lateral: 3.3 cm

## 2019-10-04 MED ORDER — METOPROLOL SUCCINATE ER 50 MG PO TB24: 50.0000 mg | ORAL_TABLET | Freq: Every day | ORAL | 3 refills | Status: DC

## 2019-10-04 MED ORDER — PERFLUTREN LIPID MICROSPHERE
1.0000 mL | INTRAVENOUS | Status: AC | PRN
  Administered 2019-10-04: 1 mL via INTRAVENOUS
  Administered 2019-10-04: 3 mL via INTRAVENOUS

## 2019-10-04 MED ORDER — ROSUVASTATIN CALCIUM 20 MG PO TABS: 20.0000 mg | ORAL_TABLET | Freq: Every day | ORAL | 11 refills | Status: DC

## 2019-10-04 MED ORDER — ROSUVASTATIN CALCIUM 20 MG PO TABS: 20.0000 mg | ORAL_TABLET | Freq: Every day | ORAL | 3 refills | Status: DC

## 2019-10-04 MED ORDER — METOPROLOL SUCCINATE ER 50 MG PO TB24: 50.0000 mg | ORAL_TABLET | Freq: Every day | ORAL | 11 refills | Status: DC

## 2019-10-04 MED FILL — ROSUVASTATIN CALCIUM 20 MG: 20 | 30 days supply | Qty: 30 | Fill #0

## 2019-10-04 MED FILL — METOPROLOL SUCCINATE ER 50: 50 | 30 days supply | Qty: 30 | Fill #0

## 2019-10-04 NOTE — Patient Instructions (Signed)
It was nice to meet you today   Your LDL is 95 and your goal is < 100. If your cardiac CT scan shows that you have coronary artery disease, your LDL goal is <70.  START Rosuvastatin 20 mg daily  Your blood pressure goal is <130/80 mmHg  INCREASE your Metoprolol Succinate to 50 mg once daily   We will plan to recheck your cholesterol in 6-8 weeks  Please call us at [phone] if you have any questions.

## 2019-10-07 ENCOUNTER — Telehealth: Payer: Self-pay | Admitting: Cardiology

## 2019-10-07 ENCOUNTER — Telehealth: Payer: Self-pay

## 2019-10-07 DIAGNOSIS — R072 Precordial pain: Secondary | ICD-10-CM

## 2019-10-07 NOTE — Telephone Encounter (Signed)
Vivien Rota from [place] states they need to pull the patients report, because they did not include all of the triggers. She states she needs a call back for permission to pull the report. Ref: [id number]

## 2019-10-07 NOTE — Telephone Encounter (Signed)
The patient has been notified of the result and verbalized understanding.  All questions (if any) were answered. Antonieta Iba, RN 10/07/2019 11:03 AM

## 2019-10-07 NOTE — Telephone Encounter (Signed)
-----   Message from Sueanne Margarita, MD sent at 10/05/2019  7:40 PM EDT ----- Normal heart function but echo very poor quality and cannot assess wall motion  or RV function.  Please get a cardiac MRI to assess LV and RV

## 2019-10-07 NOTE — Telephone Encounter (Signed)
Spoke with the [place] from [place] who states that the patients report did not show all of the patient's events in his diary. They will be pulling the report back to amend and sending [place] the final report once complete.

## 2019-10-11 ENCOUNTER — Telehealth: Payer: Self-pay | Admitting: Cardiology

## 2019-10-11 NOTE — Telephone Encounter (Signed)
Patient to call me back regarding appointment scheduling for Cardiac MRI ordered by Dr. Radford Pax.

## 2019-10-12 ENCOUNTER — Encounter: Payer: Self-pay | Admitting: Cardiology

## 2019-10-12 NOTE — Telephone Encounter (Signed)
Spoke with patient regarding appointment for Cardiac MRI scheduled Wednesday 11/03/19 at 11:00 am---arrival time is 10:30 am 1st floor admissions office---Will mail informatin to patient and informed him Marchia Bond, RN (nurse navigator) will be in touch with further instructions regarding his medications.  Patient voiced his understanding.

## 2019-11-02 ENCOUNTER — Telehealth (HOSPITAL_COMMUNITY): Payer: Self-pay | Admitting: *Deleted

## 2019-11-02 NOTE — Telephone Encounter (Signed)
Reaching out to patient to offer assistance regarding upcoming cardiac imaging study; pt verbalizes understanding of appt date/time, parking situation and where to check in, and verified current allergies; name and call back number provided for further questions should they arise  [place] [facility] [phone] office [phone] cell

## 2019-11-03 ENCOUNTER — Ambulatory Visit (HOSPITAL_COMMUNITY): Admission: RE | Admit: 2019-11-03 | Payer: Self-pay | Source: Ambulatory Visit

## 2019-11-04 ENCOUNTER — Telehealth: Payer: Self-pay | Admitting: Cardiology

## 2019-11-04 NOTE — Telephone Encounter (Signed)
Left message for patient to call and discuss rescheduling the Cardiac MRI that patient cancelled on 11/03/19

## 2019-11-08 NOTE — Telephone Encounter (Signed)
Patient states he cannot reschedule his Cardiac MRI at this time---he will call back once his financial situation is better.

## 2019-11-10 MED FILL — ROSUVASTATIN CALCIUM 20 MG: 20 | 30 days supply | Qty: 30 | Fill #1

## 2019-11-10 MED FILL — METOPROLOL SUCCINATE ER 50: 50 | 30 days supply | Qty: 30 | Fill #1

## 2019-11-10 MED FILL — HYDROCHLOROTHIAZIDE 25 MG T: 25 | 30 days supply | Qty: 30 | Fill #1

## 2019-11-11 ENCOUNTER — Encounter: Payer: Self-pay | Admitting: Internal Medicine

## 2019-11-11 ENCOUNTER — Other Ambulatory Visit: Payer: Self-pay | Admitting: Internal Medicine

## 2019-11-11 ENCOUNTER — Other Ambulatory Visit: Payer: Self-pay

## 2019-11-11 ENCOUNTER — Ambulatory Visit: Payer: Self-pay | Attending: Internal Medicine | Admitting: Internal Medicine

## 2019-11-11 VITALS — BP 138/78 | HR 71

## 2019-11-11 DIAGNOSIS — Z2821 Immunization not carried out because of patient refusal: Secondary | ICD-10-CM

## 2019-11-11 DIAGNOSIS — M130 Polyarthritis, unspecified: Secondary | ICD-10-CM

## 2019-11-11 DIAGNOSIS — I10 Essential (primary) hypertension: Secondary | ICD-10-CM

## 2019-11-11 DIAGNOSIS — Z1211 Encounter for screening for malignant neoplasm of colon: Secondary | ICD-10-CM

## 2019-11-11 DIAGNOSIS — Z7189 Other specified counseling: Secondary | ICD-10-CM

## 2019-11-11 DIAGNOSIS — F17200 Nicotine dependence, unspecified, uncomplicated: Secondary | ICD-10-CM

## 2019-11-11 MED ORDER — MELOXICAM 15 MG PO TABS: 15.0000 mg | ORAL_TABLET | Freq: Every day | ORAL | 4 refills | Status: DC

## 2019-11-11 MED FILL — MELOXICAM 15 MG TABLET: 15 | 30 days supply | Qty: 30 | Fill #0

## 2019-11-11 NOTE — Progress Notes (Signed)
Feels like the new medication have helped his HR and cholesterol.  Denies having any needs this morning  Took VS at home with BP machine  Pain concerns - arthritic -6/10 - would like to be referred to pain       management

## 2019-11-11 NOTE — Progress Notes (Signed)
Virtual Visit via Telephone Note Due to current restrictions/limitations of in-office visits due to the COVID-19 pandemic, this scheduled clinical appointment was converted to a telehealth visit  I connected with Dustin [name] on 11/11/19 at  8:30 AM EDT by telephone and verified that I am speaking with the correct person using two identifiers.  Location: Patient: home Provider: office   I discussed the limitations, risks, security and privacy concerns of performing an evaluation and management service by telephone and the availability of in person appointments. I also discussed with the patient that there may be a patient responsible charge related to this service. The patient expressed understanding and agreed to proceed.   History of Present Illness: Pt with hx of HTN, OA, DD LS spine, false positive HIV testing2018, tob dep   CP/HTN:  Saw Dr. Radford Pax since last visit.  Had holter which was good.  Echo -EF revealed 55-60% with mild septal hypokinesis.  Coronary CT ordered but he did not have it done as yet due to up front cost of $1600.  Waiting for additional infor from IRS to give to our financial person so he can get approved for [facility] discount. -thinking about reapply for disability -Metoprolol increased to 50 mg.  Compliant with this and HCTZ.  Home BP average 140/80.  BP this a.m was 138/78, P 71.  Tob dep: wears nicotine patch daily. Initially he was still smoking 1/2 pk a day but not down to 3 cig/day.  Finds the patches to be helpful.    Feels his jts are getting worse.  Getting difficult to even walk and "do simple chores with out it tearing me down for 2-3 days."  Jts involved include  Knees, hips, elbows, shoulders and lower back,  Uses Ibuprofen and Tylenol, Omega XL But nothing seems to help.  Use to be on Celebrex in the past but irritates "my intestinal system."  Did better with Meloxicam.  Mother has RA and is on a biologic.  Once he has [facility] discount, he would like  to be referred to the pain clinic to see if they can prescribed something stronger than   HM: never had flu vaccine and does not want to start getting them now. Reluctant to get COVID vaccine because his mother had reactivation of GBS. Due for colon CA screening but uninsured.  Paternal uncle had colon CA.    Outpatient Encounter Medications as of 11/11/2019  Medication Sig  . amitriptyline (ELAVIL) 50 MG tablet Take 1 tablet (50 mg total) by mouth at bedtime as needed. for sleep  . aspirin EC 81 MG tablet Take 1 tablet (81 mg total) by mouth daily. Swallow whole.  . hydrochlorothiazide (HYDRODIURIL) 25 MG tablet Take 1 tablet (25 mg total) by mouth daily.  . metoprolol succinate (TOPROL-XL) 50 MG 24 hr tablet Take 1 tablet (50 mg total) by mouth daily.  . nicotine (NICODERM CQ - DOSED IN MG/24 HOURS) 21 mg/24hr patch Place 1 patch (21 mg total) onto the skin daily.  . rosuvastatin (CRESTOR) 20 MG tablet Take 1 tablet (20 mg total) by mouth daily.  . metoprolol tartrate (LOPRESSOR) 100 MG tablet Take 1 tablet (100 mg) two hours prior to CT scan (Patient not taking: Reported on 11/11/2019)   No facility-administered encounter medications on file as of 11/11/2019.      Observations/Objective: Lab Results  Component Value Date   WBC 9.1 07/23/2019   HGB 17.5 07/23/2019   HCT 53.3 (H) 07/23/2019   MCV 93  07/23/2019   PLT 276 07/23/2019     Chemistry      Component Value Date/Time   NA 141 07/23/2019 1657   K 5.1 07/23/2019 1657   CL 104 07/23/2019 1657   CO2 25 07/23/2019 1657   BUN 11 07/23/2019 1657   CREATININE 1.20 07/23/2019 1657   CREATININE 1.18 01/31/2016 1047      Component Value Date/Time   CALCIUM 9.6 07/23/2019 1657   ALKPHOS 72 07/23/2019 1657   AST 26 07/23/2019 1657   ALT 35 07/23/2019 1657   BILITOT 0.2 07/23/2019 1657       Assessment and Plan: 1. Essential hypertension Reported blood pressure readings are better but not at goal.  Advised that our goal is  to get him 130/80 or lower. He wants to continue the current medications for now as the blood pressure has improved.  2. Polyarthritis Went over with him the difference between degenerative or osteoarthritis versus rheumatoid arthritis.  Given that he has a first-degree relative with RA, we will go ahead and do rheumatoid factor and anti-CCP P levels.  Start meloxicam. - Rheumatoid factor; Future - CYCLIC CITRUL PEPTIDE ANTIBODY, IGG/IGA; Future - meloxicam (MOBIC) 15 MG tablet; Take 1 tablet (15 mg total) by mouth daily.  Dispense: 30 tablet; Refill: 4 - CYCLIC CITRUL PEPTIDE ANTIBODY, IGG/IGA - Rheumatoid factor  3. Tobacco dependence Commended him on cutting back.  He will continue using the nicotine patches.  He is actively trying to quit.  Encouraged him to set a quit date.  Less than 5 minutes spent on counseling.  4. Influenza vaccination declined This was recommended.  Patient declined.  5. Screening for colon cancer Went over colon cancer screening methods.  Since he is currently not insured, he is agreeable to doing the fit test. - Fecal occult blood, imunochemical([facility])  6. Educated about COVID-19 virus infection We discussed and talked about the COVID-19 vaccines.  They are relatively safe and do offer protection against getting the COVID-19 infection.  Patient states he will consider getting the vaccine.   Follow Up Instructions: 4 mths   I discussed the assessment and treatment plan with the patient. The patient was provided an opportunity to ask questions and all were answered. The patient agreed with the plan and demonstrated an understanding of the instructions.   The patient was advised to call back or seek an in-person evaluation if the symptoms worsen or if the condition fails to improve as anticipated.  I provided 28 minutes of non-face-to-face time during this encounter.   Karle Plumber, MD

## 2019-11-13 LAB — CYCLIC CITRUL PEPTIDE ANTIBODY, IGG/IGA: Cyclic Citrullin Peptide Ab: 7 units (ref 0–19)

## 2019-11-13 LAB — RHEUMATOID FACTOR: Rheumatoid fact SerPl-aCnc: <10 IU/mL (ref 0.0–13.9)

## 2019-11-15 ENCOUNTER — Other Ambulatory Visit: Payer: Self-pay

## 2019-11-15 NOTE — Progress Notes (Signed)
Normal result letter generated and mailed to address on file.

## 2019-11-17 ENCOUNTER — Telehealth: Payer: Self-pay

## 2019-11-17 LAB — FECAL OCCULT BLOOD, IMMUNOCHEMICAL: Fecal Occult Bld: NEGATIVE

## 2019-11-17 NOTE — Telephone Encounter (Signed)
Contacted pt to go over lab results lvm  

## 2019-12-20 MED FILL — ROSUVASTATIN CALCIUM 20 MG: 20 | 30 days supply | Qty: 30 | Fill #2

## 2019-12-20 MED FILL — HYDROCHLOROTHIAZIDE 25 MG T: 25 | 30 days supply | Qty: 30 | Fill #2

## 2019-12-20 MED FILL — AMITRIPTYLINE HCL 50 MG TAB: 50 | 30 days supply | Qty: 30 | Fill #2

## 2019-12-20 MED FILL — METOPROLOL SUCCINATE ER 50: 50 | 30 days supply | Qty: 30 | Fill #2

## 2019-12-27 ENCOUNTER — Telehealth: Payer: Self-pay | Admitting: Internal Medicine

## 2019-12-27 NOTE — Telephone Encounter (Signed)
Copied from [place] [id number]. Topic: Quick Communication - See Telephone Encounter >> Dec 27, 2019 11:05 AM Loma [place] wrote: CRM for notification. See Telephone encounter for: 12/27/19.Pt thinks has a extreme infection is his teeth  states does not have but 12 left, and thinks they are all infected, states almost unbearable wants a powerful antibodic and Tylenol 3 as before, knows that no dentist will touch him till he calms down infection,  cb [phone] In such pain states emergency to him and to call back either way, nurse or dr   Please advise. If patient needs appointment I can give him mobile bus location.

## 2019-12-27 NOTE — Telephone Encounter (Signed)
Will forward to pcp

## 2019-12-28 ENCOUNTER — Other Ambulatory Visit: Payer: Self-pay | Admitting: Physician Assistant

## 2019-12-28 ENCOUNTER — Telehealth: Payer: Self-pay | Admitting: Physician Assistant

## 2019-12-28 VITALS — Ht 74.0 in | Wt 265.0 lb

## 2019-12-28 DIAGNOSIS — K029 Dental caries, unspecified: Secondary | ICD-10-CM

## 2019-12-28 MED ORDER — PENICILLIN V POTASSIUM 500 MG PO TABS: 500.0000 mg | ORAL_TABLET | Freq: Four times a day (QID) | ORAL | 0 refills | Status: DC

## 2019-12-28 MED ORDER — ACETAMINOPHEN-CODEINE #3 300-30 MG PO TABS: 1.0000 | ORAL_TABLET | ORAL | 0 refills | Status: DC | PRN

## 2019-12-28 MED FILL — ACETAMINOPHEN-COD #3 TABLET: 300-30 | 3 days supply | Qty: 20 | Fill #0

## 2019-12-28 MED FILL — PENICILLIN VK 500 MG TABLET: 500 | 10 days supply | Qty: 40 | Fill #0

## 2019-12-28 NOTE — Progress Notes (Signed)
Established Patient Office Visit  Subjective:  Patient ID: Dustin [name], male    DOB: [birth year]  Age: 59 y.o. MRN: [id number]  CC:  Chief Complaint  Patient presents with  . Medication Refill    Requesting Tylenol #3 refill   . Dental Pain    Requesting antibiotic, has chronic issues w/ teeth    Virtual Visit via Telephone Note  I connected with Alonna Minium on 12/28/19 at  1:00 PM EST by telephone and verified that I am speaking with the correct person using two identifiers.  Location: Patient: Home Provider: [facility] Medicine Unit    I discussed the limitations, risks, security and privacy concerns of performing an evaluation and management service by telephone and the availability of in person appointments. I also discussed with the patient that there may be a patient responsible charge related to this service. The patient expressed understanding and agreed to proceed.   History of Present Illness:  Dustin [name] reports that he suffers from chronic issues with his teeth, reports he has 12 left, and he knows that they are infected and will need to be extracted.  Unfortunately he has been unable to get in to the free dental clinic due to the coronavirus pandemic.  Reports that he has been using ibuprofen with a small amount of relief, but is unable to take large doses due to it having an adverse effect of raising his blood pressure.  Denies fever or tachycardia.  Does endorse foul taste in mouth.  Reports pain is keeping him from eating and sometimes being able to speak.    Observations/Objective: Medical history and current medications reviewed, no physical exam completed    Past Medical History:  Diagnosis Date  . Arthritis   . DDD (degenerative disc disease)   . GERD (gastroesophageal reflux disease)   . Gout   . HLD (hyperlipidemia)   . Hypertension     Past Surgical History:  Procedure Laterality Date  . ANTERIOR CRUCIATE LIGAMENT REPAIR     . NECK SURGERY      Family History  Problem Relation Age of Onset  . Rheum arthritis Mother   . Peripheral Artery Disease Mother   . Cancer Father   . Diabetes Father     Social History   Socioeconomic History  . Marital status: Legally Separated    Spouse name: Not on file  . Number of children: Not on file  . Years of education: Not on file  . Highest education level: Not on file  Occupational History  . Not on file  Tobacco Use  . Smoking status: Current Every Day Smoker    Packs/day: 0.50    Types: Cigarettes  . Smokeless tobacco: Never Used  Substance and Sexual Activity  . Alcohol use: Yes    Alcohol/week: 0.0 standard drinks    Comment: occasionally- "3 shots a month"  . Drug use: No  . Sexual activity: Not Currently    Partners: Female  Other Topics Concern  . Not on file  Social History Narrative  . Not on file   Social Determinants of Health   Financial Resource Strain: Not on file  Food Insecurity: Not on file  Transportation Needs: Not on file  Physical Activity: Not on file  Stress: Not on file  Social Connections: Not on file  Intimate Partner Violence: Not on file    Outpatient Medications Prior to Visit  Medication Sig Dispense Refill  . amitriptyline (ELAVIL) 50  MG tablet Take 1 tablet (50 mg total) by mouth at bedtime as needed. for sleep 90 tablet 3  . aspirin EC 81 MG tablet Take 1 tablet (81 mg total) by mouth daily. Swallow whole. 100 tablet 1  . hydrochlorothiazide (HYDRODIURIL) 25 MG tablet Take 1 tablet (25 mg total) by mouth daily. 30 tablet 2  . meloxicam (MOBIC) 15 MG tablet Take 1 tablet (15 mg total) by mouth daily. 30 tablet 4  . metoprolol succinate (TOPROL-XL) 50 MG 24 hr tablet Take 1 tablet (50 mg total) by mouth daily. 90 tablet 3  . nicotine (NICODERM CQ - DOSED IN MG/24 HOURS) 21 mg/24hr patch Place 1 patch (21 mg total) onto the skin daily. 28 patch 0  . rosuvastatin (CRESTOR) 20 MG tablet Take 1 tablet (20 mg total) by  mouth daily. 90 tablet 3   No facility-administered medications prior to visit.    No Known Allergies  ROS Review of Systems  Constitutional: Negative for chills and fever.  HENT: Positive for dental problem.   Eyes: Negative.   Respiratory: Negative.   Cardiovascular: Negative.   Gastrointestinal: Negative.   Endocrine: Negative.   Genitourinary: Negative.   Musculoskeletal: Negative.   Skin: Negative.   Allergic/Immunologic: Negative.   Neurological: Negative.   Hematological: Negative.   Psychiatric/Behavioral: Negative.       Objective:     Ht 6\' 2"  (1.88 m) Comment: pt reported  Wt 265 lb (120.2 kg) Comment: pt reported  BMI 34.02 kg/m  Wt Readings from Last 3 Encounters:  12/28/19 265 lb (120.2 kg)  09/17/19 257 lb 6.4 oz (116.8 kg)  07/23/19 261 lb 9.6 oz (118.7 kg)     Health Maintenance Due  Topic Date Due  . COVID-19 Vaccine (1) Never done  . COLONOSCOPY  09/19/2019    There are no preventive care reminders to display for this patient.  Lab Results  Component Value Date   TSH 0.955 02/21/2017   Lab Results  Component Value Date   WBC 9.1 07/23/2019   HGB 17.5 07/23/2019   HCT 53.3 (H) 07/23/2019   MCV 93 07/23/2019   PLT 276 07/23/2019   Lab Results  Component Value Date   NA 141 07/23/2019   K 5.1 07/23/2019   CO2 25 07/23/2019   GLUCOSE 87 07/23/2019   BUN 11 07/23/2019   CREATININE 1.20 07/23/2019   BILITOT 0.2 07/23/2019   ALKPHOS 72 07/23/2019   AST 26 07/23/2019   ALT 35 07/23/2019   PROT 7.1 07/23/2019   ALBUMIN 4.6 07/23/2019   CALCIUM 9.6 07/23/2019   Lab Results  Component Value Date   CHOL 172 07/23/2019   Lab Results  Component Value Date   HDL 37 (L) 07/23/2019   Lab Results  Component Value Date   LDLCALC 95 07/23/2019   Lab Results  Component Value Date   TRIG 235 (H) 07/23/2019   Lab Results  Component Value Date   CHOLHDL 4.6 07/23/2019   Lab Results  Component Value Date   HGBA1C 5.8  06/19/2016      Assessment & Plan:   Problem List Items Addressed This Visit   None    Assessment and Plan:  1. Dental caries Review of [place] controlled substance registry within normal limits, patient has previously been treated successfully with penicillin and Tylenol 3.  Patient encouraged to seek dental care soon as possible, patient understands and agrees.  Red flag warnings given for prompt reevaluation.  No AVS was  created, patient declines my chart - acetaminophen-codeine (TYLENOL #3) 300-30 MG tablet; Take 1 tablet by mouth every 4 (four) hours as needed for moderate pain.  Dispense: 20 tablet; Refill: 0 - penicillin v potassium (VEETID) 500 MG tablet; Take 1 tablet (500 mg total) by mouth 4 (four) times daily for 10 days.  Dispense: 40 tablet; Refill: 0  Follow Up Instructions:    I discussed the assessment and treatment plan with the patient. The patient was provided an opportunity to ask questions and all were answered. The patient agreed with the plan and demonstrated an understanding of the instructions.   The patient was advised to call back or seek an in-person evaluation if the symptoms worsen or if the condition fails to improve as anticipated.  I provided 21 minutes of non-face-to-face time during this encounter.   Purity Irmen S Mayers, PA-C    No orders of the defined types were placed in this encounter.   Follow-up: No follow-ups on file.    Loraine Grip Mayers, PA-C

## 2019-12-28 NOTE — Progress Notes (Signed)
Reports tooth pain and possible infection from chronic issues w/ teeth, reports previously had extractions at free dental clinic but cannot get into one now due to [place], requesting antibiotic and Tylenol #3, having trouble eating/talking

## 2019-12-28 NOTE — Telephone Encounter (Signed)
Will forward to [facility]

## 2019-12-28 NOTE — Telephone Encounter (Signed)
FYI pt has a tele visit with the mobile scheduled for today

## 2020-01-21 ENCOUNTER — Other Ambulatory Visit: Payer: Self-pay | Admitting: Internal Medicine

## 2020-01-21 DIAGNOSIS — I10 Essential (primary) hypertension: Secondary | ICD-10-CM

## 2020-01-21 DIAGNOSIS — E782 Mixed hyperlipidemia: Secondary | ICD-10-CM

## 2020-01-21 MED ORDER — HYDROCHLOROTHIAZIDE 25 MG PO TABS: 25.0000 mg | ORAL_TABLET | Freq: Every day | ORAL | 2 refills | Status: DC

## 2020-01-21 MED ORDER — ROSUVASTATIN CALCIUM 20 MG PO TABS: 20.0000 mg | ORAL_TABLET | Freq: Every day | ORAL | 2 refills | Status: DC

## 2020-01-21 MED ORDER — METOPROLOL SUCCINATE ER 50 MG PO TB24: 50.0000 mg | ORAL_TABLET | Freq: Every day | ORAL | 2 refills | Status: DC

## 2020-01-21 MED FILL — ROSUVASTATIN CALCIUM 20 MG: 20 | 30 days supply | Qty: 30 | Fill #3

## 2020-01-21 MED FILL — METOPROLOL SUCCINATE ER 50: 50 | 30 days supply | Qty: 30 | Fill #3

## 2020-01-21 MED FILL — HYDROCHLOROTHIAZIDE 25 MG T: 25 | 30 days supply | Qty: 30 | Fill #0

## 2020-01-21 NOTE — Telephone Encounter (Signed)
Notes to clinic:  medication filled by a different provider Review for refills   Requested Prescriptions  Pending Prescriptions Disp Refills   metoprolol succinate (TOPROL-XL) 50 MG 24 hr tablet 90 tablet 3    Sig: Take 1 tablet (50 mg total) by mouth daily.      Cardiovascular:  Beta Blockers Passed - 01/21/2020 12:30 PM      Passed - Last BP in normal range    BP Readings from Last 1 Encounters:  11/11/19 138/78          Passed - Last Heart Rate in normal range    Pulse Readings from Last 1 Encounters:  11/11/19 71          Passed - Valid encounter within last 6 months    Recent Outpatient Visits           2 months ago Essential hypertension   [place], Deborah B, MD   3 months ago Essential hypertension   [place], Jarome Matin, RPH-CPP   6 months ago Essential hypertension   [place] Ladell Pier, MD   2 years ago Essential hypertension   [place], Deborah B, MD   3 years ago Chronic pain syndrome   [place] Tresa Garter, MD       Future Appointments             In 1 month Ladell Pier, MD [place]               rosuvastatin (CRESTOR) 20 MG tablet 90 tablet 3    Sig: Take 1 tablet (20 mg total) by mouth daily.      Cardiovascular:  Antilipid - Statins Failed - 01/21/2020 12:30 PM      Failed - LDL in normal range and within 360 days    LDL Chol Calc (NIH)  Date Value Ref Range Status  07/23/2019 95 0 - 99 mg/dL Final          Failed - HDL in normal range and within 360 days    HDL  Date Value Ref Range Status  07/23/2019 37 (L) >39 mg/dL Final          Failed - Triglycerides in normal range and within 360 days    Triglycerides  Date Value Ref Range Status  07/23/2019 235 (H) 0 - 149 mg/dL Final           Passed - Total Cholesterol in normal range and within 360 days    Cholesterol, Total  Date Value Ref Range Status  07/23/2019 172 100 - 199 mg/dL Final          Passed - Patient is not pregnant      Passed - Valid encounter within last 12 months    Recent Outpatient Visits           2 months ago Essential hypertension   [place], Deborah B, MD   3 months ago Essential hypertension   [place], Jarome Matin, RPH-CPP   6 months ago Essential hypertension   [place] Ladell Pier, MD   2 years ago Essential hypertension   [place], Deborah B, MD   3 years ago  Chronic pain syndrome   [place] Tresa Garter, MD       Future Appointments             In 1 month Ladell Pier, MD [place]              Signed Prescriptions Disp Refills   hydrochlorothiazide (HYDRODIURIL) 25 MG tablet 30 tablet 2    Sig: Take 1 tablet (25 mg total) by mouth daily.      Cardiovascular: Diuretics - Thiazide Passed - 01/21/2020 12:30 PM      Passed - Ca in normal range and within 360 days    Calcium  Date Value Ref Range Status  07/23/2019 9.6 8.7 - 10.2 mg/dL Final          Passed - Cr in normal range and within 360 days    Creatinine  Date Value Ref Range Status  02/21/2017 182.0 20.0 - 300.0 mg/dL Final   Creat  Date Value Ref Range Status  01/31/2016 1.18 0.70 - 1.33 mg/dL Final    Comment:      For patients > or = 58 years of age: The upper reference limit for Creatinine is approximately 13% higher for people identified as African-American.      Creatinine, Ser  Date Value Ref Range Status  07/23/2019 1.20 0.76 - 1.27 mg/dL Final          Passed - K in normal range and within 360 days    Potassium  Date Value Ref Range Status   07/23/2019 5.1 3.5 - 5.2 mmol/L Final          Passed - Na in normal range and within 360 days    Sodium  Date Value Ref Range Status  07/23/2019 141 134 - 144 mmol/L Final          Passed - Last BP in normal range    BP Readings from Last 1 Encounters:  11/11/19 138/78          Passed - Valid encounter within last 6 months    Recent Outpatient Visits           2 months ago Essential hypertension   [place], Deborah B, MD   3 months ago Essential hypertension   [place], Jarome Matin, RPH-CPP   6 months ago Essential hypertension   [place], Deborah B, MD   2 years ago Essential hypertension   [place], Deborah B, MD   3 years ago Chronic pain syndrome   [place], [place], MD       Future Appointments             In 1 month Wynetta Emery, Dalbert Batman, MD [place]

## 2020-01-21 NOTE — Telephone Encounter (Signed)
Medication: metoprolol succinate (TOPROL-XL) 50 MG 24 hr tablet [[id number]] , rosuvastatin (CRESTOR) 20 MG tablet [[id number]]  ENDED, hydrochlorothiazide (HYDRODIURIL) 25 MG tablet [[id number]]   Has the patient contacted their pharmacy? YES  (Agent: If no, request that the patient contact the pharmacy for the refill.) (Agent: If yes, when and what did the pharmacy advise?)  Preferred Pharmacy (with phone number or street name): [facility] [address]  [place], [place] [address]  Phone:  [phone] Fax:  [phone]   Agent: Please be advised that RX refills may take up to 3 business days. We ask that you follow-up with your pharmacy.

## 2020-02-16 ENCOUNTER — Encounter: Payer: Self-pay | Admitting: Gastroenterology

## 2020-02-22 MED FILL — METOPROLOL SUCCINATE ER 50: 50 | 30 days supply | Qty: 30 | Fill #4

## 2020-03-10 ENCOUNTER — Encounter: Payer: Self-pay | Admitting: Internal Medicine

## 2020-03-10 ENCOUNTER — Other Ambulatory Visit: Payer: Self-pay | Admitting: Internal Medicine

## 2020-03-10 ENCOUNTER — Other Ambulatory Visit: Payer: Self-pay

## 2020-03-10 ENCOUNTER — Ambulatory Visit: Payer: Self-pay | Attending: Internal Medicine | Admitting: Internal Medicine

## 2020-03-10 VITALS — BP 123/85 | HR 95 | Resp 16 | Wt 259.2 lb

## 2020-03-10 DIAGNOSIS — E66811 Obesity, class 1: Secondary | ICD-10-CM | POA: Insufficient documentation

## 2020-03-10 DIAGNOSIS — E782 Mixed hyperlipidemia: Secondary | ICD-10-CM

## 2020-03-10 DIAGNOSIS — F17200 Nicotine dependence, unspecified, uncomplicated: Secondary | ICD-10-CM

## 2020-03-10 DIAGNOSIS — I10 Essential (primary) hypertension: Secondary | ICD-10-CM

## 2020-03-10 DIAGNOSIS — K029 Dental caries, unspecified: Secondary | ICD-10-CM

## 2020-03-10 DIAGNOSIS — M130 Polyarthritis, unspecified: Secondary | ICD-10-CM

## 2020-03-10 DIAGNOSIS — E669 Obesity, unspecified: Secondary | ICD-10-CM

## 2020-03-10 MED ORDER — ROSUVASTATIN CALCIUM 20 MG PO TABS: 20.0000 mg | ORAL_TABLET | Freq: Every day | ORAL | 2 refills | Status: DC

## 2020-03-10 MED ORDER — MELOXICAM 15 MG PO TABS: 15.0000 mg | ORAL_TABLET | Freq: Every day | ORAL | 4 refills | Status: DC

## 2020-03-10 MED ORDER — AMOXICILLIN 500 MG PO CAPS: 500.0000 mg | ORAL_CAPSULE | Freq: Three times a day (TID) | ORAL | 0 refills | Status: DC

## 2020-03-10 MED ORDER — METOPROLOL SUCCINATE ER 50 MG PO TB24: 50.0000 mg | ORAL_TABLET | Freq: Every day | ORAL | 2 refills | Status: DC

## 2020-03-10 MED FILL — MELOXICAM 15 MG TABLET: 15 | 30 days supply | Qty: 30 | Fill #0

## 2020-03-10 MED FILL — ROSUVASTATIN CALCIUM 20 MG: 20 | 30 days supply | Qty: 30 | Fill #0

## 2020-03-10 MED FILL — AMOXICILLIN 500 MG CAPSULE: 500 | 7 days supply | Qty: 21 | Fill #0

## 2020-03-10 NOTE — Patient Instructions (Signed)
Please call [phone] quit now to get an request free patches to help with smoking cessation.   Healthy Eating Following a healthy eating pattern may help you to achieve and maintain a healthy body weight, reduce the risk of chronic disease, and live a long and productive life. It is important to follow a healthy eating pattern at an appropriate calorie level for your body. Your nutritional needs should be met primarily through food by choosing a variety of nutrient-rich foods. What are tips for following this plan? Reading food labels  Read labels and choose the following: ? Reduced or low sodium. ? Juices with 100% fruit juice. ? Foods with low saturated fats and high polyunsaturated and monounsaturated fats. ? Foods with whole grains, such as whole wheat, cracked wheat, brown rice, and wild rice. ? Whole grains that are fortified with folic acid. This is recommended for women who are pregnant or who want to become pregnant.  Read labels and avoid the following: ? Foods with a lot of added sugars. These include foods that contain brown sugar, corn sweetener, corn syrup, dextrose, fructose, glucose, high-fructose corn syrup, honey, invert sugar, lactose, malt syrup, maltose, molasses, raw sugar, sucrose, trehalose, or turbinado sugar.  Do not eat more than the following amounts of added sugar per day:  6 teaspoons (25 g) for women.  9 teaspoons (38 g) for men. ? Foods that contain processed or refined starches and grains. ? Refined grain products, such as white flour, degermed cornmeal, white bread, and white rice. Shopping  Choose nutrient-rich snacks, such as vegetables, whole fruits, and nuts. Avoid high-calorie and high-sugar snacks, such as potato chips, fruit snacks, and candy.  Use oil-based dressings and spreads on foods instead of solid fats such as butter, stick margarine, or cream cheese.  Limit pre-made sauces, mixes, and "instant" products such as flavored rice, instant  noodles, and ready-made pasta.  Try more plant-protein sources, such as tofu, tempeh, black beans, edamame, lentils, nuts, and seeds.  Explore eating plans such as the Mediterranean diet or vegetarian diet. Cooking  Use oil to saut or stir-fry foods instead of solid fats such as butter, stick margarine, or lard.  Try baking, boiling, grilling, or broiling instead of frying.  Remove the fatty part of meats before cooking.  Steam vegetables in water or broth. Meal planning  At meals, imagine dividing your plate into fourths: ? One-half of your plate is fruits and vegetables. ? One-fourth of your plate is whole grains. ? One-fourth of your plate is protein, especially lean meats, poultry, eggs, tofu, beans, or nuts.  Include low-fat dairy as part of your daily diet.   Lifestyle  Choose healthy options in all settings, including home, work, school, restaurants, or stores.  Prepare your food safely: ? Wash your hands after handling raw meats. ? Keep food preparation surfaces clean by regularly washing with hot, soapy water. ? Keep raw meats separate from ready-to-eat foods, such as fruits and vegetables. ? Cook seafood, meat, poultry, and eggs to the recommended internal temperature. ? Store foods at safe temperatures. In general:  Keep cold foods at 58F (4.4C) or below.  Keep hot foods at 158F (60C) or above.  Keep your freezer at [facility] (-17.8C) or below.  Foods are no longer safe to eat when they have been between the temperatures of 40-158F (4.4-60C) for more than 2 hours. What foods should I eat? Fruits Aim to eat 2 cup-equivalents of fresh, canned (in natural juice), or frozen fruits each  day. Examples of 1 cup-equivalent of fruit include 1 small apple, 8 large strawberries, 1 cup canned fruit,  cup dried fruit, or 1 cup 100% juice. Vegetables Aim to eat 2-3 cup-equivalents of fresh and frozen vegetables each day, including different varieties and colors. Examples  of 1 cup-equivalent of vegetables include 2 medium carrots, 2 cups raw, leafy greens, 1 cup chopped vegetable (raw or cooked), or 1 medium baked potato. Grains Aim to eat 6 ounce-equivalents of whole grains each day. Examples of 1 ounce-equivalent of grains include 1 slice of bread, 1 cup ready-to-eat cereal, 3 cups popcorn, or  cup cooked rice, pasta, or cereal. Meats and other proteins Aim to eat 5-6 ounce-equivalents of protein each day. Examples of 1 ounce-equivalent of protein include 1 egg, 1/2 cup nuts or seeds, or 1 tablespoon (16 g) peanut butter. A cut of meat or fish that is the size of a deck of cards is about 3-4 ounce-equivalents.  Of the protein you eat each week, try to have at least 8 ounces come from seafood. This includes salmon, trout, herring, and anchovies. Dairy Aim to eat 3 cup-equivalents of fat-free or low-fat dairy each day. Examples of 1 cup-equivalent of dairy include 1 cup (240 mL) milk, 8 ounces (250 g) yogurt, 1 ounces (44 g) natural cheese, or 1 cup (240 mL) fortified soy milk. Fats and oils  Aim for about 5 teaspoons (21 g) per day. Choose monounsaturated fats, such as canola and olive oils, avocados, peanut butter, and most nuts, or polyunsaturated fats, such as sunflower, corn, and soybean oils, walnuts, pine nuts, sesame seeds, sunflower seeds, and flaxseed. Beverages  Aim for six 8-oz glasses of water per day. Limit coffee to three to five 8-oz cups per day.  Limit caffeinated beverages that have added calories, such as soda and energy drinks.  Limit alcohol intake to no more than 1 drink a day for nonpregnant women and 2 drinks a day for men. One drink equals 12 oz of beer (355 mL), 5 oz of wine (148 mL), or 1 oz of hard liquor (44 mL). Seasoning and other foods  Avoid adding excess amounts of salt to your foods. Try flavoring foods with herbs and spices instead of salt.  Avoid adding sugar to foods.  Try using oil-based dressings, sauces, and  spreads instead of solid fats. This information is based on general [place] nutrition guidelines. For more information, visit [employer]. Exact amounts may vary based on your nutrition needs. Summary  A healthy eating plan may help you to maintain a healthy weight, reduce the risk of chronic diseases, and stay active throughout your life.  Plan your meals. Make sure you eat the right portions of a variety of nutrient-rich foods.  Try baking, boiling, grilling, or broiling instead of frying.  Choose healthy options in all settings, including home, work, school, restaurants, or stores. This information is not intended to replace advice given to you by your health care provider. Make sure you discuss any questions you have with your health care provider. Document Revised: 04/07/2017 Document Reviewed: 04/07/2017 Elsevier Patient Education  [place].

## 2020-03-10 NOTE — Progress Notes (Signed)
Patient ID: Dustin [name], [name]    DOB: Aug 23, 1960  MRN: [id number]  CC: Hypertension   Subjective: Dustin [name] is a 60 y.o. 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   Started him on Meloxicam on last visit.  Not taking on regular bases due to limited finances.  RA factor and anti-CCP checked on last visit were normal. Working with lawyer to get [facility] and [employer].   HTN: feeling better on med regiment - Toprol and HCTZ.  Some days he skips a day on taking HCTZ to stretch out med to make it last.  Has limited finances. Not feeling SOB or CP any more. Has not been able to f/u with Dr. Radford Pax to get cardiac CT because he still has not obtain all documents needed to continue application for orange card/cone discount card  Requesting another round abx for gum Reports constant swelling, redness and pain in upper and lower gums. Gets oozing from gums sometimes.    Tob dep: from 1/2 pk a day to 5 cig/day.  Patches helpful.  Needs RF but can not afford them.  Patient Active Problem List   Diagnosis Date Noted  . Influenza vaccination declined 11/11/2019  . Polyarthritis 11/11/2019  . HLD (hyperlipidemia)   . Chronic cough 07/23/2019  . Tobacco dependence 07/23/2019  . Chest pain in adult 07/23/2019  . OSA (obstructive sleep apnea) 05/15/2016  . Neck pain 03/05/2016  . Low back pain 03/05/2016  . DDD (degenerative disc disease), lumbosacral 01/31/2016  . Acute meniscal tear of left knee 07/05/2014  . Left knee pain 06/16/2014  . Essential hypertension 06/16/2014  . Other headache syndrome 02/17/2014  . Chronic pain syndrome 02/17/2014  . Medication overuse headache 05/06/2013  . Dental caries 04/06/2013  . Headache(784.0) 04/06/2013     Current Outpatient Medications on File Prior to Visit  Medication Sig Dispense  Refill  . acetaminophen-codeine (TYLENOL #3) 300-30 MG tablet Take 1 tablet by mouth every 4 (four) hours as needed for moderate pain. (Patient not taking: Reported on 03/10/2020) 20 tablet 0  . amitriptyline (ELAVIL) 50 MG tablet Take 1 tablet (50 mg total) by mouth at bedtime as needed. for sleep 90 tablet 3  . aspirin EC 81 MG tablet Take 1 tablet (81 mg total) by mouth daily. Swallow whole. 100 tablet 1  . hydrochlorothiazide (HYDRODIURIL) 25 MG tablet Take 1 tablet (25 mg total) by mouth daily. 30 tablet 2  . nicotine (NICODERM CQ - DOSED IN MG/24 HOURS) 21 mg/24hr patch Place 1 patch (21 mg total) onto the skin daily. 28 patch 0   No current facility-administered medications on file prior to visit.    No Known Allergies  Social History   Socioeconomic History  . Marital status: Legally Separated    Spouse name: Not on file  . Number of children: Not on file  . Years of education: Not on file  . Highest education level: Not on file  Occupational History  . Not on file  Tobacco Use  . Smoking status: Current Every Day Smoker    Packs/day: 0.50    Types: Cigarettes  . Smokeless tobacco: Never Used  Substance and Sexual Activity  . Alcohol use: Yes    Alcohol/week: 0.0 standard drinks    Comment: occasionally- "3 shots a month"  . Drug  use: No  . Sexual activity: Not Currently    Partners: Female  Other Topics Concern  . Not on file  Social History Narrative  . Not on file   Social Determinants of Health   Financial Resource Strain: Not on file  Food Insecurity: Not on file  Transportation Needs: Not on file  Physical Activity: Not on file  Stress: Not on file  Social Connections: Not on file  Intimate Partner Violence: Not on file    Family History  Problem Relation Age of Onset  . Rheum arthritis Mother   . Peripheral Artery Disease Mother   . Cancer Father   . Diabetes Father     Past Surgical History:  Procedure Laterality Date  . ANTERIOR CRUCIATE  LIGAMENT REPAIR    . NECK SURGERY      ROS: Review of Systems Negative except as stated above  PHYSICAL EXAM: BP 123/85   Pulse 95   Resp 16   Wt 259 lb 3.2 oz (117.6 kg)   SpO2 97%   BMI 33.28 kg/m   Wt Readings from Last 3 Encounters:  03/10/20 259 lb 3.2 oz (117.6 kg)  12/28/19 265 lb (120.2 kg)  09/17/19 257 lb 6.4 oz (116.8 kg)    Physical Exam General appearance - alert, well appearing, Dustin [name] and in no distress Mental status - normal mood, behavior, speech, dress, motor activity, and thought processes.  Patient very talkative  mouth -he has several decayed teeth some of which are broken off in the gum.  Gum in the upper jaw in particular appears inflamed.  No abscess seen at this time. Chest - clear to auscultation, no wheezes, rales or rhonchi, symmetric air entry Heart - normal rate, regular rhythm, normal S1, S2, no murmurs, rubs, clicks or gallops Extremities - peripheral pulses normal, no pedal edema, no clubbing or cyanosis MSK: No signs of active inflammation noted in the wrists or the joints of the hands.  He has good range of motion of the wrists and the hands.  Knees: Joints are mildly enlarged.  He has mild crepitus on passive range of motion of both knees. CMP Latest Ref Rng & Units 07/23/2019 02/21/2017 01/31/2016  Glucose 65 - 99 mg/dL 87 94 90  BUN 6 - 24 mg/dL 11 15 20   Creatinine 0.76 - 1.27 mg/dL 1.20 1.08 1.18  Sodium 134 - 144 mmol/L 141 141 137  Potassium 3.5 - 5.2 mmol/L 5.1 5.2 4.9  Chloride 96 - 106 mmol/L 104 103 106  CO2 20 - 29 mmol/L 25 22 23   Calcium 8.7 - 10.2 mg/dL 9.6 9.6 9.6  Total Protein 6.0 - 8.5 g/dL 7.1 7.3 6.7  Total Bilirubin 0.0 - 1.2 mg/dL 0.2 0.3 0.4  Alkaline Phos 48 - 121 IU/L 72 65 51  AST 0 - 40 IU/L 26 23 19   ALT 0 - 44 IU/L 35 25 28   Lipid Panel     Component Value Date/Time   CHOL 172 07/23/2019 1657   TRIG 235 (H) 07/23/2019 1657   HDL 37 (L) 07/23/2019 1657   CHOLHDL 4.6 07/23/2019 1657    CHOLHDL 4.0 01/31/2016 1047   VLDL 33 (H) 01/31/2016 1047   LDLCALC 95 07/23/2019 1657    CBC    Component Value Date/Time   WBC 9.1 07/23/2019 1657   WBC 10.7 01/31/2016 1047   RBC 5.74 07/23/2019 1657   RBC 5.14 01/31/2016 1047   HGB 17.5 07/23/2019 1657   HCT 53.3 (H)  07/23/2019 1657   PLT 276 07/23/2019 1657   MCV 93 07/23/2019 1657   MCH 30.5 07/23/2019 1657   MCH 31.3 01/31/2016 1047   MCHC 32.8 07/23/2019 1657   MCHC 33.6 01/31/2016 1047   RDW 12.7 07/23/2019 1657   LYMPHSABS 2,996 01/31/2016 1047   MONOABS 642 01/31/2016 1047   EOSABS 214 01/31/2016 1047   BASOSABS 107 01/31/2016 1047    ASSESSMENT AND PLAN: 1. Essential hypertension Close to goal.  Continue metoprolol and hydrochlorothiazide. - metoprolol succinate (TOPROL-XL) 50 MG 24 hr tablet; Take 1 tablet (50 mg total) by mouth daily.  Dispense: 30 tablet; Refill: 2  2. Polyarthritis I have sent a refill on the meloxicam.   - meloxicam (MOBIC) 15 MG tablet; Take 1 tablet (15 mg total) by mouth daily.  Dispense: 30 tablet; Refill: 4  3. Tobacco dependence Commended him on cutting back and trying to quit.  Since these not able to afford the nicotine patches again at this time, I have given him the number for 1 [phone] quit now to call and request the nicotine patches for free.  Encouraged him to set a quit date.  Less than 5 minutes spent on counseling.  4. Dental cavities He should try to get into the dentist as soon as he is able to afford. - amoxicillin (AMOXIL) 500 MG capsule; Take 1 capsule (500 mg total) by mouth 3 (three) times daily.  Dispense: 21 capsule; Refill: 0  5. Obesity (BMI 30.0-34.9) Discussed the importance of getting his weight down through healthy eating habits and trying to move more.  Discussed how weight loss can help decrease mechanical strain on the joints and make arthritis symptoms last.  Printed information given on healthy eating habits.  6. Mixed hyperlipidemia - rosuvastatin  (CRESTOR) 20 MG tablet; Take 1 tablet (20 mg total) by mouth daily.  Dispense: 30 tablet; Refill: 2     Patient was given the opportunity to ask questions.  Patient verbalized understanding of the plan and was able to repeat key elements of the plan.   No orders of the defined types were placed in this encounter.    Requested Prescriptions   Signed Prescriptions Disp Refills  . meloxicam (MOBIC) 15 MG tablet 30 tablet 4    Sig: Take 1 tablet (15 mg total) by mouth daily.  . rosuvastatin (CRESTOR) 20 MG tablet 30 tablet 2    Sig: Take 1 tablet (20 mg total) by mouth daily.  . metoprolol succinate (TOPROL-XL) 50 MG 24 hr tablet 30 tablet 2    Sig: Take 1 tablet (50 mg total) by mouth daily.  Marland Kitchen amoxicillin (AMOXIL) 500 MG capsule 21 capsule 0    Sig: Take 1 capsule (500 mg total) by mouth 3 (three) times daily.    Return in about 4 months (around 07/10/2020).  Karle Plumber, MD, FACP

## 2020-03-10 NOTE — Progress Notes (Signed)
Pt states his pain is coming from arthritis

## 2020-03-14 MED FILL — HYDROCHLOROTHIAZIDE 25 MG T: 25 | 30 days supply | Qty: 30 | Fill #1

## 2020-04-05 MED FILL — METOPROLOL SUCCINATE ER 50: 50 | 30 days supply | Qty: 30 | Fill #0

## 2020-04-08 ENCOUNTER — Other Ambulatory Visit: Payer: Self-pay

## 2020-05-11 ENCOUNTER — Other Ambulatory Visit (HOSPITAL_COMMUNITY): Payer: Self-pay

## 2020-05-11 ENCOUNTER — Other Ambulatory Visit: Payer: Self-pay

## 2020-05-11 MED FILL — Rosuvastatin Calcium Tab 20 MG: ORAL | 30 days supply | Qty: 30 | Fill #0 | Status: AC

## 2020-05-11 MED FILL — Metoprolol Succinate Tab ER 24HR 50 MG (Tartrate Equiv): ORAL | 30 days supply | Qty: 30 | Fill #0 | Status: AC

## 2020-05-11 MED FILL — Hydrochlorothiazide Tab 25 MG: ORAL | 30 days supply | Qty: 30 | Fill #0 | Status: AC

## 2020-05-12 ENCOUNTER — Other Ambulatory Visit: Payer: Self-pay

## 2020-06-16 ENCOUNTER — Other Ambulatory Visit: Payer: Self-pay

## 2020-06-16 ENCOUNTER — Other Ambulatory Visit: Payer: Self-pay | Admitting: Internal Medicine

## 2020-06-16 DIAGNOSIS — I10 Essential (primary) hypertension: Secondary | ICD-10-CM

## 2020-06-16 MED ORDER — HYDROCHLOROTHIAZIDE 25 MG PO TABS
ORAL_TABLET | Freq: Every day | ORAL | 2 refills | Status: DC
  Filled 2020-06-16: qty 30, 30d supply, fill #0
  Filled 2020-07-20: qty 30, 30d supply, fill #1
  Filled 2020-08-30: qty 30, 30d supply, fill #2

## 2020-06-16 MED FILL — Metoprolol Succinate Tab ER 24HR 50 MG (Tartrate Equiv): ORAL | 30 days supply | Qty: 30 | Fill #1 | Status: AC

## 2020-06-16 MED FILL — Rosuvastatin Calcium Tab 20 MG: ORAL | 30 days supply | Qty: 30 | Fill #1 | Status: AC

## 2020-07-06 ENCOUNTER — Other Ambulatory Visit: Payer: Self-pay

## 2020-07-06 ENCOUNTER — Other Ambulatory Visit: Payer: Self-pay | Admitting: Internal Medicine

## 2020-07-06 DIAGNOSIS — I10 Essential (primary) hypertension: Secondary | ICD-10-CM

## 2020-07-06 NOTE — Telephone Encounter (Signed)
   Notes to clinic: pt took more than what the rx says due to being stress with his mom in the hospital....i advise him to come in and get his blood pressure checked. He said he would be willing to come in the morning and get it checked   Requested Prescriptions  Pending Prescriptions Disp Refills   metoprolol succinate (TOPROL-XL) 50 MG 24 hr tablet 30 tablet 2    Sig: TAKE 1 TABLET (50 MG TOTAL) BY MOUTH DAILY.      Cardiovascular:  Beta Blockers Passed - 07/06/2020  1:53 PM      Passed - Last BP in normal range    BP Readings from Last 1 Encounters:  03/10/20 123/85          Passed - Last Heart Rate in normal range    Pulse Readings from Last 1 Encounters:  03/10/20 95          Passed - Valid encounter within last 6 months    Recent Outpatient Visits           3 months ago Essential hypertension   [place], [place]   7 months ago Essential hypertension   [place], Deborah B, MD   9 months ago Essential hypertension   [place], Jarome Matin, RPH-CPP   11 months ago Essential hypertension   [place], Deborah B, MD   3 years ago Essential hypertension   [place], MD       Future Appointments             In 1 week Ladell Pier, MD [place]

## 2020-07-06 NOTE — Telephone Encounter (Signed)
Received teams message from [facility] from the pharmacy    Pt requesting to get his metoprolol filled. He was taking more than what the RX says b/c he is stressed due to his mother being in the hospital. He said his blood pressure is sky high and he is scared to go without it. I told him i would message the clinic and maybe have someone call him about it. I feel like he may need to come in and get his blood pressure checked. He is asking for someone to call him back b/c he is concerned about this blood pressure. [phone]...Marland KitchenMarland KitchenMarland Kitchenluke said he has an appt on 7/7 and that taking too much metoprolol can slow down his HR whatever that is...Marland KitchenMarland KitchenMarland Kitchenwhat does this man need to do? Can someone call him? or do i need to call him back and just tell him to wait until his appt (which he doesn't want to do b/c he is out)?  Will forward to luke to see if he is able to assist

## 2020-07-07 ENCOUNTER — Ambulatory Visit: Payer: Self-pay | Attending: Internal Medicine | Admitting: Internal Medicine

## 2020-07-07 ENCOUNTER — Other Ambulatory Visit: Payer: Self-pay

## 2020-07-07 ENCOUNTER — Encounter: Payer: Self-pay | Admitting: Internal Medicine

## 2020-07-07 VITALS — BP 144/91 | HR 69 | Resp 16 | Wt 247.2 lb

## 2020-07-07 DIAGNOSIS — L989 Disorder of the skin and subcutaneous tissue, unspecified: Secondary | ICD-10-CM

## 2020-07-07 DIAGNOSIS — B001 Herpesviral vesicular dermatitis: Secondary | ICD-10-CM

## 2020-07-07 DIAGNOSIS — I10 Essential (primary) hypertension: Secondary | ICD-10-CM

## 2020-07-07 DIAGNOSIS — E782 Mixed hyperlipidemia: Secondary | ICD-10-CM

## 2020-07-07 MED ORDER — AMLODIPINE BESYLATE 5 MG PO TABS
5.0000 mg | ORAL_TABLET | Freq: Every day | ORAL | 5 refills | Status: DC
  Filled 2020-07-07: qty 30, 30d supply, fill #0
  Filled 2020-08-08: qty 30, 30d supply, fill #1
  Filled 2020-09-12: qty 30, 30d supply, fill #2
  Filled 2020-10-25: qty 30, 30d supply, fill #3

## 2020-07-07 MED ORDER — METOPROLOL SUCCINATE ER 50 MG PO TB24
ORAL_TABLET | Freq: Every day | ORAL | 5 refills | Status: DC
Start: 2020-07-07 — End: 2021-02-09
  Filled 2020-07-07: qty 30, fill #0
  Filled 2020-07-07: qty 30, 30d supply, fill #0
  Filled 2020-08-08: qty 30, 30d supply, fill #1
  Filled 2020-09-12: qty 30, 30d supply, fill #2
  Filled 2020-10-25: qty 30, 30d supply, fill #3
  Filled 2020-11-20: qty 30, 30d supply, fill #4
  Filled 2021-01-03: qty 30, 30d supply, fill #5

## 2020-07-07 MED ORDER — ROSUVASTATIN CALCIUM 20 MG PO TABS
ORAL_TABLET | Freq: Every day | ORAL | 5 refills | Status: DC
  Filled 2020-07-07: qty 30, fill #0
  Filled 2020-07-20: qty 30, 30d supply, fill #0
  Filled 2020-08-30: qty 30, 30d supply, fill #1
  Filled 2020-10-09: qty 30, 30d supply, fill #2
  Filled 2020-11-20: qty 30, 30d supply, fill #3
  Filled 2021-01-03: qty 30, 30d supply, fill #4
  Filled 2021-02-15: qty 30, 30d supply, fill #0

## 2020-07-07 NOTE — Patient Instructions (Signed)
Your blood pressure is not at goal. Continue metoprolol and hydrochlorothiazide.  We have added a new blood pressure medication called amlodipine that she will also take once a day.  Continue to check your blood pressure.  Goal is 130/80 or lower.  Cold Sore  A cold sore, also called a fever blister, is a small, fluid-filled sore that forms inside of the mouth or on the lips, gums, nose, chin, or cheeks. Coldsores can spread to other parts of the body, such as the eyes or fingers. Cold sores can spread from person to person (are contagious) until the sores crust over completely. Most cold sores go away within 2 weeks. What are the causes? Cold sores are caused by a virus (herpes simplex virus type 1, HSV-1). The virus can spread from person to person through close contact, such as through: Kissing. Touching the affected area. Sharing personal items such as lip balm, razors, a drinking glass, or eating utensils. What increases the risk? You are more likely to develop this condition if you: Are tired, stressed, or sick. Are having your period (menstruating). Are pregnant. Take certain medicines. Are out in cold weather or get too much sun. What are the signs or symptoms? Symptoms of a cold sore outbreak go through different stages. These are the stages of a cold sore: Tingling, itching, or burning is felt 1-2 days before the outbreak. Fluid-filled blisters appear on the lips, inside the mouth, on the nose, or on the cheeks. The blisters start to ooze clear fluid. The blisters dry up, and a yellow crust appears in their place. The crust falls off. In some cases, other symptoms can develop during a cold sore outbreak. These can include: Fever. Sore throat. Headache. Muscle aches. Swollen neck glands. How is this treated? There is no cure for cold sores or the virus that causes them. There is also no vaccine to prevent the virus. Most cold sores go away on their own without treatment  within 2 weeks. Your doctor may prescribe medicines to: Help with pain. Keep the virus from growing. Help you heal faster. Medicines may be in the form of creams, gels, pills, or a shot. Follow these instructions at home: Medicines Take or apply over-the-counter and prescription medicines only as told by your doctor. Use a cotton-tip swab to apply creams or gels to your sores. Ask your doctor if you can take lysine supplements. These may help with healing. Sore care  Do not touch the sores or pick the scabs. Wash your hands often. Do not touch your eyes without washing your hands first. Keep the sores clean and dry. If told, put ice on the sores: Put ice in a plastic bag. Place a towel between your skin and the bag. Leave the ice on for 20 minutes, 2-3 times a day.  Eating and drinking Eat a soft, bland diet. Avoid eating hot, cold, or salty foods. These can hurt your mouth. Use a straw if it hurts to drink out of a glass. Eat foods that have a lot of lysine in them. These include meat, fish, and dairy products. Avoid sugary foods, chocolates, nuts, and grains. These foods have a high amount of a substance (arginine) that can cause the virus to grow. Lifestyle Do not kiss, have oral sex, or share personal items until your sores heal. Stress, poor sleep, and being out in the sun can trigger outbreaks. Make sure you: Do activities that help you relax, such as deep breathing exercises or meditation. Get  enough sleep. Apply sunscreen on your lips before you go out in the sun. Contact a doctor if: You have symptoms for more than 2 weeks. You have pus coming from the sores. You have redness that is spreading. You have pain or irritation in your eye. You get sores on your genitals. Your sores do not heal within 2 weeks. You get cold sores often. Get help right away if: You have a fever and your symptoms suddenly get worse. You have a headache and confusion. You have tiredness  (fatigue). You do not want to eat as much as normal (loss of appetite). You have a stiff neck or are sensitive to light. Summary A cold sore is a small, fluid-filled sore that forms inside of the mouth or on the lips, gums, nose, chin, or cheeks. Cold sores can spread from person to person (are contagious) until the sores crust over completely. Most cold sores go away within 2 weeks. Wash your hands often. Do not touch your eyes without washing your hands first. Do not kiss, have oral sex, or share personal items until your sores heal. Contact a doctor if your sores do not heal within 2 weeks. This information is not intended to replace advice given to you by your health care provider. Make sure you discuss any questions you have with your healthcare provider. Document Revised: 04/15/2018 Document Reviewed: 05/26/2017 Elsevier Patient Education  [place].
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
## 2020-07-07 NOTE — Progress Notes (Signed)
Pt states he has sores in his nose and on his lips

## 2020-07-13 ENCOUNTER — Ambulatory Visit: Payer: Self-pay | Admitting: Internal Medicine

## 2020-07-20 ENCOUNTER — Other Ambulatory Visit: Payer: Self-pay

## 2020-07-24 ENCOUNTER — Other Ambulatory Visit: Payer: Self-pay

## 2020-08-08 ENCOUNTER — Other Ambulatory Visit: Payer: Self-pay

## 2020-08-30 ENCOUNTER — Other Ambulatory Visit: Payer: Self-pay

## 2020-09-12 ENCOUNTER — Other Ambulatory Visit: Payer: Self-pay | Admitting: Internal Medicine

## 2020-09-12 ENCOUNTER — Other Ambulatory Visit: Payer: Self-pay

## 2020-09-12 DIAGNOSIS — I10 Essential (primary) hypertension: Secondary | ICD-10-CM

## 2020-09-13 NOTE — Telephone Encounter (Signed)
Requested medications are due for refill today.  yes  Requested medications are on the active medications list.  yes  Last refill. 06/16/2020  Future visit scheduled.   yes  Notes to clinic.  Failed protocol d/t expired labs.

## 2020-09-14 ENCOUNTER — Other Ambulatory Visit: Payer: Self-pay

## 2020-09-14 MED ORDER — HYDROCHLOROTHIAZIDE 25 MG PO TABS
ORAL_TABLET | Freq: Every day | ORAL | 2 refills | Status: DC
Start: 2020-09-14 — End: 2021-02-15
  Filled 2020-09-14: qty 30, fill #0
  Filled 2020-10-09: qty 30, 30d supply, fill #0
  Filled 2020-11-20: qty 30, 30d supply, fill #1
  Filled 2021-01-03: qty 30, 30d supply, fill #2

## 2020-10-09 ENCOUNTER — Other Ambulatory Visit: Payer: Self-pay

## 2020-10-10 ENCOUNTER — Other Ambulatory Visit: Payer: Self-pay

## 2020-10-11 ENCOUNTER — Other Ambulatory Visit: Payer: Self-pay

## 2020-10-25 ENCOUNTER — Other Ambulatory Visit: Payer: Self-pay

## 2020-10-26 ENCOUNTER — Other Ambulatory Visit: Payer: Self-pay

## 2020-11-07 ENCOUNTER — Ambulatory Visit: Payer: Self-pay | Attending: Internal Medicine | Admitting: Internal Medicine

## 2020-11-07 ENCOUNTER — Other Ambulatory Visit: Payer: Self-pay

## 2020-11-07 ENCOUNTER — Encounter: Payer: Self-pay | Admitting: Internal Medicine

## 2020-11-07 VITALS — BP 142/98 | HR 87 | Resp 16 | Wt 253.2 lb

## 2020-11-07 DIAGNOSIS — D72829 Elevated white blood cell count, unspecified: Secondary | ICD-10-CM

## 2020-11-07 DIAGNOSIS — F17200 Nicotine dependence, unspecified, uncomplicated: Secondary | ICD-10-CM

## 2020-11-07 DIAGNOSIS — I10 Essential (primary) hypertension: Secondary | ICD-10-CM

## 2020-11-07 DIAGNOSIS — E782 Mixed hyperlipidemia: Secondary | ICD-10-CM

## 2020-11-07 DIAGNOSIS — E669 Obesity, unspecified: Secondary | ICD-10-CM

## 2020-11-07 DIAGNOSIS — Z125 Encounter for screening for malignant neoplasm of prostate: Secondary | ICD-10-CM

## 2020-11-07 DIAGNOSIS — Z2821 Immunization not carried out because of patient refusal: Secondary | ICD-10-CM

## 2020-11-07 DIAGNOSIS — Z1211 Encounter for screening for malignant neoplasm of colon: Secondary | ICD-10-CM

## 2020-11-07 DIAGNOSIS — M1731 Unilateral post-traumatic osteoarthritis, right knee: Secondary | ICD-10-CM

## 2020-11-07 MED ORDER — MELOXICAM 15 MG PO TABS
ORAL_TABLET | Freq: Every day | ORAL | 4 refills | Status: DC
  Filled 2020-11-07: qty 30, 30d supply, fill #0
  Filled 2021-01-03: qty 30, 30d supply, fill #1
  Filled 2021-02-15: qty 30, 30d supply, fill #0
  Filled 2021-04-13: qty 30, 30d supply, fill #1
  Filled 2021-05-24: qty 30, 30d supply, fill #2

## 2020-11-07 MED ORDER — ACETAMINOPHEN-CODEINE #3 300-30 MG PO TABS
1.0000 | ORAL_TABLET | Freq: Three times a day (TID) | ORAL | 0 refills | Status: DC | PRN
  Filled 2020-11-07: qty 40, 7d supply, fill #0

## 2020-11-07 MED ORDER — AMLODIPINE BESYLATE 10 MG PO TABS
10.0000 mg | ORAL_TABLET | Freq: Every day | ORAL | 6 refills | Status: DC
  Filled 2020-11-07: qty 30, 30d supply, fill #0
  Filled 2021-01-03: qty 30, 30d supply, fill #1
  Filled 2021-02-15: qty 30, 30d supply, fill #0
  Filled 2021-04-13: qty 30, 30d supply, fill #1
  Filled 2021-05-24: qty 30, 30d supply, fill #2
  Filled 2021-07-12: qty 30, 30d supply, fill #3

## 2020-11-07 NOTE — Patient Instructions (Signed)
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
## 2020-11-08 LAB — CBC
Hematocrit: 52.7 % — ABNORMAL HIGH (ref 37.5–51.0)
Hemoglobin: 17.8 g/dL — ABNORMAL HIGH (ref 13.0–17.7)
MCH: 30.7 pg (ref 26.6–33.0)
MCHC: 33.8 g/dL (ref 31.5–35.7)
MCV: 91 fL (ref 79–97)
Platelets: 302 10*3/uL (ref 150–450)
RBC: 5.79 x10E6/uL (ref 4.14–5.80)
RDW: 12.7 % (ref 11.6–15.4)
WBC: 12.7 10*3/uL — ABNORMAL HIGH (ref 3.4–10.8)

## 2020-11-08 LAB — COMPREHENSIVE METABOLIC PANEL
ALT: 30 IU/L (ref 0–44)
AST: 23 IU/L (ref 0–40)
Albumin/Globulin Ratio: 2.1 (ref 1.2–2.2)
Albumin: 5 g/dL — ABNORMAL HIGH (ref 3.8–4.9)
Alkaline Phosphatase: 72 IU/L (ref 44–121)
BUN/Creatinine Ratio: 12 (ref 9–20)
BUN: 13 mg/dL (ref 6–24)
Bilirubin Total: 0.3 mg/dL (ref 0.0–1.2)
CO2: 23 mmol/L (ref 20–29)
Calcium: 9.6 mg/dL (ref 8.7–10.2)
Chloride: 100 mmol/L (ref 96–106)
Creatinine, Ser: 1.12 mg/dL (ref 0.76–1.27)
Globulin, Total: 2.4 g/dL (ref 1.5–4.5)
Glucose: 118 mg/dL — ABNORMAL HIGH (ref 70–99)
Potassium: 4.5 mmol/L (ref 3.5–5.2)
Sodium: 139 mmol/L (ref 134–144)
Total Protein: 7.4 g/dL (ref 6.0–8.5)
eGFR: 76 mL/min/{1.73_m2} (ref >59)

## 2020-11-08 LAB — LIPID PANEL
Chol/HDL Ratio: 2.8 ratio (ref 0.0–5.0)
Cholesterol, Total: 107 mg/dL (ref 100–199)
HDL: 38 mg/dL — ABNORMAL LOW (ref >39)
LDL Chol Calc (NIH): 41 mg/dL (ref 0–99)
Triglycerides: 165 mg/dL — ABNORMAL HIGH (ref 0–149)
VLDL Cholesterol Cal: 28 mg/dL (ref 5–40)

## 2020-11-08 LAB — PSA: Prostate Specific Ag, Serum: 0.3 ng/mL (ref 0.0–4.0)

## 2020-11-08 NOTE — Progress Notes (Signed)
Let patient know that his white blood cell count count is mildly elevated.  This may be due to cigarette smoking.  Encourage him to try to cut back more on smoking.  I have added additional lab test to blood that was already drawn to determine whether there is another cause for the elevated cell counts.  Blood sugar level was mildly elevated.  Test added to screen for diabetes.  Cholesterol levels are good.

## 2020-11-08 NOTE — Addendum Note (Signed)
Addended by: Karle Plumber B on: 11/08/2020 10:01 AM   Modules accepted: Orders

## 2020-11-09 LAB — HEMOGLOBIN A1C

## 2020-11-14 LAB — WHITE BLOOD COUNT AND DIFFERENTIAL

## 2020-11-14 LAB — SPECIMEN STATUS REPORT

## 2020-11-20 ENCOUNTER — Other Ambulatory Visit: Payer: Self-pay

## 2020-11-20 ENCOUNTER — Other Ambulatory Visit: Payer: Self-pay | Admitting: Internal Medicine

## 2020-11-20 DIAGNOSIS — M1731 Unilateral post-traumatic osteoarthritis, right knee: Secondary | ICD-10-CM

## 2020-11-20 NOTE — Telephone Encounter (Signed)
Requested medications are due for refill today Unsure, filled 11/1 but can take 6 a day.  Requested medications are on the active medication list yes  Last refill 11/07/20  Last visit 11/07/20  Future visit scheduled no  Notes to clinic This medication can not be delegated, please assess.   Requested Prescriptions  Pending Prescriptions Disp Refills   acetaminophen-codeine (TYLENOL #3) 300-30 MG tablet 40 tablet 0    Sig: Take 1-2 tablets by mouth every 8 (eight) hours as needed for moderate pain.     Not Delegated - Analgesics:  Opioid Agonist Combinations Failed - 11/20/2020 12:28 PM      Failed - This refill cannot be delegated      Failed - Urine Drug Screen completed in last 360 days      Passed - Valid encounter within last 6 months    Recent Outpatient Visits           1 week ago Essential hypertension   [place], MD   4 months ago Essential hypertension   [place], Deborah B, MD   8 months ago Essential hypertension   [place], Deborah B, MD   1 year ago Essential hypertension   [place], Deborah B, MD   1 year ago Essential hypertension   [place], RPH-CPP

## 2020-11-22 ENCOUNTER — Other Ambulatory Visit: Payer: Self-pay

## 2020-11-24 ENCOUNTER — Other Ambulatory Visit: Payer: Self-pay

## 2021-01-03 ENCOUNTER — Other Ambulatory Visit: Payer: Self-pay

## 2021-02-05 ENCOUNTER — Other Ambulatory Visit: Payer: Self-pay | Admitting: Internal Medicine

## 2021-02-05 ENCOUNTER — Other Ambulatory Visit: Payer: Self-pay

## 2021-02-05 ENCOUNTER — Ambulatory Visit: Payer: Self-pay | Admitting: *Deleted

## 2021-02-05 NOTE — Telephone Encounter (Signed)
°  Chief Complaint: medication RF request- expired Rx Symptoms: headache, patient reports his BP is elevated in am and it takes all day to go down- and then not as low as needs to be Frequency:   Pertinent Negatives: Patient denies chest pain Disposition: [] ED /[] Urgent Care (no appt availability in office) / [x] Appointment(In office/virtual)/ []  [facility]/ [] Home Care/ [] Refused Recommended Disposition /[]  Mobile Bus/ []  Follow-up with PCP Additional Notes: Appointment scheduled at first available- may need to be moved up. Patient requesting RF on expired Rx. He had stopped taking due to cost. Please let him know if it can be refilled.

## 2021-02-05 NOTE — Telephone Encounter (Signed)
Summary: refill on medication   PT called asking for Dr. Wynetta Emery or her nurse to call him back regarding his medication refill for on his blood pressure medication.  He called [facility] pharmacy and they could not refill it   CB#  [phone]     Patient states he would like to get refills on his Amitriptyline 50 mg. Patient has not been filling it due to cost. He would like to get a 1 month supply. Recent weeks- increased BP in mornings and headaches. Patient feels the Amitriptyline will help lower his BP. Reason for Disposition  [1] Prescription refill request for NON-ESSENTIAL medicine (i.e., no harm to patient if med not taken) AND [2] triager unable to refill per department policy  Answer Assessment - Initial Assessment Questions 1. DRUG NAME: "What medicine do you need to have refilled?"     Amitriptyline 50 mg 2. REFILLS REMAINING: "How many refills are remaining?" (Note: The label on the medicine or pill bottle will show how many refills are remaining. If there are no refills remaining, then a renewal may be needed.)     expired 3. EXPIRATION DATE: "What is the expiration date?" (Note: The label states when the prescription will expire, and thus can no longer be refilled.)       4. PRESCRIBING HCP: "Who prescribed it?" Reason: If prescribed by specialist, call should be referred to that group.     Outside provider 5. SYMPTOMS: "Do you have any symptoms?"     Headaches 6. PREGNANCY: "Is there any chance that you are pregnant?" "When was your last menstrual period?"  Protocols used: Medication Refill and Renewal Call-A-AH

## 2021-02-05 NOTE — Telephone Encounter (Signed)
Requested medications are due for refill today.  yes  Requested medications are on the active medications list.  yes  Last refill. 07/23/2019 #90 with 3 refills  Future visit scheduled.   yes  Notes to clinic.  Rx expired 07/22/2020.    Requested Prescriptions  Pending Prescriptions Disp Refills   amitriptyline (ELAVIL) 50 MG tablet 90 tablet 3    Sig: TAKE 1 TABLET (50 MG TOTAL) BY MOUTH AT BEDTIME AS NEEDED. FOR SLEEP     Psychiatry:  Antidepressants - Heterocyclics (TCAs) Passed - 02/05/2021  2:41 PM      Passed - Valid encounter within last 6 months    Recent Outpatient Visits           3 months ago Essential hypertension   [place], MD   7 months ago Essential hypertension   [place], MD   11 months ago Essential hypertension   [place], Deborah B, MD   1 year ago Essential hypertension   [place], Deborah B, MD   1 year ago Essential hypertension   [place], [place], RPH-CPP       Future Appointments             In 1 month Wynetta Emery, Dalbert Batman, MD [place]

## 2021-02-06 ENCOUNTER — Other Ambulatory Visit: Payer: Self-pay

## 2021-02-06 NOTE — Telephone Encounter (Signed)
Will forward to pcp

## 2021-02-07 ENCOUNTER — Other Ambulatory Visit: Payer: Self-pay

## 2021-02-07 MED ORDER — AMITRIPTYLINE HCL 50 MG PO TABS
ORAL_TABLET | Freq: Every evening | ORAL | 0 refills | Status: DC | PRN
  Filled 2021-02-07: qty 30, 30d supply, fill #0
  Filled 2021-04-13: qty 30, 30d supply, fill #1
  Filled 2021-05-24: qty 30, 30d supply, fill #2

## 2021-02-07 NOTE — Addendum Note (Signed)
Addended by: Karle Plumber B on: 02/07/2021 09:20 AM   Modules accepted: Orders

## 2021-02-09 ENCOUNTER — Other Ambulatory Visit: Payer: Self-pay | Admitting: Internal Medicine

## 2021-02-09 ENCOUNTER — Other Ambulatory Visit: Payer: Self-pay

## 2021-02-09 DIAGNOSIS — I10 Essential (primary) hypertension: Secondary | ICD-10-CM

## 2021-02-09 MED ORDER — METOPROLOL SUCCINATE ER 50 MG PO TB24
ORAL_TABLET | Freq: Every day | ORAL | 0 refills | Status: DC
  Filled 2021-02-09: qty 30, 30d supply, fill #0
  Filled 2021-04-13 (×2): qty 30, 30d supply, fill #1
  Filled 2021-05-24: qty 30, 30d supply, fill #2

## 2021-02-09 NOTE — Telephone Encounter (Signed)
Requested Prescriptions  Pending Prescriptions Disp Refills   metoprolol succinate (TOPROL-XL) 50 MG 24 hr tablet 30 tablet 5    Sig: TAKE 1 TABLET (50 MG TOTAL) BY MOUTH DAILY.     Cardiovascular:  Beta Blockers Failed - 02/09/2021 10:31 AM      Failed - Last BP in normal range    BP Readings from Last 1 Encounters:  11/07/20 (!) 142/98         Passed - Last Heart Rate in normal range    Pulse Readings from Last 1 Encounters:  11/07/20 87         Passed - Valid encounter within last 6 months    Recent Outpatient Visits          3 months ago Essential hypertension   [place], MD   7 months ago Essential hypertension   [place], MD   11 months ago Essential hypertension   [place], Deborah B, MD   1 year ago Essential hypertension   [place], Deborah B, MD   1 year ago Essential hypertension   [place], Jarome Matin, RPH-CPP      Future Appointments            In 1 month Wynetta Emery, Dalbert Batman, MD [place]

## 2021-02-09 NOTE — Telephone Encounter (Signed)
Copied from [place] [id number]. Topic: Quick Communication - Rx Refill/Question >> Feb 09, 2021  9:21 AM Tessa Lerner A wrote: Medication: Rx #: [id number]  metoprolol succinate (TOPROL-XL) 50 MG 24 hr tablet [[id number]]    Has the patient contacted their pharmacy? Yes.   (Agent: If no, request that the patient contact the pharmacy for the refill. If patient does not wish to contact the pharmacy document the reason why and proceed with request.) (Agent: If yes, when and what did the pharmacy advise?)  Preferred Pharmacy (with phone number or street name): [facility] [place] [address], [place] [address] Phone: [phone] Fax: [phone]   Has the patient been seen for an appointment in the last year OR does the patient have an upcoming appointment? Yes.    Agent: Please be advised that RX refills may take up to 3 business days. We ask that you follow-up with your pharmacy.

## 2021-02-15 ENCOUNTER — Other Ambulatory Visit: Payer: Self-pay | Admitting: Internal Medicine

## 2021-02-15 ENCOUNTER — Other Ambulatory Visit: Payer: Self-pay

## 2021-02-15 DIAGNOSIS — I10 Essential (primary) hypertension: Secondary | ICD-10-CM

## 2021-02-15 MED ORDER — HYDROCHLOROTHIAZIDE 25 MG PO TABS
ORAL_TABLET | Freq: Every day | ORAL | 2 refills | Status: DC
  Filled 2021-02-15: qty 30, 30d supply, fill #0
  Filled 2021-04-13: qty 30, 30d supply, fill #1
  Filled 2021-05-24: qty 30, 30d supply, fill #2

## 2021-02-15 NOTE — Telephone Encounter (Signed)
Requested Prescriptions  Pending Prescriptions Disp Refills   hydrochlorothiazide (HYDRODIURIL) 25 MG tablet 30 tablet 2    Sig: TAKE 1 TABLET (25 MG TOTAL) BY MOUTH DAILY.     Cardiovascular: Diuretics - Thiazide Failed - 02/15/2021 11:38 AM      Failed - Last BP in normal range    BP Readings from Last 1 Encounters:  11/07/20 (!) 142/98         Passed - Cr in normal range and within 180 days    Creatinine  Date Value Ref Range Status  02/21/2017 182.0 20.0 - 300.0 mg/dL Final   Creat  Date Value Ref Range Status  01/31/2016 1.18 0.70 - 1.33 mg/dL Final    Comment:      For patients > or = 59 years of age: The upper reference limit for Creatinine is approximately 13% higher for people identified as African-American.      Creatinine, Ser  Date Value Ref Range Status  11/07/2020 1.12 0.76 - 1.27 mg/dL Final         Passed - K in normal range and within 180 days    Potassium  Date Value Ref Range Status  11/07/2020 4.5 3.5 - 5.2 mmol/L Final         Passed - Na in normal range and within 180 days    Sodium  Date Value Ref Range Status  11/07/2020 139 134 - 144 mmol/L Final         Passed - Valid encounter within last 6 months    Recent Outpatient Visits          3 months ago Essential hypertension   [place], MD   7 months ago Essential hypertension   [place], MD   11 months ago Essential hypertension   [place], Deborah B, MD   1 year ago Essential hypertension   [place], MD   1 year ago Essential hypertension   [place], RPH-CPP      Future Appointments            In 1 month Wynetta Emery, Dalbert Batman, MD [place]

## 2021-02-16 ENCOUNTER — Other Ambulatory Visit: Payer: Self-pay

## 2021-04-02 ENCOUNTER — Encounter: Payer: Self-pay | Admitting: Internal Medicine

## 2021-04-02 ENCOUNTER — Ambulatory Visit: Payer: Self-pay | Attending: Internal Medicine | Admitting: Internal Medicine

## 2021-04-02 ENCOUNTER — Other Ambulatory Visit: Payer: Self-pay

## 2021-04-02 VITALS — BP 134/90 | HR 84 | Resp 16 | Wt 256.6 lb

## 2021-04-02 DIAGNOSIS — E782 Mixed hyperlipidemia: Secondary | ICD-10-CM

## 2021-04-02 DIAGNOSIS — Z860100 Personal history of colon polyps, unspecified: Secondary | ICD-10-CM

## 2021-04-02 DIAGNOSIS — I10 Essential (primary) hypertension: Secondary | ICD-10-CM

## 2021-04-02 DIAGNOSIS — E669 Obesity, unspecified: Secondary | ICD-10-CM

## 2021-04-02 DIAGNOSIS — E66811 Obesity, class 1: Secondary | ICD-10-CM

## 2021-04-02 DIAGNOSIS — K029 Dental caries, unspecified: Secondary | ICD-10-CM

## 2021-04-02 DIAGNOSIS — F17200 Nicotine dependence, unspecified, uncomplicated: Secondary | ICD-10-CM

## 2021-04-02 DIAGNOSIS — Z1211 Encounter for screening for malignant neoplasm of colon: Secondary | ICD-10-CM

## 2021-04-02 MED ORDER — AMOXICILLIN 500 MG PO CAPS
500.0000 mg | ORAL_CAPSULE | Freq: Three times a day (TID) | ORAL | 0 refills | Status: DC
  Filled 2021-04-02: qty 21, 7d supply, fill #0

## 2021-04-02 NOTE — Patient Instructions (Signed)
Healthy Eating ?Following a healthy eating pattern may help you to achieve and maintain a healthy body weight, reduce the risk of chronic disease, and live a long and productive life. It is important to follow a healthy eating pattern at an appropriate calorie level for your body. Your nutritional needs should be met primarily through food by choosing a variety of nutrient-rich foods. ?What are tips for following this plan? ?Reading food labels ?Read labels and choose the following: ?Reduced or low sodium. ?Juices with 100% fruit juice. ?Foods with low saturated fats and high polyunsaturated and monounsaturated fats. ?Foods with whole grains, such as whole wheat, cracked wheat, brown rice, and wild rice. ?Whole grains that are fortified with folic acid. This is recommended for women who are pregnant or who want to become pregnant. ?Read labels and avoid the following: ?Foods with a lot of added sugars. These include foods that contain brown sugar, corn sweetener, corn syrup, dextrose, fructose, glucose, high-fructose corn syrup, honey, invert sugar, lactose, malt syrup, maltose, molasses, raw sugar, sucrose, trehalose, or turbinado sugar. ?Do not eat more than the following amounts of added sugar per day: ?6 teaspoons (25 g) for women. ?9 teaspoons (38 g) for men. ?Foods that contain processed or refined starches and grains. ?Refined grain products, such as white flour, degermed cornmeal, white bread, and white rice. ?Shopping ?Choose nutrient-rich snacks, such as vegetables, whole fruits, and nuts. Avoid high-calorie and high-sugar snacks, such as potato chips, fruit snacks, and candy. ?Use oil-based dressings and spreads on foods instead of solid fats such as butter, stick margarine, or cream cheese. ?Limit pre-made sauces, mixes, and "instant" products such as flavored rice, instant noodles, and ready-made pasta. ?Try more plant-protein sources, such as tofu, tempeh, black beans, edamame, lentils, nuts, and  seeds. ?Explore eating plans such as the Mediterranean diet or vegetarian diet. ?Cooking ?Use oil to saut? or stir-fry foods instead of solid fats such as butter, stick margarine, or lard. ?Try baking, boiling, grilling, or broiling instead of frying. ?Remove the fatty part of meats before cooking. ?Steam vegetables in water or broth. ?Meal planning ? ?At meals, imagine dividing your plate into fourths: ?One-half of your plate is fruits and vegetables. ?One-fourth of your plate is whole grains. ?One-fourth of your plate is protein, especially lean meats, poultry, eggs, tofu, beans, or nuts. ?Include low-fat dairy as part of your daily diet. ?Lifestyle ?Choose healthy options in all settings, including home, work, school, restaurants, or stores. ?Prepare your food safely: ?Wash your hands after handling raw meats. ?Keep food preparation surfaces clean by regularly washing with hot, soapy water. ?Keep raw meats separate from ready-to-eat foods, such as fruits and vegetables. ?Cook seafood, meat, poultry, and eggs to the recommended internal temperature. ?Store foods at safe temperatures. In general: ?Keep cold foods at 40?F (4.4?C) or below. ?Keep hot foods at 140?F (60?C) or above. ?Keep your freezer at 0?F (-17.8?C) or below. ?Foods are no longer safe to eat when they have been between the temperatures of 40?-140?F (4.4-60?C) for more than 2 hours. ?What foods should I eat? ?Fruits ?Aim to eat 2 cup-equivalents of fresh, canned (in natural juice), or frozen fruits each day. Examples of 1 cup-equivalent of fruit include 1 small apple, 8 large strawberries, 1 cup canned fruit, ? cup dried fruit, or 1 cup 100% juice. ?Vegetables ?Aim to eat 2?-3 cup-equivalents of fresh and frozen vegetables each day, including different varieties and colors. Examples of 1 cup-equivalent of vegetables include 2 medium carrots, 2 cups raw,  leafy greens, 1 cup chopped vegetable (raw or cooked), or 1 medium baked potato. ?Grains ?Aim to  eat 6 ounce-equivalents of whole grains each day. Examples of 1 ounce-equivalent of grains include 1 slice of bread, 1 cup ready-to-eat cereal, 3 cups popcorn, or ? cup cooked rice, pasta, or cereal. ?Meats and other proteins ?Aim to eat 5-6 ounce-equivalents of protein each day. Examples of 1 ounce-equivalent of protein include 1 egg, 1/2 cup nuts or seeds, or 1 tablespoon (16 g) peanut butter. A cut of meat or fish that is the size of a deck of cards is about 3-4 ounce-equivalents. ?Of the protein you eat each week, try to have at least 8 ounces come from seafood. This includes salmon, trout, herring, and anchovies. ?Dairy ?Aim to eat 3 cup-equivalents of fat-free or low-fat dairy each day. Examples of 1 cup-equivalent of dairy include 1 cup (240 mL) milk, 8 ounces (250 g) yogurt, 1? ounces (44 g) natural cheese, or 1 cup (240 mL) fortified soy milk. ?Fats and oils ?Aim for about 5 teaspoons (21 g) per day. Choose monounsaturated fats, such as canola and olive oils, avocados, peanut butter, and most nuts, or polyunsaturated fats, such as sunflower, corn, and soybean oils, walnuts, pine nuts, sesame seeds, sunflower seeds, and flaxseed. ?Beverages ?Aim for six 8-oz glasses of water per day. Limit coffee to three to five 8-oz cups per day. ?Limit caffeinated beverages that have added calories, such as soda and energy drinks. ?Limit alcohol intake to no more than 1 drink a day for nonpregnant women and 2 drinks a day for men. One drink equals 12 oz of beer (355 mL), 5 oz of wine (148 mL), or 1? oz of hard liquor (44 mL). ?Seasoning and other foods ?Avoid adding excess amounts of salt to your foods. Try flavoring foods with herbs and spices instead of salt. ?Avoid adding sugar to foods. ?Try using oil-based dressings, sauces, and spreads instead of solid fats. ?This information is based on general [place] nutrition guidelines. For more information, visit [employer]es. Exact amounts may vary based on your nutrition  needs. ?Summary ?A healthy eating plan may help you to maintain a healthy weight, reduce the risk of chronic diseases, and stay active throughout your life. ?Plan your meals. Make sure you eat the right portions of a variety of nutrient-rich foods. ?Try baking, boiling, grilling, or broiling instead of frying. ?Choose healthy options in all settings, including home, work, school, restaurants, or stores. ?This information is not intended to replace advice given to you by your health care provider. Make sure you discuss any questions you have with your health care provider. ?Document Revised: 08/22/2020 Document Reviewed: 08/22/2020 ?Elsevier Patient Education ? [place]. ? ?

## 2021-04-02 NOTE — Progress Notes (Signed)
? ? ?Patient ID: Dustin [name], male    DOB: 1960-01-27  MRN: [id number] ? ?CC: Hypertension and Referral (GI/Pt states he rather be referred to GI to get colonoscopy instead of doing Fit test ) ? ? ?Subjective: ?Dustin [name] is a 61 y.o. male who presents for chronic ds management ?His concerns today include:  ?Pt with hx of HTN, HL, OA, obesity, DD LS spine, false positive HIV testing 2018, tob dep  ? ?HM:  given FIT test kit for colon CA screen. Loss hat. Reports he has blood in stools with BM.  Hx of colon polyps. Prefers c-scope.   ?Still working on getting OC.  ? ?HTN:  Did not take meds as yet this a.m. ?Range 130-140/80-85 range.  Checks 2-3x/day.  If he does not take Elavil at nights, next morning BP higher than should be ?Limits salt ?No CP/SOB ?Still smoking.  On last visit he reported he had cut back.  Still not ready to quit yet.   ?Drinks green tea, loves [employer], milk and cake.  Not as active as should be ? ?C/o dental pain x 2-3 wks.  Feels he has infection.  Request abx. ?Has cavities but can not afford to see dentist ? ?HL:  taking and tolerating crestor ? ?Patient Active Problem List  ? Diagnosis Date Noted  ? Obesity (BMI 30.0-34.9) 03/10/2020  ? Influenza vaccination declined 11/11/2019  ? Polyarthritis 11/11/2019  ? HLD (hyperlipidemia)   ? Chronic cough 07/23/2019  ? Tobacco dependence 07/23/2019  ? Chest pain in adult 07/23/2019  ? OSA (obstructive sleep apnea) 05/15/2016  ? Neck pain 03/05/2016  ? Low back pain 03/05/2016  ? DDD (degenerative disc disease), lumbosacral 01/31/2016  ? Acute meniscal tear of left knee 07/05/2014  ? Left knee pain 06/16/2014  ? Essential hypertension 06/16/2014  ? Other headache syndrome 02/17/2014  ? Chronic pain syndrome 02/17/2014  ? Medication overuse headache 05/06/2013  ? Dental caries 04/06/2013  ? Headache(784.0) 04/06/2013  ?  ? ?Current Outpatient Medications on File Prior to Visit  ?Medication Sig Dispense Refill  ? acetaminophen-codeine (TYLENOL  #3) 300-30 MG tablet Take 1-2 tablets by mouth every 8 (eight) hours as needed for moderate pain. 40 tablet 0  ? amitriptyline (ELAVIL) 50 MG tablet TAKE 1 TABLET (50 MG TOTAL) BY MOUTH AT BEDTIME AS NEEDED. FOR SLEEP 90 tablet 0  ? amLODipine (NORVASC) 10 MG tablet Take 1 tablet (10 mg total) by mouth daily. 30 tablet 6  ? aspirin EC 81 MG tablet Take 1 tablet (81 mg total) by mouth daily. Swallow whole. 100 tablet 1  ? hydrochlorothiazide (HYDRODIURIL) 25 MG tablet TAKE 1 TABLET (25 MG TOTAL) BY MOUTH DAILY. 30 tablet 2  ? meloxicam (MOBIC) 15 MG tablet TAKE 1 TABLET (15 MG TOTAL) BY MOUTH DAILY. 30 tablet 4  ? metoprolol succinate (TOPROL-XL) 50 MG 24 hr tablet TAKE 1 TABLET (50 MG TOTAL) BY MOUTH DAILY. 90 tablet 0  ? nicotine (NICODERM CQ - DOSED IN MG/24 HOURS) 21 mg/24hr patch Place 1 patch (21 mg total) onto the skin daily. 28 patch 0  ? rosuvastatin (CRESTOR) 20 MG tablet TAKE 1 TABLET (20 MG TOTAL) BY MOUTH DAILY. 30 tablet 5  ? ?No current facility-administered medications on file prior to visit.  ? ? ?No Known Allergies ? ?Social History  ? ?Socioeconomic History  ? Marital status: Legally Separated  ?  Spouse name: Not on file  ? Number of children: Not on file  ? Years  of education: Not on file  ? Highest education level: Not on file  ?Occupational History  ? Not on file  ?Tobacco Use  ? Smoking status: Every Day  ?  Packs/day: 0.50  ?  Types: Cigarettes  ? Smokeless tobacco: Never  ?Substance and Sexual Activity  ? Alcohol use: Yes  ?  Alcohol/week: 0.0 standard drinks  ?  Comment: occasionally- "3 shots a month"  ? Drug use: No  ? Sexual activity: Not Currently  ?  Partners: Female  ?Other Topics Concern  ? Not on file  ?Social History Narrative  ? Not on file  ? ?Social Determinants of Health  ? ?Financial Resource Strain: Not on file  ?Food Insecurity: Not on file  ?Transportation Needs: Not on file  ?Physical Activity: Not on file  ?Stress: Not on file  ?Social Connections: Not on file  ?Intimate  Partner Violence: Not on file  ? ? ?Family History  ?Problem Relation Age of Onset  ? Rheum arthritis Mother   ? Peripheral Artery Disease Mother   ? Cancer Father   ? Diabetes Father   ? ? ?Past Surgical History:  ?Procedure Laterality Date  ? ANTERIOR CRUCIATE LIGAMENT REPAIR    ? NECK SURGERY    ? ? ?ROS: ?Review of Systems ?Negative except as stated above ? ?PHYSICAL EXAM: ?BP 134/90   Pulse 84   Resp 16   Wt 256 lb 9.6 oz (116.4 kg)   SpO2 94%   BMI 32.95 kg/m?   ?Wt Readings from Last 3 Encounters:  ?04/02/21 256 lb 9.6 oz (116.4 kg)  ?11/07/20 253 lb 3.2 oz (114.9 kg)  ?07/07/20 247 lb 3.2 oz (112.1 kg)  ? ? ?Physical Exam ? ?General appearance - alert, well appearing, older caucasian male and in no distress ?Mental status - normal mood, behavior, speech, dress, motor activity, and thought processes ?Mouth - mucous membranes moist, pharynx normal without lesions.  Most teeth in upper jaw are decayed.  Mild inflammation gum upper RT jaw ?Chest - clear to auscultation, no wheezes, rales or rhonchi, symmetric air entry ?Heart - normal rate, regular rhythm, normal S1, S2, no murmurs, rubs, clicks or gallops ?Extremities - peripheral pulses normal, no pedal edema, no clubbing or cyanosis ? ? ?  Latest Ref Rng & Units 11/07/2020  ?  9:15 AM 07/23/2019  ?  4:57 PM 02/21/2017  ? 11:54 AM  ?CMP  ?Glucose 70 - 99 mg/dL 118   87   94    ?BUN 6 - 24 mg/dL _0 ?Creatinine 0.76 - 1.27 mg/dL 1.12   1.20   1.08    ?Sodium 134 - 144 mmol/L 139   141   141    ?Potassium 3.5 - 5.2 mmol/L 4.5   5.1   5.2    ?Chloride 96 - 106 mmol/L 100   104   103    ?CO2 20 - 29 mmol/L _1 ?Calcium 8.7 - 10.2 mg/dL 9.6   9.6   9.6    ?Total Protein 6.0 - 8.5 g/dL 7.4   7.1   7.3    ?Total Bilirubin 0.0 - 1.2 mg/dL 0.3   0.2   0.3    ?Alkaline Phos 44 - 121 IU/L 72   72   65    ?AST 0 - 40 IU/L _2 ?ALT 0 -  44 IU/L 30   35   25    ? ?Lipid Panel  ?   ?Component Value Date/Time  ? CHOL 107 11/07/2020 0915  ?  TRIG 165 (H) 11/07/2020 0915  ? HDL 38 (L) 11/07/2020 0915  ? CHOLHDL 2.8 11/07/2020 0915  ? CHOLHDL 4.0 01/31/2016 1047  ? VLDL 33 (H) 01/31/2016 1047  ? [place] 41 11/07/2020 0915  ? ? ?CBC ?   ?Component Value Date/Time  ? WBC 12.7 (H) 11/07/2020 0915  ? WBC CANCELED 11/07/2020 0915  ? WBC 10.7 01/31/2016 1047  ? RBC 5.79 11/07/2020 0915  ? RBC 5.14 01/31/2016 1047  ? HGB 17.8 (H) 11/07/2020 0915  ? HCT 52.7 (H) 11/07/2020 0915  ? PLT 302 11/07/2020 0915  ? MCV 91 11/07/2020 0915  ? MCH 30.7 11/07/2020 0915  ? MCH 31.3 01/31/2016 1047  ? MCHC 33.8 11/07/2020 0915  ? MCHC 33.6 01/31/2016 1047  ? RDW 12.7 11/07/2020 0915  ? LYMPHSABS CANCELED 11/07/2020 0915  ? MONOABS 642 01/31/2016 1047  ? EOSABS CANCELED 11/07/2020 0915  ? BASOSABS CANCELED 11/07/2020 0915  ? ? ?ASSESSMENT AND PLAN: ?1. Essential hypertension ?Not at goal but close.  He has not taken medicines as yet for the morning. ?Continue Norvasc, hydrochlorothiazide and metoprolol. ? ?2. Tobacco dependence ?Strongly encouraged him to discontinue smoking.  He is aware of health risks associated with smoking.  He is not ready to quit but states he is cutting back. ? ?3. Mixed hyperlipidemia ?Continue Crestor.  Last LDL at goal. ? ?4. Dental caries ?Advised to see a dentist when he is able to afford. ?- amoxicillin (AMOXIL) 500 MG capsule; Take 1 capsule (500 mg total) by mouth 3 (three) times daily.  Dispense: 21 capsule; Refill: 0 ? ?5. Obesity (BMI 30.0-34.9) ?Discussed and encourage healthy eating habits and try to move as much as he can. ? ?6. Screening for colon cancer ?- Ambulatory referral to Gastroenterology ? ?7. History of colon polyps ?- Ambulatory referral to Gastroenterology ? ? ? ? ?Patient was given the opportunity to ask questions.  Patient verbalized understanding of the plan and was able to repeat key elements of the plan.  ? ?This documentation was completed using Radio producer.  Any transcriptional errors are  unintentional. ? ?No orders of the defined types were placed in this encounter. ? ? ? ?Requested Prescriptions  ? ? No prescriptions requested or ordered in this encounter  ? ? ?No follow-ups on file. ? ?Diego Cory

## 2021-04-13 ENCOUNTER — Other Ambulatory Visit: Payer: Self-pay

## 2021-04-13 ENCOUNTER — Other Ambulatory Visit: Payer: Self-pay | Admitting: Internal Medicine

## 2021-04-13 DIAGNOSIS — E782 Mixed hyperlipidemia: Secondary | ICD-10-CM

## 2021-04-15 MED ORDER — ROSUVASTATIN CALCIUM 20 MG PO TABS
ORAL_TABLET | Freq: Every day | ORAL | 3 refills | Status: DC
  Filled 2021-04-15 – 2021-04-30 (×2): qty 30, 30d supply, fill #0
  Filled 2021-05-24: qty 30, 30d supply, fill #1
  Filled 2021-07-12: qty 30, 30d supply, fill #2
  Filled 2021-09-17: qty 30, 30d supply, fill #3

## 2021-04-16 ENCOUNTER — Other Ambulatory Visit: Payer: Self-pay

## 2021-04-23 ENCOUNTER — Other Ambulatory Visit: Payer: Self-pay

## 2021-04-30 ENCOUNTER — Other Ambulatory Visit: Payer: Self-pay

## 2021-05-01 ENCOUNTER — Other Ambulatory Visit: Payer: Self-pay

## 2021-05-24 ENCOUNTER — Other Ambulatory Visit: Payer: Self-pay

## 2021-05-25 ENCOUNTER — Other Ambulatory Visit: Payer: Self-pay

## 2021-07-12 ENCOUNTER — Other Ambulatory Visit: Payer: Self-pay | Admitting: Internal Medicine

## 2021-07-12 ENCOUNTER — Other Ambulatory Visit: Payer: Self-pay

## 2021-07-12 DIAGNOSIS — I10 Essential (primary) hypertension: Secondary | ICD-10-CM

## 2021-07-12 MED ORDER — METOPROLOL SUCCINATE ER 50 MG PO TB24
ORAL_TABLET | Freq: Every day | ORAL | 0 refills | Status: DC
  Filled 2021-07-12: qty 30, 30d supply, fill #0

## 2021-07-12 MED ORDER — HYDROCHLOROTHIAZIDE 25 MG PO TABS
ORAL_TABLET | Freq: Every day | ORAL | 0 refills | Status: DC
  Filled 2021-07-12: qty 30, 30d supply, fill #0

## 2021-07-13 ENCOUNTER — Other Ambulatory Visit: Payer: Self-pay

## 2021-08-02 ENCOUNTER — Ambulatory Visit: Payer: Self-pay | Attending: Internal Medicine | Admitting: Internal Medicine

## 2021-08-02 ENCOUNTER — Other Ambulatory Visit: Payer: Self-pay

## 2021-08-02 VITALS — BP 126/86 | HR 66 | Ht 72.0 in | Wt 255.4 lb

## 2021-08-02 DIAGNOSIS — F17200 Nicotine dependence, unspecified, uncomplicated: Secondary | ICD-10-CM

## 2021-08-02 DIAGNOSIS — D751 Secondary polycythemia: Secondary | ICD-10-CM

## 2021-08-02 DIAGNOSIS — D72829 Elevated white blood cell count, unspecified: Secondary | ICD-10-CM

## 2021-08-02 DIAGNOSIS — H1013 Acute atopic conjunctivitis, bilateral: Secondary | ICD-10-CM

## 2021-08-02 DIAGNOSIS — E782 Mixed hyperlipidemia: Secondary | ICD-10-CM

## 2021-08-02 DIAGNOSIS — H538 Other visual disturbances: Secondary | ICD-10-CM

## 2021-08-02 DIAGNOSIS — I10 Essential (primary) hypertension: Secondary | ICD-10-CM

## 2021-08-02 DIAGNOSIS — F17210 Nicotine dependence, cigarettes, uncomplicated: Secondary | ICD-10-CM

## 2021-08-02 DIAGNOSIS — S50861A Insect bite (nonvenomous) of right forearm, initial encounter: Secondary | ICD-10-CM

## 2021-08-02 DIAGNOSIS — W57XXXA Bitten or stung by nonvenomous insect and other nonvenomous arthropods, initial encounter: Secondary | ICD-10-CM

## 2021-08-02 DIAGNOSIS — Z1211 Encounter for screening for malignant neoplasm of colon: Secondary | ICD-10-CM

## 2021-08-02 MED ORDER — AMITRIPTYLINE HCL 50 MG PO TABS
ORAL_TABLET | Freq: Every evening | ORAL | 1 refills | Status: DC | PRN
  Filled 2021-08-02: qty 30, 30d supply, fill #0
  Filled 2021-09-17: qty 90, 90d supply, fill #0
  Filled 2022-01-08: qty 30, 30d supply, fill #1
  Filled 2022-03-11: qty 30, 30d supply, fill #2
  Filled 2022-05-08: qty 30, 30d supply, fill #3

## 2021-08-02 MED ORDER — AMLODIPINE BESYLATE 10 MG PO TABS
10.0000 mg | ORAL_TABLET | Freq: Every day | ORAL | 3 refills | Status: DC
  Filled 2021-08-02: qty 30, 30d supply, fill #0
  Filled 2021-09-17: qty 90, 90d supply, fill #0
  Filled 2022-01-08: qty 30, 30d supply, fill #1
  Filled 2022-03-11: qty 30, 30d supply, fill #2
  Filled 2022-05-08: qty 30, 30d supply, fill #3
  Filled 2022-07-17 (×2): qty 30, 30d supply, fill #4

## 2021-08-02 MED ORDER — HYDROCHLOROTHIAZIDE 25 MG PO TABS
ORAL_TABLET | Freq: Every day | ORAL | 3 refills | Status: DC
  Filled 2021-08-02: qty 90, fill #0
  Filled 2021-09-17: qty 90, 90d supply, fill #0
  Filled 2022-01-08: qty 30, 30d supply, fill #1
  Filled 2022-03-11: qty 30, 30d supply, fill #2
  Filled 2022-05-08: qty 30, 30d supply, fill #3
  Filled 2022-07-17 (×2): qty 30, 30d supply, fill #4

## 2021-08-02 MED ORDER — OLOPATADINE HCL 0.1 % OP SOLN
1.0000 [drp] | Freq: Two times a day (BID) | OPHTHALMIC | 0 refills | Status: DC
  Filled 2021-08-02: qty 5, 30d supply, fill #0

## 2021-08-02 MED ORDER — NICOTINE 21 MG/24HR TD PT24
21.0000 mg | MEDICATED_PATCH | Freq: Every day | TRANSDERMAL | 0 refills | Status: DC
  Filled 2021-08-02: qty 28, 28d supply, fill #0

## 2021-08-02 MED ORDER — METOPROLOL SUCCINATE ER 50 MG PO TB24
ORAL_TABLET | Freq: Every day | ORAL | 3 refills | Status: DC
  Filled 2021-08-02: qty 90, fill #0
  Filled 2021-08-29: qty 90, 90d supply, fill #0
  Filled 2022-01-08: qty 30, 30d supply, fill #1
  Filled 2022-03-11: qty 30, 30d supply, fill #2
  Filled 2022-05-08: qty 30, 30d supply, fill #3
  Filled 2022-07-17 (×2): qty 30, 30d supply, fill #4

## 2021-08-02 NOTE — Progress Notes (Signed)
Pt wants lab to test for Lyme disease. Pt states that he was bite by tick 34moago. Pt also states he been have blurred vision.

## 2021-08-02 NOTE — Patient Instructions (Addendum)
Your blood pressure today is not at goal which is 130/80 or lower. Continue your current blood pressure medications and low-salt diet.  Please use the stool kit and return it to the laboratory the same day that it is use.  I sent a prescription to the pharmacy for allergy eyedrops for you to use as needed.  You should get in with an optometrist or an ophthalmologist for routine eye exam.

## 2021-08-02 NOTE — Progress Notes (Signed)
Patient ID: EULISES KIJOWSKI, male    DOB: [birth year]  MRN: [id number]  CC: Chronic disease management  Subjective: Dustin [name] is a 61 y.o. male who presents for chronic disease management His concerns today include:  Pt with hx of HTN, HL, OA, obesity, DD LS spine, false positive HIV testing 2018, tob dep    Plans to start working with his CW at [facility] to try to get applications for Medicaid and help with getting info from IRS to apply for [facility] in main time.  Also working on appeal for disability with a Chief Executive Officer.  Had 2 tick bites 5 wks ago after doing some yard work the day before.  One was on his right forearm and the other 1 was on the left lower back.  Removed with tweezers and use rubbing ETOH on it. Next day the areas were swollen and red.  Treated with rubbing ETOH and peroxide -days little his lower back pain increased and felt like his hips had locked up -Symptoms resolved after a few days -he was seen for his symptoms.   HTN: Patient is on Norvasc 10 mg daily, HCTZ 25 mg daily and metoprolol XL 50 mg daily. Took meds already for today Checks BP daily.  After tick bite BP was higher.  Now 130/80s-85 Limits salt in the foods. No chest pains or shortness of breath. No LE edema  Tobacco dependence: On last visit he was not ready to give a trial of quitting. Reports using Nicotine patches intermittently when he is unable to get cigarettes.  Smoking less than 1/2 pk/day. Request new rxn for nicotine patches  HL/obesity: Reports compliance with Crestor.  Last LDL was 45.  On last blood test, WBC and hemoglobin were both mildly elevated.  WBC was 12.7 with hemoglobin of 17.8.  Not sure if he snores.  Sleeps alone and on his side.  No morning HA.  Wakes up feling refresh in mornings.    C/O itchy, water, red eyes and some blurred vision.  Using his dad's old eye glasses but feels he needs any eye exam  HM: Referred to gastroenterology on last visit for colonoscopy.  Did  receive letter from them  but did not schedule because no insurance as yet.  Marland Kitchen  He agrees to do FIT test in mean time.   Due for shingles vaccine. Declines today  Patient Active Problem List   Diagnosis Date Noted   Obesity (BMI 30.0-34.9) 03/10/2020   Influenza vaccination declined 11/11/2019   Polyarthritis 11/11/2019   HLD (hyperlipidemia)    Chronic cough 07/23/2019   Tobacco dependence 07/23/2019   Chest pain in adult 07/23/2019   OSA (obstructive sleep apnea) 05/15/2016   Neck pain 03/05/2016   Low back pain 03/05/2016   DDD (degenerative disc disease), lumbosacral 01/31/2016   Acute meniscal tear of left knee 07/05/2014   Left knee pain 06/16/2014   Essential hypertension 06/16/2014   Other headache syndrome 02/17/2014   Chronic pain syndrome 02/17/2014   Medication overuse headache 05/06/2013   Dental caries 04/06/2013   Headache(784.0) 04/06/2013     Current Outpatient Medications on File Prior to Visit  Medication Sig Dispense Refill   acetaminophen-codeine (TYLENOL #3) 300-30 MG tablet Take 1-2 tablets by mouth every 8 (eight) hours as needed for moderate pain. 40 tablet 0   aspirin EC 81 MG tablet Take 1 tablet (81 mg total) by mouth daily. Swallow whole. 100 tablet 1   meloxicam (MOBIC) 15 MG tablet  TAKE 1 TABLET (15 MG TOTAL) BY MOUTH DAILY. 30 tablet 4   rosuvastatin (CRESTOR) 20 MG tablet TAKE 1 TABLET (20 MG TOTAL) BY MOUTH DAILY. 30 tablet 3   No current facility-administered medications on file prior to visit.    No Known Allergies  Social History   Socioeconomic History   Marital status: Legally Separated    Spouse name: Not on file   Number of children: Not on file   Years of education: Not on file   Highest education level: Not on file  Occupational History   Not on file  Tobacco Use   Smoking status: Every Day    Packs/day: 0.50    Types: Cigarettes   Smokeless tobacco: Never  Substance and Sexual Activity   Alcohol use: Yes     Alcohol/week: 0.0 standard drinks of alcohol    Comment: occasionally- "3 shots a month"   Drug use: No   Sexual activity: Not Currently    Partners: Female  Other Topics Concern   Not on file  Social History Narrative   Not on file   Social Determinants of Health   Financial Resource Strain: Not on file  Food Insecurity: Not on file  Transportation Needs: Not on file  Physical Activity: Not on file  Stress: Not on file  Social Connections: Not on file  Intimate Partner Violence: Not on file    Family History  Problem Relation Age of Onset   Rheum arthritis Mother    Peripheral Artery Disease Mother    Cancer Father    Diabetes Father     Past Surgical History:  Procedure Laterality Date   ANTERIOR CRUCIATE LIGAMENT REPAIR     NECK SURGERY      ROS: Review of Systems Negative except as stated above  PHYSICAL EXAM: BP 126/86   Pulse 66   Ht 6' (1.829 m)   Wt 255 lb 6.4 oz (115.8 kg)   SpO2 96%   BMI 34.64 kg/m   Wt Readings from Last 3 Encounters:  08/02/21 255 lb 6.4 oz (115.8 kg)  04/02/21 256 lb 9.6 oz (116.4 kg)  11/07/20 253 lb 3.2 oz (114.9 kg)  Repeat blood pressure 140/90  Physical Exam   General appearance - alert, well appearing, and in no distress Mental status - normal mood, behavior, speech, dress, motor activity, and thought processes Eyes -no conjunctival injection.  No edema of the eyelids. Neck - supple, no significant adenopathy Chest - clear to auscultation, no wheezes, rales or rhonchi, symmetric air entry Heart - normal rate, regular rhythm, normal S1, S2, no murmurs, rubs, clicks or gallops Extremities - peripheral pulses normal, no pedal edema, no clubbing or cyanosis Skin -small red spot noted in the lower back left side paraspinal muscles laterally.  Patient states this was from the tick bite 5 weeks ago.     Latest Ref Rng & Units 11/07/2020    9:15 AM 07/23/2019    4:57 PM 02/21/2017   11:54 AM  CMP  Glucose 70 - 99 mg/dL  118  87  94   BUN 6 - 24 mg/dL '13  11  15   '$ Creatinine 0.76 - 1.27 mg/dL 1.12  1.20  1.08   Sodium 134 - 144 mmol/L 139  141  141   Potassium 3.5 - 5.2 mmol/L 4.5  5.1  5.2   Chloride 96 - 106 mmol/L 100  104  103   CO2 20 - 29 mmol/L 23  25  22  Calcium 8.7 - 10.2 mg/dL 9.6  9.6  9.6   Total Protein 6.0 - 8.5 g/dL 7.4  7.1  7.3   Total Bilirubin 0.0 - 1.2 mg/dL 0.3  0.2  0.3   Alkaline Phos 44 - 121 IU/L 72  72  65   AST 0 - 40 IU/L '23  26  23   '$ ALT 0 - 44 IU/L 30  35  25    Lipid Panel     Component Value Date/Time   CHOL 107 11/07/2020 0915   TRIG 165 (H) 11/07/2020 0915   HDL 38 (L) 11/07/2020 0915   CHOLHDL 2.8 11/07/2020 0915   CHOLHDL 4.0 01/31/2016 1047   VLDL 33 (H) 01/31/2016 1047   LDLCALC 41 11/07/2020 0915    CBC    Component Value Date/Time   WBC 12.7 (H) 11/07/2020 0915   WBC CANCELED 11/07/2020 0915   WBC 10.7 01/31/2016 1047   RBC 5.79 11/07/2020 0915   RBC 5.14 01/31/2016 1047   HGB 17.8 (H) 11/07/2020 0915   HCT 52.7 (H) 11/07/2020 0915   PLT 302 11/07/2020 0915   MCV 91 11/07/2020 0915   MCH 30.7 11/07/2020 0915   MCH 31.3 01/31/2016 1047   MCHC 33.8 11/07/2020 0915   MCHC 33.6 01/31/2016 1047   RDW 12.7 11/07/2020 0915   LYMPHSABS CANCELED 11/07/2020 0915   MONOABS 642 01/31/2016 1047   EOSABS CANCELED 11/07/2020 0915   BASOSABS CANCELED 11/07/2020 0915    ASSESSMENT AND PLAN:  1. Essential hypertension Diastolic blood pressure not at goal.  Goal is 130/80 or lower.  We discussed adding another blood pressure medication but patient is not in favor of this partially for financial reasons and also because he does not want to be on another medication.  He will continue to monitor his blood pressure at home. - amLODipine (NORVASC) 10 MG tablet; Take 1 tablet (10 mg total) by mouth daily.  Dispense: 90 tablet; Refill: 3 - hydrochlorothiazide (HYDRODIURIL) 25 MG tablet; TAKE 1 TABLET (25 MG TOTAL) BY MOUTH DAILY.  Dispense: 90 tablet; Refill: 3 -  metoprolol succinate (TOPROL-XL) 50 MG 24 hr tablet; TAKE 1 TABLET (50 MG TOTAL) BY MOUTH DAILY.  Dispense: 90 tablet; Refill: 3  2. Mixed hyperlipidemia Continue Crestor.  Last LDL was at goal at 45.  3. Tobacco dependence Continue to encourage him to quit.  He is aware of health risks associated with smoking. - nicotine (NICODERM CQ - DOSED IN MG/24 HOURS) 21 mg/24hr patch; Place 1 patch (21 mg total) onto the skin daily.  Dispense: 28 patch; Refill: 0  4. Tick bite of right forearm, initial encounter Patient would like to be checked for Lyme disease.  We will check a Lyme serology.  Advised that in the future if he has a tick bite and develops redness and swelling at the site, he should be seen to be evaluated for possible antibiotic treatment - Lyme Disease Serology w/Reflex  5. Allergic conjunctivitis of both eyes Eyes look okay today but based on his symptoms, I have prescribed some Patanol for him to use as needed - olopatadine (PATANOL) 0.1 % ophthalmic solution; Place 1 drop into both eyes 2 (two) times daily.  Dispense: 5 mL; Refill: 0  6. Blurred vision, bilateral Advised getting regular eye exam at least once every 2 years.  He is currently uninsured.  Advised of places where he can go to get an eye exam at a reasonable cost.  7. Polycythemia Likely due to  cigarette smoking.  Encouraged him to quit. - CBC With Diff/Platelet  8. Leukocytosis, unspecified type - CBC With Diff/Platelet  9. Screening for colon cancer Patient agreeable to doing the fit test today since he is unable to afford a colonoscopy at this time. - Fecal occult blood, imunochemical([facility])   Patient was given the opportunity to ask questions.  Patient verbalized understanding of the plan and was able to repeat key elements of the plan.   This documentation was completed using Radio producer.  Any transcriptional errors are unintentional.  Orders Placed This Encounter   Procedures   Fecal occult blood, imunochemical([facility])   Lyme Disease Serology w/Reflex   CBC With Diff/Platelet     Requested Prescriptions   Signed Prescriptions Disp Refills   olopatadine (PATANOL) 0.1 % ophthalmic solution 5 mL 0    Sig: Place 1 drop into both eyes 2 (two) times daily.   amitriptyline (ELAVIL) 50 MG tablet 90 tablet 1    Sig: TAKE 1 TABLET (50 MG TOTAL) BY MOUTH AT BEDTIME AS NEEDED. FOR SLEEP   amLODipine (NORVASC) 10 MG tablet 90 tablet 3    Sig: Take 1 tablet (10 mg total) by mouth daily.   hydrochlorothiazide (HYDRODIURIL) 25 MG tablet 90 tablet 3    Sig: TAKE 1 TABLET (25 MG TOTAL) BY MOUTH DAILY.   metoprolol succinate (TOPROL-XL) 50 MG 24 hr tablet 90 tablet 3    Sig: TAKE 1 TABLET (50 MG TOTAL) BY MOUTH DAILY.   nicotine (NICODERM CQ - DOSED IN MG/24 HOURS) 21 mg/24hr patch 28 patch 0    Sig: Place 1 patch (21 mg total) onto the skin daily.    Return in about 4 months (around 12/03/2021).  Karle Plumber, MD, FACP

## 2021-08-03 LAB — CBC WITH DIFF/PLATELET
Basophils Absolute: 0.1 10*3/uL (ref 0.0–0.2)
Basos: 1 %
EOS (ABSOLUTE): 0.2 10*3/uL (ref 0.0–0.4)
Eos: 3 %
Hematocrit: 51.2 % — ABNORMAL HIGH (ref 37.5–51.0)
Hemoglobin: 17.5 g/dL (ref 13.0–17.7)
Immature Grans (Abs): 0 10*3/uL (ref 0.0–0.1)
Immature Granulocytes: 0 %
Lymphocytes Absolute: 2.6 10*3/uL (ref 0.7–3.1)
Lymphs: 31 %
MCH: 30.8 pg (ref 26.6–33.0)
MCHC: 34.2 g/dL (ref 31.5–35.7)
MCV: 90 fL (ref 79–97)
Monocytes Absolute: 0.6 10*3/uL (ref 0.1–0.9)
Monocytes: 8 %
Neutrophils Absolute: 4.7 10*3/uL (ref 1.4–7.0)
Neutrophils: 57 %
Platelets: 230 10*3/uL (ref 150–450)
RBC: 5.68 x10E6/uL (ref 4.14–5.80)
RDW: 12.9 % (ref 11.6–15.4)
WBC: 8.3 10*3/uL (ref 3.4–10.8)

## 2021-08-03 LAB — LYME DISEASE SEROLOGY W/REFLEX: Lyme Total Antibody EIA: NEGATIVE

## 2021-08-08 LAB — FECAL OCCULT BLOOD, IMMUNOCHEMICAL: Fecal Occult Bld: NEGATIVE

## 2021-08-09 ENCOUNTER — Other Ambulatory Visit: Payer: Self-pay

## 2021-08-13 ENCOUNTER — Ambulatory Visit: Payer: Self-pay

## 2021-08-13 NOTE — Telephone Encounter (Signed)
Third message left for pt, pt wanting rx for teeth and gum infection states in his message that Dr. Wynetta Emery calls it in for him because she has seen him for it before. No answer x 3, will send to office. If pt calls again will update.

## 2021-08-13 NOTE — Telephone Encounter (Signed)
Summary: pt states infection in gums and teeth and was told to call Dr Lenna Sciara for meds   Pt states having a bad infection with his teeth and gums stating needs antibiotic etc. Pt states that Dr Wynetta Emery is well aware of all his rotten teeth and gum issues and always prescribes him medications and is to call when having issues and this time it is bad. FU at [phone]. His landline is not working again call only [phone]       Called pt - LMOM to call back.

## 2021-08-13 NOTE — Telephone Encounter (Signed)
Summary: pt states infection in gums and teeth and was told to call Dr Lenna Sciara for meds   Pt states having a bad infection with his teeth and gums stating needs antibiotic etc. Pt states that Dr Wynetta Emery is well aware of all his rotten teeth and gum issues and always prescribes him medications and is to call when having issues and this time it is bad. FU at [phone]. His landline is not working again call only [phone]      Called pt - [name]

## 2021-08-13 NOTE — Telephone Encounter (Signed)
  Chief Complaint: teeth and gums sore swollen Symptoms: he says he gets infection a couple times a year Frequency: started 3 days ago Pertinent Negatives: Patient denies fever Disposition: '[]'$ ED /'[]'$ Urgent Care (no appt availability in office) / '[x]'$ Appointment(In office/virtual)/ '[]'$  [facility] [facility]/ '[]'$ Home Care/ '[]'$ Refused Recommended Disposition /'[]'$ [place] [facility]/ '[]'$  Follow-up with PCP Additional Notes: Pt refused appt due to just saw Dr. Wynetta Emery and she asked if he needed meds for it. He told her it was not bothering him but now it is. He states if she wants him to come back he will but did not want to scheduled appt until she was asked to call something in.   Reason for Disposition  [1] SEVERE mouth pain (e.g., excruciating) AND [2] not improved after 2 hours of pain medicine  Answer Assessment - Initial Assessment Questions 1. ONSET: "When did the mouth start hurting?" (e.g., hours or days ago)      Teeth and gums hurting 4 days ago 2. SEVERITY: "How bad is the pain?" (Scale 1-10; mild, moderate or severe)   - MILD (1-3):  doesn't interfere with eating or normal activities   - MODERATE (4-7): interferes with eating some solids and normal activities   - SEVERE (8-10):  excruciating pain, interferes with most normal activities   - SEVERE DYSPHAGIA: can't swallow liquids, drooling     10 3. SORES: "Are there any sores or ulcers in the mouth?" If Yes, ask: "What part of the mouth are the sores in?"     Has gingivitis, gum deteriorating 4. FEVER: "Do you have a fever?" If Yes, ask: "What is your temperature, how was it measured, and when did it start?"     no 5. CAUSE: "What do you think is causing the mouth pain?"     Gingivitis and poor teeth 6. OTHER SYMPTOMS: "Do you have any other symptoms?" (e.g., difficulty breathing)     Headache, usually has with the tooth and gum situation.  Protocols used: Mouth Pain-A-AH

## 2021-08-14 ENCOUNTER — Other Ambulatory Visit: Payer: Self-pay

## 2021-08-14 MED ORDER — AMOXICILLIN 500 MG PO CAPS
500.0000 mg | ORAL_CAPSULE | Freq: Three times a day (TID) | ORAL | 0 refills | Status: AC
  Filled 2021-08-14: qty 21, 7d supply, fill #0

## 2021-08-14 NOTE — Addendum Note (Signed)
Addended by: Karle Plumber B on: 08/14/2021 06:17 AM   Modules accepted: Orders

## 2021-08-14 NOTE — Telephone Encounter (Signed)
Pt notified Rx sent to pharmacy. Pt expressed understanding. -----DD,RMA

## 2021-08-29 ENCOUNTER — Other Ambulatory Visit: Payer: Self-pay

## 2021-09-17 ENCOUNTER — Other Ambulatory Visit: Payer: Self-pay | Admitting: Internal Medicine

## 2021-09-17 ENCOUNTER — Other Ambulatory Visit: Payer: Self-pay

## 2021-09-17 DIAGNOSIS — M1731 Unilateral post-traumatic osteoarthritis, right knee: Secondary | ICD-10-CM

## 2021-09-18 ENCOUNTER — Other Ambulatory Visit: Payer: Self-pay

## 2021-09-18 MED ORDER — MELOXICAM 15 MG PO TABS
15.0000 mg | ORAL_TABLET | Freq: Every day | ORAL | 4 refills | Status: DC
  Filled 2021-09-18: qty 30, 30d supply, fill #0

## 2021-09-18 NOTE — Telephone Encounter (Signed)
Requested Prescriptions  Pending Prescriptions Disp Refills  . meloxicam (MOBIC) 15 MG tablet 30 tablet 4    Sig: TAKE 1 TABLET (15 MG TOTAL) BY MOUTH DAILY.     Analgesics:  COX2 Inhibitors Failed - 09/17/2021 10:03 AM      Failed - Manual Review: Labs are only required if the patient has taken medication for more than 8 weeks.      Failed - HCT in normal range and within 360 days    Hematocrit  Date Value Ref Range Status  08/02/2021 51.2 (H) 37.5 - 51.0 % Final         Passed - HGB in normal range and within 360 days    Hemoglobin  Date Value Ref Range Status  08/02/2021 17.5 13.0 - 17.7 g/dL Final         Passed - Cr in normal range and within 360 days    Creatinine  Date Value Ref Range Status  02/21/2017 182.0 20.0 - 300.0 mg/dL Final   Creat  Date Value Ref Range Status  01/31/2016 1.18 0.70 - 1.33 mg/dL Final    Comment:      For patients > or = 59 years of age: The upper reference limit for Creatinine is approximately 13% higher for people identified as African-American.      Creatinine, Ser  Date Value Ref Range Status  11/07/2020 1.12 0.76 - 1.27 mg/dL Final         Passed - AST in normal range and within 360 days    AST  Date Value Ref Range Status  11/07/2020 23 0 - 40 IU/L Final         Passed - ALT in normal range and within 360 days    ALT  Date Value Ref Range Status  11/07/2020 30 0 - 44 IU/L Final         Passed - eGFR is 30 or above and within 360 days    GFR, Est African American  Date Value Ref Range Status  01/31/2016 81 >=60 mL/min Final   GFR calc Af Amer  Date Value Ref Range Status  07/23/2019 78 >59 mL/min/1.73 Final    Comment:    **[facility] currently reports eGFR in compliance with the current**   recommendations of the [employer]. [facility] will   update reporting as new guidelines are published from the NKF-ASN   Task force.    GFR, Est Non African American  Date Value Ref Range Status  01/31/2016 70  >=60 mL/min Final   GFR calc non Af Amer  Date Value Ref Range Status  07/23/2019 67 >59 mL/min/1.73 Final   eGFR  Date Value Ref Range Status  11/07/2020 76 >59 mL/min/1.73 Final         Passed - Patient is not pregnant      Passed - Valid encounter within last 12 months    Recent Outpatient Visits          1 month ago Essential hypertension   [place], [place]   5 months ago Essential hypertension   [place] Ladell Pier, MD   10 months ago Essential hypertension   [place] Ladell Pier, MD   1 year ago Essential hypertension   [place] Ladell Pier, MD   1 year ago Essential hypertension   [place]  Ladell Pier, MD      Future Appointments            In 2 months Wynetta Emery Dalbert Batman, MD [place]

## 2021-09-20 ENCOUNTER — Emergency Department (HOSPITAL_COMMUNITY): Payer: Medicaid Other

## 2021-09-20 ENCOUNTER — Encounter (HOSPITAL_COMMUNITY): Payer: Self-pay

## 2021-09-20 ENCOUNTER — Other Ambulatory Visit: Payer: Self-pay

## 2021-09-20 ENCOUNTER — Emergency Department (HOSPITAL_COMMUNITY)
Admission: EM | Admit: 2021-09-20 | Discharge: 2021-09-21 | Disposition: A | Discharge status: Home / Self Care | Payer: Medicaid Other | Attending: Emergency Medicine | Admitting: Emergency Medicine

## 2021-09-20 DIAGNOSIS — M7612 Psoas tendinitis, left hip: Secondary | ICD-10-CM | POA: Diagnosis not present

## 2021-09-20 DIAGNOSIS — M25552 Pain in left hip: Secondary | ICD-10-CM | POA: Diagnosis present

## 2021-09-20 DIAGNOSIS — M76892 Other specified enthesopathies of left lower limb, excluding foot: Secondary | ICD-10-CM

## 2021-09-20 DIAGNOSIS — Z7982 Long term (current) use of aspirin: Secondary | ICD-10-CM | POA: Diagnosis not present

## 2021-09-20 NOTE — ED Triage Notes (Signed)
AMBULATORY to ED with c/o L hip pain. States it feels like someone is stabbing him with an ice pick. States this has been happening intermittent to both hips over the last year. Hx of arthritis.

## 2021-09-21 ENCOUNTER — Other Ambulatory Visit: Payer: Self-pay

## 2021-09-21 MED ORDER — DEXAMETHASONE SODIUM PHOSPHATE 10 MG/ML IJ SOLN
10.0000 mg | Freq: Once | INTRAMUSCULAR | Status: AC
  Administered 2021-09-21: 10 mg via INTRAMUSCULAR
  Filled 2021-09-21: qty 1

## 2021-09-21 MED ORDER — OXYCODONE-ACETAMINOPHEN 5-325 MG PO TABS
2.0000 | ORAL_TABLET | Freq: Once | ORAL | Status: AC
  Administered 2021-09-21: 2 via ORAL
  Filled 2021-09-21: qty 2

## 2021-09-21 NOTE — ED Provider Notes (Signed)
[place] DEPT Provider Note   CSN: [id number] Arrival date & time: 09/20/21  1818     History  Chief Complaint  Patient presents with   Hip Pain    Dustin [name] is a 61 y.o. male.  Patient presents to the emergency department for evaluation of hip pain.  Patient reports that he has been having problems with both of his hips for some time.  The pain will come on and usually last for a few days.  He has been unable to get an appointment with his doctor when the symptoms are present.  Patient reports that today he is having severe pain.  Pain is generally in the front part of the hip where the muscles attach to the pelvis.  He reports that when he tries to raise the leg it feels like the muscles are tearing off of his pelvis.  This is causing him to have trouble walking, having to track his leg because of the pain.       Home Medications Prior to Admission medications   Medication Sig Start Date End Date Taking? Authorizing Provider  acetaminophen (TYLENOL) 500 MG tablet Take 1,000 mg by mouth every 6 (six) hours as needed for moderate pain.   Yes [provider]  acetaminophen (TYLENOL) 650 MG CR tablet Take 1,300 mg by mouth every 8 (eight) hours as needed for pain.   Yes [provider]  amitriptyline (ELAVIL) 50 MG tablet TAKE 1 TABLET (50 MG TOTAL) BY MOUTH AT BEDTIME AS NEEDED. FOR SLEEP Patient taking differently: Take 50 mg by mouth at bedtime. 08/02/21  Yes Ladell Pier, MD  amLODipine (NORVASC) 10 MG tablet Take 1 tablet (10 mg total) by mouth daily. 08/02/21  Yes Ladell Pier, MD  aspirin EC 81 MG tablet Take 1 tablet (81 mg total) by mouth daily. Swallow whole. 07/23/19  Yes Ladell Pier, MD  hydrochlorothiazide (HYDRODIURIL) 25 MG tablet TAKE 1 TABLET (25 MG TOTAL) BY MOUTH DAILY. 08/02/21  Yes Ladell Pier, MD  meloxicam (MOBIC) 15 MG tablet Take 1 tablet (15 mg total) by mouth daily. 09/18/21  Yes  Ladell Pier, MD  metoprolol succinate (TOPROL-XL) 50 MG 24 hr tablet TAKE 1 TABLET (50 MG TOTAL) BY MOUTH DAILY. 08/02/21  Yes Ladell Pier, MD  nicotine (NICODERM CQ - DOSED IN MG/24 HOURS) 21 mg/24hr patch Place 1 patch (21 mg total) onto the skin daily. 08/02/21  Yes Ladell Pier, MD  olopatadine (PATANOL) 0.1 % ophthalmic solution Place 1 drop into both eyes 2 (two) times daily. Patient taking differently: Place 1 drop into both eyes 2 (two) times daily as needed for allergies. 08/02/21  Yes Ladell Pier, MD  rosuvastatin (CRESTOR) 20 MG tablet TAKE 1 TABLET (20 MG TOTAL) BY MOUTH DAILY. 04/15/21 04/15/22 Yes Ladell Pier, MD  acetaminophen-codeine (TYLENOL #3) 300-30 MG tablet Take 1-2 tablets by mouth every 8 (eight) hours as needed for moderate pain. Patient not taking: Reported on 09/21/2021 11/07/20   Ladell Pier, MD      Allergies    Patient has no known allergies.    Review of Systems   Review of Systems  Physical Exam Updated Vital Signs BP 134/79   Pulse 72   Temp 97.6 F (36.4 C) (Oral)   Resp 18   Ht 6' (1.829 m)   Wt 113.4 kg   SpO2 94%   BMI 33.91 kg/m  Physical Exam Vitals and  nursing note reviewed.  Constitutional:      General: He is not in acute distress.    Appearance: He is well-developed.  HENT:     Head: Normocephalic and atraumatic.     Mouth/Throat:     Mouth: Mucous membranes are moist.  Eyes:     General: Vision grossly intact. Gaze aligned appropriately.     Extraocular Movements: Extraocular movements intact.     Conjunctiva/sclera: Conjunctivae normal.  Cardiovascular:     Rate and Rhythm: Normal rate and regular rhythm.     Pulses: Normal pulses.     Heart sounds: Normal heart sounds, S1 normal and S2 normal. No murmur heard.    No friction rub. No gallop.  Pulmonary:     Effort: Pulmonary effort is normal. No respiratory distress.     Breath sounds: Normal breath sounds.  Abdominal:     Palpations:  Abdomen is soft.     Tenderness: There is no abdominal tenderness. There is no guarding or rebound.     Hernia: No hernia is present.  Musculoskeletal:        General: No swelling.     Cervical back: Full passive range of motion without pain, normal range of motion and neck supple. No pain with movement, spinous process tenderness or muscular tenderness. Normal range of motion.     Left hip: Tenderness present. No deformity. Decreased range of motion (Painful inhibition).     Right lower leg: No edema.     Left lower leg: No edema.       Legs:  Skin:    General: Skin is warm and dry.     Capillary Refill: Capillary refill takes less than 2 seconds.     Findings: No ecchymosis, erythema, lesion or wound.  Neurological:     Mental Status: He is alert and oriented to person, place, and time.     GCS: GCS eye subscore is 4. GCS verbal subscore is 5. GCS motor subscore is 6.     Cranial Nerves: Cranial nerves 2-12 are intact.     Sensory: Sensation is intact.     Motor: Motor function is intact. No weakness or abnormal muscle tone.     Coordination: Coordination is intact.  Psychiatric:        Mood and Affect: Mood normal.        Speech: Speech normal.        Behavior: Behavior normal.     ED Results / Procedures / Treatments   Labs (all labs ordered are listed, but only abnormal results are displayed) Labs Reviewed - No data to display  EKG None  Radiology DG Hip Unilat With Pelvis 2-3 Views Left  Result Date: 09/20/2021 CLINICAL DATA:  Pain, evaluate for dislocation. EXAM: DG HIP (WITH OR WITHOUT PELVIS) 2-3V LEFT COMPARISON:  None Available. FINDINGS: There is no evidence of hip fracture or dislocation. There is no evidence of arthropathy or other focal bone abnormality. IMPRESSION: Negative. Electronically Signed   By: Ronney Asters M.D.   On: 09/20/2021 20:39    Procedures Procedures    Medications Ordered in ED Medications  dexamethasone (DECADRON) injection 10 mg  (has no administration in time range)  oxyCODONE-acetaminophen (PERCOCET/ROXICET) 5-325 MG per tablet 2 tablet (has no administration in time range)    ED Course/ Medical Decision Making/ A&P                           Medical  Decision Making Amount and/or Complexity of Data Reviewed Radiology: ordered.   Presents to the emergency department for evaluation of groin pain with movement of the leg.  Patient has tenderness in this area, in the area of tendon insertions at the proximal quadricep.  Patient having difficulty flexing at the hip because of painful inhibition.  X-ray of hip without significant degenerative changes.  Strength distal to the hip preserved, normal sensation.  Presentation consistent with soft tissue inflammation, likely tendinitis.        Final Clinical Impression(s) / ED Diagnoses Final diagnoses:  Tendinitis of left hip flexor    Rx / DC Orders ED Discharge Orders     None         Alya Smaltz, Gwenyth Allegra, MD 09/21/21 0028

## 2021-09-24 ENCOUNTER — Other Ambulatory Visit: Payer: Self-pay

## 2021-09-28 ENCOUNTER — Other Ambulatory Visit: Payer: Self-pay

## 2021-10-25 ENCOUNTER — Other Ambulatory Visit: Payer: Self-pay

## 2021-10-25 ENCOUNTER — Ambulatory Visit: Payer: Self-pay | Attending: Internal Medicine | Admitting: Internal Medicine

## 2021-10-25 ENCOUNTER — Encounter: Payer: Self-pay | Admitting: Internal Medicine

## 2021-10-25 VITALS — BP 142/87 | HR 83 | Temp 98.0°F | Ht 72.0 in | Wt 253.4 lb

## 2021-10-25 DIAGNOSIS — M25551 Pain in right hip: Secondary | ICD-10-CM

## 2021-10-25 DIAGNOSIS — E782 Mixed hyperlipidemia: Secondary | ICD-10-CM

## 2021-10-25 DIAGNOSIS — K1379 Other lesions of oral mucosa: Secondary | ICD-10-CM

## 2021-10-25 DIAGNOSIS — M25552 Pain in left hip: Secondary | ICD-10-CM

## 2021-10-25 DIAGNOSIS — M1731 Unilateral post-traumatic osteoarthritis, right knee: Secondary | ICD-10-CM

## 2021-10-25 MED ORDER — ACETAMINOPHEN-CODEINE 300-30 MG PO TABS
2.0000 | ORAL_TABLET | Freq: Two times a day (BID) | ORAL | 0 refills | Status: DC | PRN
  Filled 2021-10-25: qty 20, 5d supply, fill #0

## 2021-10-25 MED ORDER — ROSUVASTATIN CALCIUM 20 MG PO TABS
20.0000 mg | ORAL_TABLET | Freq: Every day | ORAL | 3 refills | Status: DC
  Filled 2021-10-25: qty 30, 30d supply, fill #0
  Filled 2022-01-08: qty 30, 30d supply, fill #1
  Filled 2022-03-11: qty 30, 30d supply, fill #2
  Filled 2022-05-08: qty 30, 30d supply, fill #3

## 2021-10-25 MED ORDER — MELOXICAM 15 MG PO TABS
15.0000 mg | ORAL_TABLET | Freq: Every day | ORAL | 4 refills | Status: DC
  Filled 2021-10-25: qty 30, 30d supply, fill #0

## 2021-10-25 MED ORDER — GABAPENTIN 300 MG PO CAPS
300.0000 mg | ORAL_CAPSULE | Freq: Every day | ORAL | 1 refills | Status: DC
  Filled 2021-10-25: qty 30, 30d supply, fill #0

## 2021-10-25 NOTE — Progress Notes (Signed)
Patient ID: Dustin [name], male    DOB: 07/27/1960  MRN: [id number]  CC: Hospitalization Follow-up (Pt states he has had trouble with both hips before ED visit. Pt states he could not lift his left leg and right knee is causing pt pain while within motion. )   Subjective: Dustin [name] is a 61 y.o. male who presents for ER f/u His concerns today include:  Pt with hx of HTN, HL, OA, obesity, DD LS spine, false positive HIV testing 2018, tob dep   Seen in the emergency room 09/21/2021 with complaints of bilateral hip pain with the left being worse than the right.  X-ray of the left hip was negative.  Diagnosed with tendinitis of the left hip.  Given dexamethasone and 2 tablets of oxycodone/Tylenol.  Told to follow-up with [place].    Today: He presents ambulating with a walker.  States his arthritis pain in hips, knees and ankles have been bad over the past several weeks.  Hips are most bothersome left much greater than right.  States that when he tries to lift the left leg to take a step forward it feels like the muscles that attach the leg to the pelvis a tearing.  He has not had any falls.  Out of meloxicam for some time due to transportation issues and getting to the pharmacy.  Just got his car back into running condition this week.  Plans to pick up meloxicam from the pharmacy.  Requesting short course of Tylenol with codeine.  Complains of pain in the left side of the mouth involving the posterior lower gum and the inside of the cheek.  States it is different from the pain that he has when the tooth is infected.    "Feels like a 1000 yellow jacket stinging" the jawbone on the inside of the cheek. Plans to apply for [place] when he gets 2 letters of support that he is waiting to recceive  Request refill on Crestor. Patient Active Problem List   Diagnosis Date Noted   Obesity (BMI 30.0-34.9) 03/10/2020   Influenza vaccination declined 11/11/2019   Polyarthritis 11/11/2019   HLD  (hyperlipidemia)    Chronic cough 07/23/2019   Tobacco dependence 07/23/2019   Chest pain in adult 07/23/2019   OSA (obstructive sleep apnea) 05/15/2016   Neck pain 03/05/2016   Low back pain 03/05/2016   DDD (degenerative disc disease), lumbosacral 01/31/2016   Acute meniscal tear of left knee 07/05/2014   Left knee pain 06/16/2014   Essential hypertension 06/16/2014   Other headache syndrome 02/17/2014   Chronic pain syndrome 02/17/2014   Medication overuse headache 05/06/2013   Dental caries 04/06/2013   Headache(784.0) 04/06/2013     Current Outpatient Medications on File Prior to Visit  Medication Sig Dispense Refill   acetaminophen (TYLENOL) 500 MG tablet Take 1,000 mg by mouth every 6 (six) hours as needed for moderate pain.     acetaminophen (TYLENOL) 650 MG CR tablet Take 1,300 mg by mouth every 8 (eight) hours as needed for pain.     amitriptyline (ELAVIL) 50 MG tablet TAKE 1 TABLET (50 MG TOTAL) BY MOUTH AT BEDTIME AS NEEDED. FOR SLEEP (Patient taking differently: Take 50 mg by mouth at bedtime.) 90 tablet 1   amLODipine (NORVASC) 10 MG tablet Take 1 tablet (10 mg total) by mouth daily. 90 tablet 3   aspirin EC 81 MG tablet Take 1 tablet (81 mg total) by mouth daily. Swallow whole. 100 tablet 1  hydrochlorothiazide (HYDRODIURIL) 25 MG tablet TAKE 1 TABLET (25 MG TOTAL) BY MOUTH DAILY. 90 tablet 3   metoprolol succinate (TOPROL-XL) 50 MG 24 hr tablet TAKE 1 TABLET (50 MG TOTAL) BY MOUTH DAILY. 90 tablet 3   nicotine (NICODERM CQ - DOSED IN MG/24 HOURS) 21 mg/24hr patch Place 1 patch (21 mg total) onto the skin daily. 28 patch 0   rosuvastatin (CRESTOR) 20 MG tablet TAKE 1 TABLET (20 MG TOTAL) BY MOUTH DAILY. 30 tablet 3   acetaminophen-codeine (TYLENOL #3) 300-30 MG tablet Take 1-2 tablets by mouth every 8 (eight) hours as needed for moderate pain. (Patient not taking: Reported on 10/25/2021) 40 tablet 0   meloxicam (MOBIC) 15 MG tablet Take 1 tablet (15 mg total) by mouth  daily. (Patient not taking: Reported on 10/25/2021) 30 tablet 4   olopatadine (PATANOL) 0.1 % ophthalmic solution Place 1 drop into both eyes 2 (two) times daily. (Patient not taking: Reported on 10/25/2021) 5 mL 0   No current facility-administered medications on file prior to visit.    No Known Allergies  Social History   Socioeconomic History   Marital status: Legally Separated    Spouse name: Not on file   Number of children: Not on file   Years of education: Not on file   Highest education level: Not on file  Occupational History   Not on file  Tobacco Use   Smoking status: Every Day    Packs/day: 0.50    Types: Cigarettes   Smokeless tobacco: Never  Vaping Use   Vaping Use: Never used  Substance and Sexual Activity   Alcohol use: Yes    Alcohol/week: 0.0 standard drinks of alcohol    Comment: occasionally- "3 shots a month"   Drug use: No   Sexual activity: Not Currently    Partners: Female  Other Topics Concern   Not on file  Social History Narrative   Not on file   Social Determinants of Health   Financial Resource Strain: Not on file  Food Insecurity: Not on file  Transportation Needs: Not on file  Physical Activity: Not on file  Stress: Not on file  Social Connections: Not on file  Intimate Partner Violence: Not on file    Family History  Problem Relation Age of Onset   Rheum arthritis Mother    Peripheral Artery Disease Mother    Cancer Father    Diabetes Father     Past Surgical History:  Procedure Laterality Date   ANTERIOR CRUCIATE LIGAMENT REPAIR     NECK SURGERY      ROS: Review of Systems Negative except as stated above  PHYSICAL EXAM: BP (!) 142/87   Pulse 83   Temp 98 F (36.7 C) (Temporal)   Ht 6' (1.829 m)   Wt 253 lb 6.4 oz (114.9 kg)   SpO2 98%   BMI 34.37 kg/m   Physical Exam  General appearance - alert, older Caucasian male well appearing, and in no distress Mental status -patient is very talkative. Mouth -he has  several teeth remaining in the upper jaw all of which are decayed.  In the left lower jaw, all of his molars have been extracted or have fallen out.  The gum does not appear inflamed or infected.  Gum in the upper jaw on the left side does not appear inflamed or infected.  No lesions seen on the buccal mucosa. Musculoskeletal -patient ambulating with a walker today. Knees: No edema or erythema.  He has  good passive range of motion.  Ankles: Good passive range of motion. Hips: Mild discomfort with internal rotation of the left hip.  Good flexion, extension, rotation of the right hip.      Latest Ref Rng & Units 11/07/2020    9:15 AM 07/23/2019    4:57 PM 02/21/2017   11:54 AM  CMP  Glucose 70 - 99 mg/dL 118  87  94   BUN 6 - 24 mg/dL '13  11  15   '$ Creatinine 0.76 - 1.27 mg/dL 1.12  1.20  1.08   Sodium 134 - 144 mmol/L 139  141  141   Potassium 3.5 - 5.2 mmol/L 4.5  5.1  5.2   Chloride 96 - 106 mmol/L 100  104  103   CO2 20 - 29 mmol/L '23  25  22   '$ Calcium 8.7 - 10.2 mg/dL 9.6  9.6  9.6   Total Protein 6.0 - 8.5 g/dL 7.4  7.1  7.3   Total Bilirubin 0.0 - 1.2 mg/dL 0.3  0.2  0.3   Alkaline Phos 44 - 121 IU/L 72  72  65   AST 0 - 40 IU/L '23  26  23   '$ ALT 0 - 44 IU/L 30  35  25    Lipid Panel     Component Value Date/Time   CHOL 107 11/07/2020 0915   TRIG 165 (H) 11/07/2020 0915   HDL 38 (L) 11/07/2020 0915   CHOLHDL 2.8 11/07/2020 0915   CHOLHDL 4.0 01/31/2016 1047   VLDL 33 (H) 01/31/2016 1047   LDLCALC 41 11/07/2020 0915    CBC    Component Value Date/Time   WBC 8.3 08/02/2021 1059   WBC 10.7 01/31/2016 1047   RBC 5.68 08/02/2021 1059   RBC 5.14 01/31/2016 1047   HGB 17.5 08/02/2021 1059   HCT 51.2 (H) 08/02/2021 1059   PLT 230 08/02/2021 1059   MCV 90 08/02/2021 1059   MCH 30.8 08/02/2021 1059   MCH 31.3 01/31/2016 1047   MCHC 34.2 08/02/2021 1059   MCHC 33.6 01/31/2016 1047   RDW 12.9 08/02/2021 1059   LYMPHSABS 2.6 08/02/2021 1059   MONOABS 642 01/31/2016 1047    EOSABS 0.2 08/02/2021 1059   BASOSABS 0.1 08/02/2021 1059    ASSESSMENT AND PLAN: 1. Bilateral hip pain Recommend that he picks up meloxicam today from the pharmacy which he intends to do.  Short course of Tylenol No. 3 given.  [place] controlled substance reporting system reviewed. I would like to refer him to orthopedics but he does not have the orange card or cone discount card as yet.  He plans to apply. - acetaminophen-codeine (TYLENOL #3) 300-30 MG tablet; Take 2 tablets by mouth 2 (two) times daily as needed for moderate pain.  Dispense: 20 tablet; Refill: 0 - meloxicam (MOBIC) 15 MG tablet; Take 1 tablet (15 mg total) by mouth daily.  Dispense: 30 tablet; Refill: 4  2. Post-traumatic osteoarthritis of right knee See #1 above - meloxicam (MOBIC) 15 MG tablet; Take 1 tablet (15 mg total) by mouth daily.  Dispense: 30 tablet; Refill: 4  3. Mouth pain Sounds like this may be nerve pain.  Recommend giving a trial of gabapentin at bedtime. - gabapentin (NEURONTIN) 300 MG capsule; Take 1 capsule (300 mg total) by mouth at bedtime.  Dispense: 30 capsule; Refill: 1  4. Mixed hyperlipidemia - rosuvastatin (CRESTOR) 20 MG tablet; Take 1 tablet (20 mg total) by mouth daily.  Dispense: 30  tablet; Refill: 3    Patient was given the opportunity to ask questions.  Patient verbalized understanding of the plan and was able to repeat key elements of the plan.   This documentation was completed using Radio producer.  Any transcriptional errors are unintentional.  No orders of the defined types were placed in this encounter.    Requested Prescriptions    No prescriptions requested or ordered in this encounter    No follow-ups on file.  Karle Plumber, MD, FACP

## 2021-12-04 ENCOUNTER — Ambulatory Visit: Payer: Self-pay | Admitting: *Deleted

## 2021-12-04 ENCOUNTER — Other Ambulatory Visit: Payer: Self-pay

## 2021-12-04 ENCOUNTER — Ambulatory Visit: Payer: Self-pay | Admitting: Internal Medicine

## 2021-12-04 DIAGNOSIS — K1379 Other lesions of oral mucosa: Secondary | ICD-10-CM

## 2021-12-04 MED ORDER — AMOXICILLIN 500 MG PO CAPS
500.0000 mg | ORAL_CAPSULE | Freq: Three times a day (TID) | ORAL | 0 refills | Status: DC
  Filled 2021-12-04: qty 21, 7d supply, fill #0

## 2021-12-04 MED ORDER — GABAPENTIN 300 MG PO CAPS
300.0000 mg | ORAL_CAPSULE | Freq: Two times a day (BID) | ORAL | 1 refills | Status: DC
  Filled 2021-12-04: qty 60, 30d supply, fill #0
  Filled 2022-01-08: qty 60, 30d supply, fill #1

## 2021-12-04 NOTE — Addendum Note (Signed)
Addended by: Karle Plumber B on: 12/04/2021 02:00 PM   Modules accepted: Orders

## 2021-12-04 NOTE — Telephone Encounter (Signed)
Routing to PCP for review.

## 2021-12-04 NOTE — Telephone Encounter (Signed)
  Chief Complaint: mouth pain  Symptoms: L sided mouth pain 10+/10 Frequency: ongoing 2-3 months  Pertinent Negatives: Patient denies redness or swelling  Disposition: '[]'$ ED /'[]'$ Urgent Care (no appt availability in office) / '[]'$ Appointment(In office/virtual)/ '[]'$  [place] [facility]/ '[]'$ Home Care/ '[]'$ Refused Recommended Disposition /'[]'$ [place] [facility]/ '[x]'$  Follow-up with PCP Additional Notes: pt states he is unable to come in today d/t not having power and mother is chair bound and no one able to stay with her. Pt unsure when power will be turned back on but is requesting an abx or something for mouth pain. He isn't sure if infection or not but says the gabapentin didn't help at all without having to take higher dose. Pt is asking if that dose can be increased as well. Pt doesn't feel like root canal infection or anything d/t no redness or swelling but just wanting to try abx to see if it will help or not. I advised pt since he is unable to come in or go to [facility], I would send message back to Dr. Wynetta Emery and her nurse can FU with him. Pt verbalized understanding. Says CB # right now will be main contact since he doesn't have land line access.   Summary: Antibiotic request   Patient states he has a mouth infection and would like an antibiotic.     Reason for Disposition  [1] SEVERE mouth pain (e.g., excruciating) AND [2] not improved after 2 hours of pain medicine  Answer Assessment - Initial Assessment Questions 1. ONSET: "When did the mouth start hurting?" (e.g., hours or days ago)      Ongoing for 2-3 month  2. SEVERITY: "How bad is the pain?" (Scale 1-10; mild, moderate or severe)   - MILD (1-3):  doesn't interfere with eating or normal activities   - MODERATE (4-7): interferes with eating some solids and normal activities   - SEVERE (8-10):  excruciating pain, interferes with most normal activities   - SEVERE DYSPHAGIA: can't swallow liquids, drooling     Feels like 1000 yellow jackets  stinging  3. SORES: "Are there any sores or ulcers in the mouth?" If Yes, ask: "What part of the mouth are the sores in?"     no 5. CAUSE: "What do you think is causing the mouth pain?"     Unsure if infection or not  6. OTHER SYMPTOMS: "Do you have any other symptoms?" (e.g., difficulty breathing)     No redness or swelling, has severe pain on L side of mouth and tongue  Protocols used: Mouth Pain-A-AH

## 2021-12-04 NOTE — Telephone Encounter (Signed)
Summary: Antibiotic request   Patient states he has a mouth infection and would like an antibiotic.

## 2021-12-05 NOTE — Telephone Encounter (Signed)
Called and spoke to patient. Confirmed name & DOB. Informed of message below. Patient expressed understanding.

## 2022-01-08 ENCOUNTER — Ambulatory Visit: Payer: Self-pay

## 2022-01-08 ENCOUNTER — Other Ambulatory Visit: Payer: Self-pay

## 2022-01-08 ENCOUNTER — Other Ambulatory Visit: Payer: Self-pay | Admitting: Internal Medicine

## 2022-01-08 DIAGNOSIS — K1379 Other lesions of oral mucosa: Secondary | ICD-10-CM

## 2022-01-08 NOTE — Telephone Encounter (Signed)
Summary: Mouth burning/pain   The patient called in stating he finished his antibiotic and gabapentin which did seem to help but says the pain and stinging in his mouth has come back with a vengeance. He said it feels like he has being stung by 10,000 yellow jackets in his mouth. He says he has really bad teeth and gums but doesn't know what to do. He says he is going to call the pharmacy to ask for refills but also wants an increase of dosage on both medications. He has also requested some type of mouth swab be done if possible. Please assist patient further      Called pt - Call could not be completed at this time.

## 2022-01-08 NOTE — Telephone Encounter (Signed)
  Chief Complaint: Mouth stinging - 10,000 yellow jackets in his mouth Symptoms: Stinging Frequency: October Pertinent Negatives: Patient denies any other s/s Disposition: '[]'$ ED /'[]'$ Urgent Care (no appt availability in office) / '[]'$ Appointment(In office/virtual)/ '[]'$  [place] [facility]/ '[]'$ Home Care/ '[]'$ Refused Recommended Disposition /'[]'$ [facility] [facility]/ '[x]'$  Follow-up with PCP Additional Notes: Pt states that he feels like there are 10,000 yellow jackets stinging him constantly in the mouth. Pt states that he does have a lot of mouth dental issues, but this is different. Pt is wondering if this could be a viral infection. PT would like a swab taken and sent to the lab for testing for viral pathogens.   Pt will be at the office this afternoon to p/u htn meds and would like to come upstairs for swab.   Pt would like refills of Gabapentin and antibiotics as they have helped in the past.   Pt not interested in [place].  Please advise.     Summary: Mouth burning/pain   The patient called in stating he finished his antibiotic and gabapentin which did seem to help but says the pain and stinging in his mouth has come back with a vengeance. He said it feels like he has being stung by 10,000 yellow jackets in his mouth. He says he has really bad teeth and gums but doesn't know what to do. He says he is going to call the pharmacy to ask for refills but also wants an increase of dosage on both medications. He has also requested some type of mouth swab be done if possible. Please assist patient further     Reason for Disposition  [1] MILD-MODERATE mouth pain AND [2] present > 3 days  Answer Assessment - Initial Assessment Questions 1. ONSET: "When did the mouth start hurting?" (e.g., hours or days ago)      October 2. SEVERITY: "How bad is the pain?" (Scale 1-10; mild, moderate or severe)   - MILD (1-3):  doesn't interfere with eating or normal activities   - MODERATE (4-7): interferes with eating  some solids and normal activities   - SEVERE (8-10):  excruciating pain, interferes with most normal activities   - SEVERE DYSPHAGIA: can't swallow liquids, drooling     Severe 3. SORES: "Are there any sores or ulcers in the mouth?" If Yes, ask: "What part of the mouth are the sores in?"     no 4. FEVER: "Do you have a fever?" If Yes, ask: "What is your temperature, how was it measured, and when did it start?"     no 5. CAUSE: "What do you think is causing the mouth pain?"     Unsure 6. OTHER SYMPTOMS: "Do you have any other symptoms?" (e.g., difficulty breathing)     no  Protocols used: Mouth Pain-A-AH

## 2022-01-08 NOTE — Telephone Encounter (Signed)
Spoke with patient s/s still present. Patient scheduled fan virtual appt. On 01/04324 with Dr. Wynetta Emery

## 2022-01-10 ENCOUNTER — Ambulatory Visit: Payer: Self-pay | Attending: Internal Medicine | Admitting: Internal Medicine

## 2022-01-10 ENCOUNTER — Other Ambulatory Visit: Payer: Self-pay | Admitting: Internal Medicine

## 2022-01-10 ENCOUNTER — Other Ambulatory Visit: Payer: Self-pay

## 2022-01-10 DIAGNOSIS — K1379 Other lesions of oral mucosa: Secondary | ICD-10-CM

## 2022-01-10 DIAGNOSIS — K029 Dental caries, unspecified: Secondary | ICD-10-CM

## 2022-01-10 MED ORDER — GABAPENTIN 300 MG PO CAPS
300.0000 mg | ORAL_CAPSULE | Freq: Two times a day (BID) | ORAL | 2 refills | Status: DC
  Filled 2022-01-10 – 2022-02-12 (×2): qty 60, 30d supply, fill #0
  Filled 2022-05-08: qty 60, 30d supply, fill #1

## 2022-01-10 NOTE — Progress Notes (Signed)
Patient ID: BARI HANDSHOE, male   DOB: [birth year], 62 y.o.   MRN: [id number] Virtual Visit via Telephone Note  I connected with Dustin [name] on 01/10/2022 at 8:24 a.m by telephone and verified that I am speaking with the correct person using two identifiers  Location: Patient: home Provider: office  Participants: Myself Patient   I discussed the limitations, risks, security and privacy concerns of performing an evaluation and management service by telephone and the availability of in person appointments. I also discussed with the patient that there may be a patient responsible charge related to this service. The patient expressed understanding and agreed to proceed.   History of Present Illness: Pt with hx of HTN, HL, OA, obesity, DD LS spine, false positive HIV testing 2018, tob dep.  This is an urgent care visit for dental concerns.  Patient has ongoing dental issues. On last visit in October, he complained of stinging sensation in his mouth like he is being stung by yellow jackets.  We started him on gabapentin for possible neuropathic pain and gave a course of antibiotics.  Patient then called back the end of November requesting an increased dose of the gabapentin and possible round of antibiotics.  We increased the gabapentin from 300 mg daily to 300 mg twice a day.  Patient states that the last course of amoxicillin and increased dose of gabapentin helped for several days then the stinging sensation in his mouth came back and this time it is all over the mouth including the roof of the mouth.  He wonders whether it is of viral etiology.  Reports having had Bell's palsy in the past.  Wanting to know whether any blood test could be done to figure out what is happening with his mouth.  In the meantime, he would like refill on the gabapentin and a stronger antibiotics to be taken for a longer period of time.  No signs of infection of the gum at this time.  He has known dental cavities.  No  insurance as of yet.  He has not applied for the orange card as yet.    Outpatient Encounter Medications as of 01/10/2022  Medication Sig Note   acetaminophen-codeine (TYLENOL #3) 300-30 MG tablet Take 2 tablets by mouth 2 (two) times daily as needed for moderate pain.    amitriptyline (ELAVIL) 50 MG tablet TAKE 1 TABLET (50 MG TOTAL) BY MOUTH AT BEDTIME AS NEEDED. FOR SLEEP (Patient taking differently: Take 50 mg by mouth at bedtime.)    amLODipine (NORVASC) 10 MG tablet Take 1 tablet (10 mg total) by mouth daily.    amoxicillin (AMOXIL) 500 MG capsule Take 1 capsule (500 mg total) by mouth 3 (three) times daily.    aspirin EC 81 MG tablet Take 1 tablet (81 mg total) by mouth daily. Swallow whole.    gabapentin (NEURONTIN) 300 MG capsule Take 1 capsule (300 mg total) by mouth 2 (two) times daily.    hydrochlorothiazide (HYDRODIURIL) 25 MG tablet TAKE 1 TABLET (25 MG TOTAL) BY MOUTH DAILY.    meloxicam (MOBIC) 15 MG tablet Take 1 tablet (15 mg total) by mouth daily.    metoprolol succinate (TOPROL-XL) 50 MG 24 hr tablet TAKE 1 TABLET (50 MG TOTAL) BY MOUTH DAILY.    nicotine (NICODERM CQ - DOSED IN MG/24 HOURS) 21 mg/24hr patch Place 1 patch (21 mg total) onto the skin daily. 09/21/2021: Pt has 2 patches on   olopatadine (PATANOL) 0.1 % ophthalmic solution Place 1  drop into both eyes 2 (two) times daily. (Patient not taking: Reported on 10/25/2021)    rosuvastatin (CRESTOR) 20 MG tablet Take 1 tablet (20 mg total) by mouth daily.    No facility-administered encounter medications on file as of 01/10/2022.      Observations/Objective: No direct observation done as this was a telephone visit.  Assessment and Plan: 1. Mouth pain 2. Dental caries -Strongly advised patient to apply for Medicaid now that it has been expanded in our state.  Patient states he plans to go to social services to initiate.  Once approved he can then get in with a dentist/oral surgeon.  I do not think ordering any labs  will help in this situation.  I will hold off on prescribing antibiotics if he does not have signs/symptoms of infection.  He will continue the gabapentin for possible nerve pain.  Refill sent to his pharmacy.  Patient expressed understanding and is agreeable to the plan.   Follow Up Instructions: As needed.   I discussed the assessment and treatment plan with the patient. The patient was provided an opportunity to ask questions and all were answered. The patient agreed with the plan and demonstrated an understanding of the instructions.   The patient was advised to call back or seek an in-person evaluation if the symptoms worsen or if the condition fails to improve as anticipated.  I  Spent 8 minutes on this telephone encounter  This note has been created with Surveyor, quantity. Any transcriptional errors are unintentional.  Karle Plumber, MD

## 2022-01-14 ENCOUNTER — Other Ambulatory Visit: Payer: Self-pay

## 2022-01-14 ENCOUNTER — Telehealth: Payer: Self-pay | Admitting: Internal Medicine

## 2022-02-05 ENCOUNTER — Ambulatory Visit: Payer: Commercial Managed Care - HMO | Attending: Internal Medicine | Admitting: Internal Medicine

## 2022-02-05 ENCOUNTER — Other Ambulatory Visit: Payer: Self-pay

## 2022-02-05 ENCOUNTER — Encounter: Payer: Self-pay | Admitting: Internal Medicine

## 2022-02-05 VITALS — BP 131/78 | HR 67 | Temp 98.1°F | Ht 72.0 in | Wt 264.0 lb

## 2022-02-05 DIAGNOSIS — E782 Mixed hyperlipidemia: Secondary | ICD-10-CM | POA: Diagnosis not present

## 2022-02-05 DIAGNOSIS — Z125 Encounter for screening for malignant neoplasm of prostate: Secondary | ICD-10-CM

## 2022-02-05 DIAGNOSIS — F17200 Nicotine dependence, unspecified, uncomplicated: Secondary | ICD-10-CM

## 2022-02-05 DIAGNOSIS — M25552 Pain in left hip: Secondary | ICD-10-CM

## 2022-02-05 DIAGNOSIS — M25551 Pain in right hip: Secondary | ICD-10-CM

## 2022-02-05 DIAGNOSIS — M170 Bilateral primary osteoarthritis of knee: Secondary | ICD-10-CM

## 2022-02-05 DIAGNOSIS — I10 Essential (primary) hypertension: Secondary | ICD-10-CM

## 2022-02-05 MED ORDER — NICOTINE 21 MG/24HR TD PT24
21.0000 mg | MEDICATED_PATCH | Freq: Every day | TRANSDERMAL | 2 refills | Status: AC
  Filled 2022-02-05: qty 28, 28d supply, fill #0

## 2022-02-05 MED ORDER — MELOXICAM 15 MG PO TABS
15.0000 mg | ORAL_TABLET | Freq: Every day | ORAL | 4 refills | Status: DC
  Filled 2022-02-05: qty 30, 30d supply, fill #0

## 2022-02-05 NOTE — Progress Notes (Signed)
Patient ID: Dustin [name], male    DOB: [birth year]  MRN: [id number]  CC: Hip Pain (Hip pain f/u.  Med refill. Requesting smoking cessation patches/Intermittent pain in the jaw, waking up from pain X3-4 mo/No to flu vax. )   Subjective: Dustin [name] is a 62 y.o. male who presents for chronic ds management His concerns today include:  Pt with hx of HTN, HL, OA, obesity, DD LS spine, false positive HIV testing 2018, tob dep.     Obtained [employer] since last visit.  HTN: Blood pressure looks good today.  Reports compliance with taking amlodipine 10 mg daily, HCTZ 25 mg daily and metoprolol 50 mg daily.  He tries to limit salt in the foods. Reports compliance with taking Crestor.  Now that he has insurance, he will look into seeing a dentist for his numerous dental cavities.  He needs to check to see if he has dental coverage. Mouth pain has not been as bad since last visit with me earlier this month.  The discomfort is intermittent and usually occurs in the early morning hours.  He has found gabapentin to be helpful.  He also found that cutting back on smoking has helped.  Tobacco dependence: Smoking less.  Now at 4 to 5 cigarettes a day.  Wants to give a trial of quitting.  Requests refill on nicotine patches the 21 mg. Arthritis: Now that he has insurance, he is agreeable to referral to see orthopedics for his knees and right hip.  Right knee is more bothersome than the left.  Requests refill on meloxicam.  He is agreeable to baseline blood test including PSA for prostate cancer screening.  Patient Active Problem List   Diagnosis Date Noted   Obesity (BMI 30.0-34.9) 03/10/2020   Influenza vaccination declined 11/11/2019   Polyarthritis 11/11/2019   HLD (hyperlipidemia)    Chronic cough 07/23/2019   Tobacco dependence 07/23/2019   Chest pain in adult 07/23/2019   OSA (obstructive sleep apnea) 05/15/2016   Neck pain 03/05/2016   Low back pain 03/05/2016   DDD  (degenerative disc disease), lumbosacral 01/31/2016   Acute meniscal tear of left knee 07/05/2014   Left knee pain 06/16/2014   Essential hypertension 06/16/2014   Other headache syndrome 02/17/2014   Chronic pain syndrome 02/17/2014   Medication overuse headache 05/06/2013   Dental caries 04/06/2013   Headache(784.0) 04/06/2013     Current Outpatient Medications on File Prior to Visit  Medication Sig Dispense Refill   acetaminophen-codeine (TYLENOL #3) 300-30 MG tablet Take 2 tablets by mouth 2 (two) times daily as needed for moderate pain. 20 tablet 0   amitriptyline (ELAVIL) 50 MG tablet TAKE 1 TABLET (50 MG TOTAL) BY MOUTH AT BEDTIME AS NEEDED. FOR SLEEP (Patient taking differently: Take 50 mg by mouth at bedtime.) 90 tablet 1   amLODipine (NORVASC) 10 MG tablet Take 1 tablet (10 mg total) by mouth daily. 90 tablet 3   amoxicillin (AMOXIL) 500 MG capsule Take 1 capsule (500 mg total) by mouth 3 (three) times daily. 21 capsule 0   aspirin EC 81 MG tablet Take 1 tablet (81 mg total) by mouth daily. Swallow whole. 100 tablet 1   gabapentin (NEURONTIN) 300 MG capsule Take 1 capsule (300 mg total) by mouth 2 (two) times daily. 60 capsule 2   hydrochlorothiazide (HYDRODIURIL) 25 MG tablet TAKE 1 TABLET (25 MG TOTAL) BY MOUTH DAILY. 90 tablet 3   meloxicam (MOBIC) 15 MG tablet Take 1 tablet (15  mg total) by mouth daily. 30 tablet 4   metoprolol succinate (TOPROL-XL) 50 MG 24 hr tablet TAKE 1 TABLET (50 MG TOTAL) BY MOUTH DAILY. 90 tablet 3   nicotine (NICODERM CQ - DOSED IN MG/24 HOURS) 21 mg/24hr patch Place 1 patch (21 mg total) onto the skin daily. 28 patch 0   olopatadine (PATANOL) 0.1 % ophthalmic solution Place 1 drop into both eyes 2 (two) times daily. 5 mL 0   rosuvastatin (CRESTOR) 20 MG tablet Take 1 tablet (20 mg total) by mouth daily. 30 tablet 3   No current facility-administered medications on file prior to visit.    No Known Allergies  Social History   Socioeconomic  History   Marital status: Legally Separated    Spouse name: Not on file   Number of children: Not on file   Years of education: Not on file   Highest education level: Not on file  Occupational History   Not on file  Tobacco Use   Smoking status: Every Day    Packs/day: 0.50    Types: Cigarettes   Smokeless tobacco: Never  Vaping Use   Vaping Use: Never used  Substance and Sexual Activity   Alcohol use: Yes    Alcohol/week: 0.0 standard drinks of alcohol    Comment: occasionally- "3 shots a month"   Drug use: No   Sexual activity: Not Currently    Partners: Female  Other Topics Concern   Not on file  Social History Narrative   Not on file   Social Determinants of Health   Financial Resource Strain: Not on file  Food Insecurity: Not on file  Transportation Needs: Not on file  Physical Activity: Not on file  Stress: Not on file  Social Connections: Not on file  Intimate Partner Violence: Not on file    Family History  Problem Relation Age of Onset   Rheum arthritis Mother    Peripheral Artery Disease Mother    Cancer Father    Diabetes Father     Past Surgical History:  Procedure Laterality Date   ANTERIOR CRUCIATE LIGAMENT REPAIR     NECK SURGERY      ROS: Review of Systems Negative except as stated above  PHYSICAL EXAM: BP 131/78 (BP Location: Left Arm, Patient Position: Sitting, Cuff Size: Large)   Pulse 67   Temp 98.1 F (36.7 C) (Oral)   Ht 6' (1.829 m)   Wt 264 lb (119.7 kg)   SpO2 94%   BMI 35.80 kg/m   Wt Readings from Last 3 Encounters:  02/05/22 264 lb (119.7 kg)  10/25/21 253 lb 6.4 oz (114.9 kg)  09/20/21 250 lb (113.4 kg)    Physical Exam   General appearance - alert, well appearing, older Caucasian male and in no distress Mental status - normal mood, behavior, speech, dress, motor activity, and thought processes Chest - clear to auscultation, no wheezes, rales or rhonchi, symmetric air entry Heart - normal rate, regular rhythm,  normal S1, S2, no murmurs, rubs, clicks or gallops Musculoskeletal -knees: Mild enlargement of the knee joints.  No point tenderness.  Good range of motion.  Patient ambulates unassisted. Extremities - peripheral pulses normal, no pedal edema, no clubbing or cyanosis     Latest Ref Rng & Units 11/07/2020    9:15 AM 07/23/2019    4:57 PM 02/21/2017   11:54 AM  CMP  Glucose 70 - 99 mg/dL 118  87  94   BUN 6 - 24 mg/dL  $'13  11  15   'M$ Creatinine 0.76 - 1.27 mg/dL 1.12  1.20  1.08   Sodium 134 - 144 mmol/L 139  141  141   Potassium 3.5 - 5.2 mmol/L 4.5  5.1  5.2   Chloride 96 - 106 mmol/L 100  104  103   CO2 20 - 29 mmol/L '23  25  22   '$ Calcium 8.7 - 10.2 mg/dL 9.6  9.6  9.6   Total Protein 6.0 - 8.5 g/dL 7.4  7.1  7.3   Total Bilirubin 0.0 - 1.2 mg/dL 0.3  0.2  0.3   Alkaline Phos 44 - 121 IU/L 72  72  65   AST 0 - 40 IU/L '23  26  23   '$ ALT 0 - 44 IU/L 30  35  25    Lipid Panel     Component Value Date/Time   CHOL 107 11/07/2020 0915   TRIG 165 (H) 11/07/2020 0915   HDL 38 (L) 11/07/2020 0915   CHOLHDL 2.8 11/07/2020 0915   CHOLHDL 4.0 01/31/2016 1047   VLDL 33 (H) 01/31/2016 1047   LDLCALC 41 11/07/2020 0915    CBC    Component Value Date/Time   WBC 8.3 08/02/2021 1059   WBC 10.7 01/31/2016 1047   RBC 5.68 08/02/2021 1059   RBC 5.14 01/31/2016 1047   HGB 17.5 08/02/2021 1059   HCT 51.2 (H) 08/02/2021 1059   PLT 230 08/02/2021 1059   MCV 90 08/02/2021 1059   MCH 30.8 08/02/2021 1059   MCH 31.3 01/31/2016 1047   MCHC 34.2 08/02/2021 1059   MCHC 33.6 01/31/2016 1047   RDW 12.9 08/02/2021 1059   LYMPHSABS 2.6 08/02/2021 1059   MONOABS 642 01/31/2016 1047   EOSABS 0.2 08/02/2021 1059   BASOSABS 0.1 08/02/2021 1059    ASSESSMENT AND PLAN: 1. Essential hypertension Close to goal.  Continue medications listed above. - Comprehensive metabolic panel - Lipid panel  2. Mixed hyperlipidemia Continue Crestor.  Check lipid profile today.  3. Tobacco dependence Commended him  on wanting to quit.  Prescription sent for nicotine patches - nicotine (NICODERM CQ - DOSED IN MG/24 HOURS) 21 mg/24hr patch; Place 1 patch (21 mg total) onto the skin daily.  Dispense: 28 patch; Refill: 2  4. Bilateral hip pain - Ambulatory referral to Orthopedic Surgery - DG Hip Unilat W OR W/O Pelvis Min 4 Views Right; Future  5. Arthritis of both knees - meloxicam (MOBIC) 15 MG tablet; Take 1 tablet (15 mg total) by mouth daily.  Dispense: 30 tablet; Refill: 4 - Ambulatory referral to Orthopedic Surgery - DG Knee Complete 4 Views Right; Future - DG Knee Complete 4 Views Left; Future  6. Prostate cancer screening - PSA     Patient was given the opportunity to ask questions.  Patient verbalized understanding of the plan and was able to repeat key elements of the plan.   This documentation was completed using Radio producer.  Any transcriptional errors are unintentional.  No orders of the defined types were placed in this encounter.    Requested Prescriptions    No prescriptions requested or ordered in this encounter    No follow-ups on file.  Karle Plumber, MD, FACP

## 2022-02-06 ENCOUNTER — Other Ambulatory Visit: Payer: Self-pay

## 2022-02-06 LAB — COMPREHENSIVE METABOLIC PANEL
ALT: 18 IU/L (ref 0–44)
AST: 18 IU/L (ref 0–40)
Albumin/Globulin Ratio: 1.7 (ref 1.2–2.2)
Albumin: 4.7 g/dL (ref 3.8–4.9)
Alkaline Phosphatase: 68 IU/L (ref 44–121)
BUN/Creatinine Ratio: 8 — ABNORMAL LOW (ref 9–20)
BUN: 8 mg/dL (ref 6–24)
Bilirubin Total: 0.2 mg/dL (ref 0.0–1.2)
CO2: 21 mmol/L (ref 20–29)
Calcium: 9.6 mg/dL (ref 8.7–10.2)
Chloride: 100 mmol/L (ref 96–106)
Creatinine, Ser: 1.01 mg/dL (ref 0.76–1.27)
Globulin, Total: 2.7 g/dL (ref 1.5–4.5)
Glucose: 102 mg/dL — ABNORMAL HIGH (ref 70–99)
Potassium: 5 mmol/L (ref 3.5–5.2)
Sodium: 139 mmol/L (ref 134–144)
Total Protein: 7.4 g/dL (ref 6.0–8.5)
eGFR: 86 mL/min/{1.73_m2} (ref >59)

## 2022-02-06 LAB — LIPID PANEL
Chol/HDL Ratio: 2.7 ratio (ref 0.0–5.0)
Cholesterol, Total: 142 mg/dL (ref 100–199)
HDL: 53 mg/dL (ref >39)
LDL Chol Calc (NIH): 69 mg/dL (ref 0–99)
Triglycerides: 111 mg/dL (ref 0–149)
VLDL Cholesterol Cal: 20 mg/dL (ref 5–40)

## 2022-02-06 LAB — PSA: Prostate Specific Ag, Serum: 0.6 ng/mL (ref 0.0–4.0)

## 2022-02-12 ENCOUNTER — Ambulatory Visit (INDEPENDENT_AMBULATORY_CARE_PROVIDER_SITE_OTHER): Payer: Commercial Managed Care - HMO

## 2022-02-12 ENCOUNTER — Ambulatory Visit (INDEPENDENT_AMBULATORY_CARE_PROVIDER_SITE_OTHER): Payer: Commercial Managed Care - HMO | Admitting: Physician Assistant

## 2022-02-12 ENCOUNTER — Other Ambulatory Visit: Payer: Self-pay

## 2022-02-12 DIAGNOSIS — M25552 Pain in left hip: Secondary | ICD-10-CM | POA: Diagnosis not present

## 2022-02-12 DIAGNOSIS — M545 Low back pain, unspecified: Secondary | ICD-10-CM

## 2022-02-12 DIAGNOSIS — G8929 Other chronic pain: Secondary | ICD-10-CM

## 2022-02-12 NOTE — Progress Notes (Signed)
Office Visit Note   Patient: Dustin [name]           Date of Birth: [birth year]           MRN: [id number] Visit Date: 02/12/2022              Requested by: Ladell Pier, MD [address] [place],   [id number] PCP: Ladell Pier, MD   Assessment & Plan: Visit Diagnoses:  1. Chronic midline low back pain without sciatica   2. 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  He also has been seen on and off since September for weakness in his lower legs and his hips just give out.  He comes in today with multiple complaints.  He is using a walker.  He also says he feels like his thoracic spine is being compressed and is very tender even to light touch.  Denies any shortness of breath or chest pain.  He says that when he walks his hips have a tendency to "give out on him "he shows me with his walker how he has to sometimes drag his right foot.  He has maximized both gabapentin and Tylenol.  On examination today he is neurologically intact and overall has good strength in his lower extremities.  Does not really have much pain with manipulation of his hips and his hip x-ray taken today did not show significant degenerative changes.  His back x-rays however did saw US show significant degeneration.  He did not have insurance so is beginning to be seen again for all of these problems.  He has had ongoing problems for several months.  I recommend physical therapy.  I think this might be helpful to him.  He also wants to be referred to pain management as his pain medication just does not help him and he is maxed out on nonnarcotic medication.  Again happy to do this for him.  Given some of his symptoms and examination I also think he would do well with a neurology referral.  He has seen Dr. Tomi Likens in the past for other issues.  He can follow-up with Dr. Lorin Mercy regarding his  back issues in about 4 to 6 weeks hopefully after some of this has been completed  Follow-Up Instructions: Return in about 1 week (around 02/19/2022), or 6 weeks with Dr. Lorin Mercy.   Orders:  Orders Placed This Encounter  Procedures   XR Lumbar Spine 2-3 Views   XR HIP UNILAT W OR W/O PELVIS 2-3 VIEWS LEFT   No orders of the defined types were placed in this encounter.     Procedures: No procedures performed   Clinical Data: No additional findings.   Subjective: Chief Complaint  Patient presents with   Lower Back - Pain   Left Hip - Pain   Right Hip - Pain    HPI Dustin [name] is a 62 year old gentleman who presents today with multiple complaints.  He has a 6 to 45-monthhistory of what he describes as hip pain and giving way.  He has lower back pain.  He also has neurologic findings of feeling like he has gotten very sensitive even to light touch over his thoracic area and feels like "the hell is squeezing me "denies any loss of bowel or bladder control.  This has been going on for quite a while  but he did not have any insurance.  He is trying to file for SSI  Review of Systems  All other systems reviewed and are negative.    Objective: Vital Signs: There were no vitals taken for this visit.  Physical Exam Constitutional:      Appearance: Normal appearance.  Pulmonary:     Effort: Pulmonary effort is normal.  Skin:    General: Skin is warm and dry.  Neurological:     General: No focal deficit present.     Mental Status: He is alert.     Ortho Exam Patient ambulates with the use of the walker.  Bilateral hips does not really have any pain with manipulation.  He has good strength in dorsiflexion plantarflexion of his ankles slightly less strong with extension and flexion of his legs although is able to sustain a straight leg raise.  Straight leg raise bilaterally is negative.  No tenderness to palpation in his lower back.  No real significant radicular findings.  He is  neurovascularly intact. Specialty Comments:  No specialty comments available.  Imaging: XR Lumbar Spine 2-3 Views  Result Date: 02/12/2022 2 views of his lumbar spine were obtained today.  He has degenerative changes throughout his lumbar spine with facet arthropathy.  He also has significant decrease in joint space and sclerotic changes most noted at L2-3.  No listhesis or acute fracture noted  XR HIP UNILAT W OR W/O PELVIS 2-3 VIEWS LEFT  Result Date: 02/12/2022 Radiographs of his left hip and AP pelvis demonstrate well-maintained alignment.  Overall congruent spacing between the femoral head and the acetabulum may be 1 small cystic formation.  Very little degenerative changes.  No acute fracture    PMFS History: Patient Active Problem List   Diagnosis Date Noted   Obesity (BMI 30.0-34.9) 03/10/2020   Influenza vaccination declined 11/11/2019   Polyarthritis 11/11/2019   HLD (hyperlipidemia)    Chronic cough 07/23/2019   Tobacco dependence 07/23/2019   Chest pain in adult 07/23/2019   OSA (obstructive sleep apnea) 05/15/2016   Neck pain 03/05/2016   Low back pain 03/05/2016   DDD (degenerative disc disease), lumbosacral 01/31/2016   Acute meniscal tear of left knee 07/05/2014   Left knee pain 06/16/2014   Essential hypertension 06/16/2014   Other headache syndrome 02/17/2014   Chronic pain syndrome 02/17/2014   Medication overuse headache 05/06/2013   Dental caries 04/06/2013   Headache(784.0) 04/06/2013   Past Medical History:  Diagnosis Date   Arthritis    DDD (degenerative disc disease)    GERD (gastroesophageal reflux disease)    Gout    HLD (hyperlipidemia)    Hypertension     Family History  Problem Relation Age of Onset   Rheum arthritis Mother    Peripheral Artery Disease Mother    Cancer Father    Diabetes Father     Past Surgical History:  Procedure Laterality Date   ANTERIOR CRUCIATE LIGAMENT REPAIR     NECK SURGERY     Social History    Occupational History   Not on file  Tobacco Use   Smoking status: Every Day    Packs/day: 0.50    Types: Cigarettes   Smokeless tobacco: Never  Vaping Use   Vaping Use: Never used  Substance and Sexual Activity   Alcohol use: Yes    Alcohol/week: 0.0 standard drinks of alcohol    Comment: occasionally- "3 shots a month"   Drug use: No   Sexual activity: Not Currently  Partners: Female

## 2022-02-25 ENCOUNTER — Ambulatory Visit: Payer: Commercial Managed Care - HMO | Admitting: Physical Therapy

## 2022-03-11 ENCOUNTER — Other Ambulatory Visit: Payer: Self-pay

## 2022-03-13 ENCOUNTER — Ambulatory Visit: Payer: Commercial Managed Care - HMO | Admitting: Orthopaedic Surgery

## 2022-03-13 ENCOUNTER — Encounter: Payer: Self-pay | Admitting: Orthopaedic Surgery

## 2022-03-13 VITALS — BP 140/87 | HR 93 | Ht 72.0 in | Wt 250.0 lb

## 2022-03-13 DIAGNOSIS — M542 Cervicalgia: Secondary | ICD-10-CM

## 2022-03-13 DIAGNOSIS — M549 Dorsalgia, unspecified: Secondary | ICD-10-CM

## 2022-03-13 NOTE — Progress Notes (Signed)
Office Visit Note   Patient: Dustin [name]           Date of Birth: 03/30/1960           MRN: [id number] Visit Date: 03/13/2022              Requested by: Ladell Pier, MD [place],  [place] [address] PCP: Ladell Pier, MD   Assessment & Plan: Visit Diagnoses: No diagnosis found.  Plan: Patient with new history of falling previous cervical fusion for myelopathic changes back in the 2000's it has been at least 15+ years since his procedure.  Not having problems with neck pain pain between the shoulder blades and gait disturbance with falling.  Would recommend proceeding with cervical MRI scan and thoracic MRI scan office follow-up after scan for review.  Follow-Up Instructions: No follow-ups on file.   Orders:  No orders of the defined types were placed in this encounter.  No orders of the defined types were placed in this encounter.     Procedures: No procedures performed   Clinical Data: No additional findings.   Subjective: Chief Complaint  Patient presents with   Middle Back - Pain   Right Hip - Pain   Left Hip - Pain   Right Knee - Pain    HPI 62 year old male had previous cervical decompression and fusion with fusion from C4-C7 and C6 corpectomy with long allograft strut graft which is solidly fused.  Patient has had problems with lower extremity weakness has to use a walker.  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  He has not worked in several years.  He has pain between the scapula.  Weakness is noted proximally in his hips and states at times he has trouble lifting his foot up off the floor with hip flexion weakness.  He uses a walker intermittently.  He has been treated with meloxicam and Tylenol by Dr. Wynetta Emery.  He has a family history of ALS and is concerned that might be his diagnosis.  He is lost weight  from 286 down to 250 in hopes of improving his gait and symptoms.  2018 C-spine images show satisfactory fusion C4-C7.  Patient had hip x-rays 02/12/2022 which did not demonstrate significant hip osteoarthritis.  Lumbar MRI 2018 showed some disc extrusion L4-5, some foraminal narrowing L5-S1.  Moderate foraminal narrowing L3-4 left.  Has been treated by PCP with occasional Tylenol 3, Mobic, Neurontin.  He has had some therapy without improvement.  Review of Systems all systems noncontributory to HPI PI.  No history of chills or fever.   Objective: Vital Signs: BP (!) 140/87   Pulse 93   Ht 6' (1.829 m)   Wt 250 lb (113.4 kg)   BMI 33.91 kg/m   Physical Exam Constitutional:      Appearance: He is well-developed.  HENT:     Head: Normocephalic and atraumatic.     Right Ear: External ear normal.     Left Ear: External ear normal.  Eyes:     Pupils: Pupils are equal, round, and reactive to light.  Neck:     Thyroid: No thyromegaly.     Trachea: No tracheal deviation.  Cardiovascular:     Rate and Rhythm: Normal rate.  Pulmonary:     Effort: Pulmonary effort is normal.     Breath  sounds: No wheezing.  Abdominal:     General: Bowel sounds are normal.     Palpations: Abdomen is soft.  Musculoskeletal:     Cervical back: Neck supple.  Skin:    General: Skin is warm and dry.     Capillary Refill: Capillary refill takes less than 2 seconds.  Neurological:     Mental Status: He is alert and oriented to person, place, and time.  Psychiatric:        Behavior: Behavior normal.        Thought Content: Thought content normal.        Judgment: Judgment normal.     Ortho Exam patient has bilateral brachial plexus tenderness.  Has some hip flexion and abduction weakness more pronounced than gastrocsoleus anterior tib EHL.  Positive Romberg.  Difficulty with heel-to-toe straight line ambulation with balance.  Paraspinal tenderness thoracic region between the scapula.  No lower extremity  clonus.  Specialty Comments:  No specialty comments available.  Imaging: No results found.   PMFS History: Patient Active Problem List   Diagnosis Date Noted   Obesity (BMI 30.0-34.9) 03/10/2020   Influenza vaccination declined 11/11/2019   Polyarthritis 11/11/2019   HLD (hyperlipidemia)    Chronic cough 07/23/2019   Tobacco dependence 07/23/2019   Chest pain in adult 07/23/2019   OSA (obstructive sleep apnea) 05/15/2016   Neck pain 03/05/2016   Low back pain 03/05/2016   DDD (degenerative disc disease), lumbosacral 01/31/2016   Acute meniscal tear of left knee 07/05/2014   Left knee pain 06/16/2014   Essential hypertension 06/16/2014   Other headache syndrome 02/17/2014   Chronic pain syndrome 02/17/2014   Medication overuse headache 05/06/2013   Dental caries 04/06/2013   Headache(784.0) 04/06/2013   Past Medical History:  Diagnosis Date   Arthritis    DDD (degenerative disc disease)    GERD (gastroesophageal reflux disease)    Gout    HLD (hyperlipidemia)    Hypertension     Family History  Problem Relation Age of Onset   Rheum arthritis Mother    Peripheral Artery Disease Mother    Cancer Father    Diabetes Father     Past Surgical History:  Procedure Laterality Date   ANTERIOR CRUCIATE LIGAMENT REPAIR     NECK SURGERY     Social History   Occupational History   Not on file  Tobacco Use   Smoking status: Every Day    Packs/day: 0.50    Types: Cigarettes   Smokeless tobacco: Never  Vaping Use   Vaping Use: Never used  Substance and Sexual Activity   Alcohol use: Yes    Alcohol/week: 0.0 standard drinks of alcohol    Comment: occasionally- "3 shots a month"   Drug use: No   Sexual activity: Not Currently    Partners: Female

## 2022-04-04 ENCOUNTER — Telehealth: Payer: Self-pay | Admitting: Orthopaedic Surgery

## 2022-04-04 NOTE — Telephone Encounter (Signed)
Pt called stating his MRI body parts are wrong and his MRI is this Saturday. Please call this pt ASAP. I sent pt to [facility] extension. Please call pt at [phone].

## 2022-04-06 ENCOUNTER — Ambulatory Visit
Admission: RE | Admit: 2022-04-06 | Discharge: 2022-04-06 | Disposition: A | Discharge status: Home / Self Care | Payer: Commercial Managed Care - HMO | Source: Ambulatory Visit | Attending: Orthopaedic Surgery | Admitting: Orthopaedic Surgery

## 2022-04-06 DIAGNOSIS — M549 Dorsalgia, unspecified: Secondary | ICD-10-CM

## 2022-04-06 DIAGNOSIS — M542 Cervicalgia: Secondary | ICD-10-CM

## 2022-04-08 NOTE — Telephone Encounter (Signed)
Pt had TSP done vs CSP

## 2022-05-08 ENCOUNTER — Other Ambulatory Visit: Payer: Self-pay

## 2022-05-08 ENCOUNTER — Other Ambulatory Visit: Payer: Self-pay | Admitting: Internal Medicine

## 2022-05-08 DIAGNOSIS — M25551 Pain in right hip: Secondary | ICD-10-CM

## 2022-05-08 MED ORDER — ACETAMINOPHEN-CODEINE 300-30 MG PO TABS
1.0000 | ORAL_TABLET | Freq: Two times a day (BID) | ORAL | 0 refills | Status: DC | PRN
  Filled 2022-05-08: qty 20, 10d supply, fill #0

## 2022-05-09 ENCOUNTER — Other Ambulatory Visit: Payer: Self-pay

## 2022-06-07 ENCOUNTER — Ambulatory Visit: Payer: Commercial Managed Care - HMO | Admitting: Internal Medicine

## 2022-07-04 IMAGING — CR DG CHEST 2V
2 series · 2 of 2 positions shown · non-contrast
Comparison: Chest x-ray 11/11/2012.

CLINICAL DATA: 58-year-old male with history of chronic cough for
the past year.

EXAM:
CHEST - 2 VIEW

[chest pa]
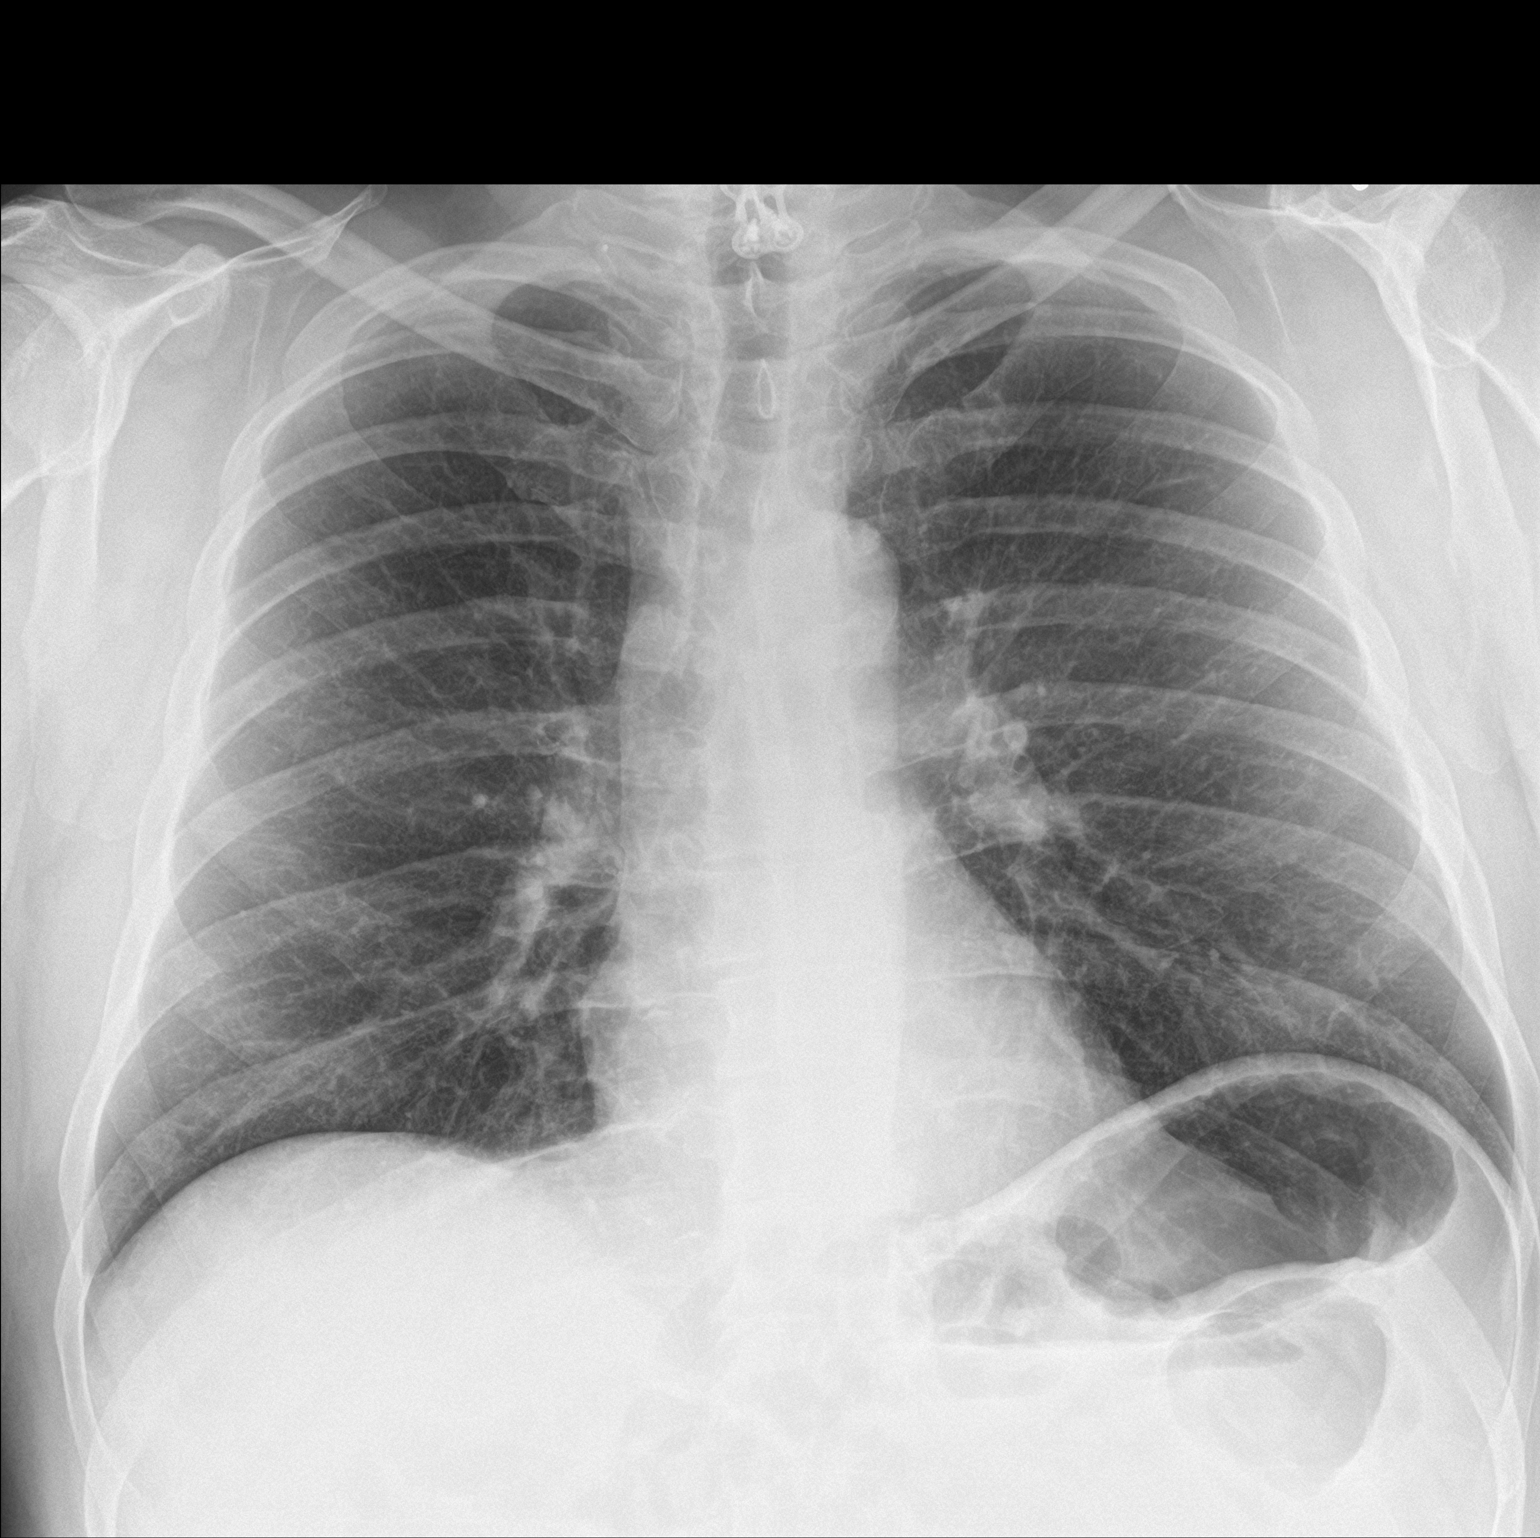

[chest lat]
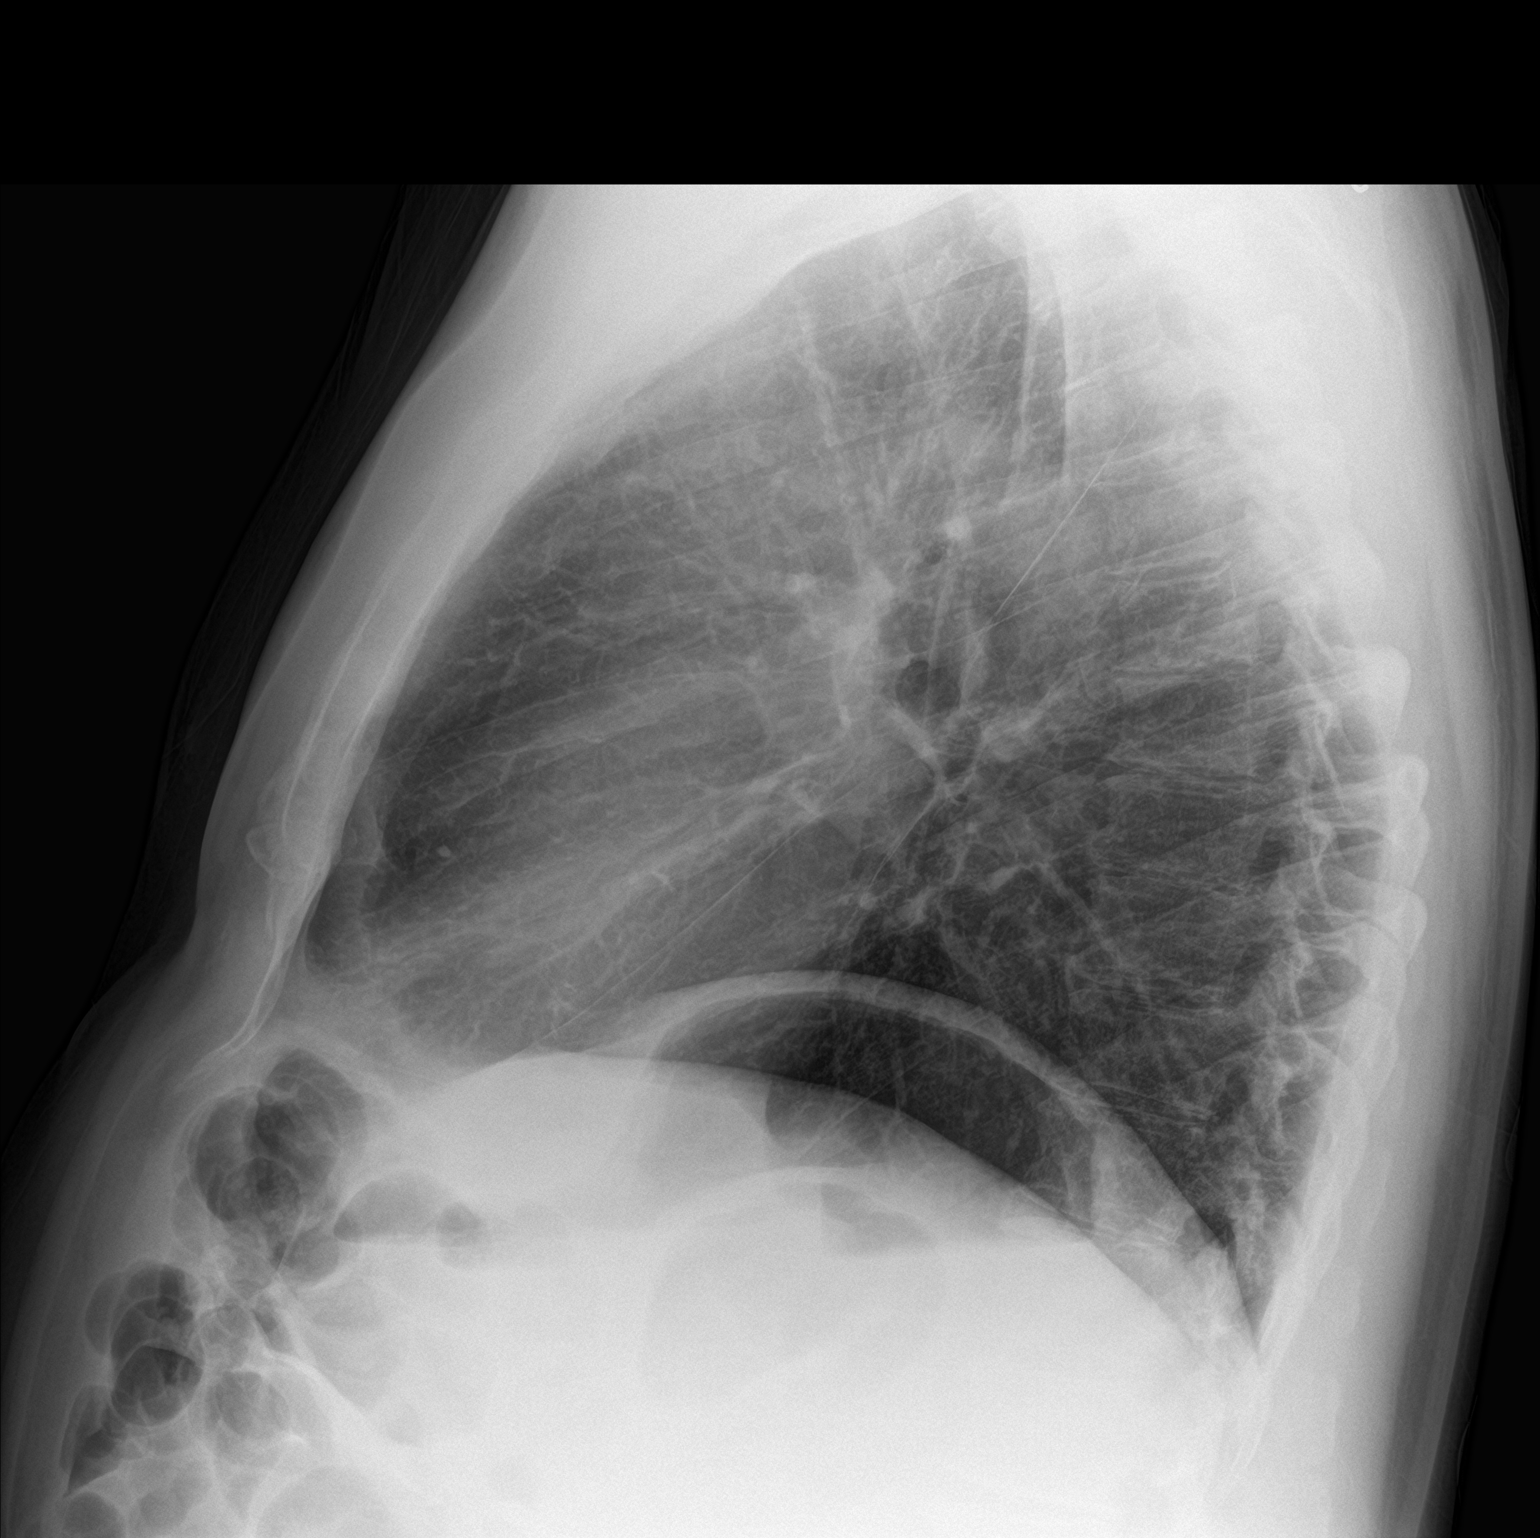

[2 of 2 positions shown; findings below may reference images not displayed]

FINDINGS: Lung volumes are normal. No consolidative airspace disease. No
pleural effusions. No pneumothorax. No pulmonary nodule or mass
noted. Pulmonary vasculature and the cardiomediastinal silhouette
are within normal limits. Orthopedic fixation hardware in the lower
cervical spine incidentally noted.
IMPRESSION: No radiographic evidence of acute cardiopulmonary disease.

## 2022-07-17 ENCOUNTER — Other Ambulatory Visit: Payer: Self-pay

## 2022-07-17 ENCOUNTER — Other Ambulatory Visit: Payer: Self-pay | Admitting: Internal Medicine

## 2022-07-17 DIAGNOSIS — E782 Mixed hyperlipidemia: Secondary | ICD-10-CM

## 2022-07-17 MED ORDER — ROSUVASTATIN CALCIUM 20 MG PO TABS
20.0000 mg | ORAL_TABLET | Freq: Every day | ORAL | 0 refills | Status: DC
  Filled 2022-07-17: qty 30, 30d supply, fill #0

## 2022-07-18 ENCOUNTER — Other Ambulatory Visit: Payer: Self-pay

## 2022-09-23 ENCOUNTER — Other Ambulatory Visit: Payer: Self-pay | Admitting: Internal Medicine

## 2022-09-23 ENCOUNTER — Other Ambulatory Visit: Payer: Self-pay

## 2022-09-23 DIAGNOSIS — I10 Essential (primary) hypertension: Secondary | ICD-10-CM

## 2022-09-23 DIAGNOSIS — E782 Mixed hyperlipidemia: Secondary | ICD-10-CM

## 2022-09-23 DIAGNOSIS — K1379 Other lesions of oral mucosa: Secondary | ICD-10-CM

## 2022-09-24 ENCOUNTER — Other Ambulatory Visit: Payer: Self-pay | Admitting: Internal Medicine

## 2022-09-24 ENCOUNTER — Other Ambulatory Visit: Payer: Self-pay

## 2022-09-24 DIAGNOSIS — I10 Essential (primary) hypertension: Secondary | ICD-10-CM

## 2022-09-24 DIAGNOSIS — E782 Mixed hyperlipidemia: Secondary | ICD-10-CM

## 2022-09-24 DIAGNOSIS — K1379 Other lesions of oral mucosa: Secondary | ICD-10-CM

## 2022-09-24 NOTE — Telephone Encounter (Signed)
Requested medication (s) are due for refill today:   Yes for 4 of them.   No for the Amoxil 500 mg  Requested medication (s) are on the active medication list:   Yes  Future visit scheduled:   Yes 10/15 with Dr. Laural Benes   Last ordered: All ordered 08/02/2021 #90, 3 refills except rosuvastatin is 07/17/2022 #30, 0 refills.   Amoxil was discontinued 02/05/2022.  Returned for provider review prior to upcoming appt.    Labs are due so unable to refill per protocol.   Requested Prescriptions  Pending Prescriptions Disp Refills   amLODipine (NORVASC) 10 MG tablet 90 tablet 3    Sig: Take 1 tablet (10 mg total) by mouth daily.     Cardiovascular: Calcium Channel Blockers 2 Failed - 09/24/2022  1:50 PM      Failed - Last BP in normal range    BP Readings from Last 1 Encounters:  03/13/22 (!) 140/87         Failed - Valid encounter within last 6 months    Recent Outpatient Visits           7 months ago Essential hypertension   [place] [facility] Marcine Matar, MD   8 months ago Mouth pain   [place] [facility] Marcine Matar, MD   11 months ago Bilateral hip pain   [place] [facility] Marcine Matar, MD   1 year ago Essential hypertension   [place] [facility] Marcine Matar, MD   1 year ago Essential hypertension   [place] [facility] Marcine Matar, MD       Future Appointments             In 4 weeks Marcine Matar, MD [facility]            Passed - Last Heart Rate in normal range    Pulse Readings from Last 1 Encounters:  03/13/22 93          amoxicillin (AMOXIL) 500 MG capsule 21 capsule 0    Sig: Take 1 capsule (500 mg total) by mouth 3 (three) times daily.     Off-Protocol Failed - 09/24/2022  1:50 PM      Failed - Medication not assigned to a protocol, review manually.       Passed - Valid encounter within last 12 months    Recent Outpatient Visits           7 months ago Essential hypertension   [place] [facility] Marcine Matar, MD   8 months ago Mouth pain   [place] [facility] Marcine Matar, MD   11 months ago Bilateral hip pain   [place] [facility] Marcine Matar, MD   1 year ago Essential hypertension   [place] [facility] Marcine Matar, MD   1 year ago Essential hypertension   [place] [facility] Marcine Matar, MD       Future Appointments             In 4 weeks Marcine Matar, MD  [facility]             hydrochlorothiazide (HYDRODIURIL) 25 MG tablet 90 tablet  3    Sig: TAKE 1 TABLET (25 MG TOTAL) BY MOUTH DAILY.     Cardiovascular: Diuretics - Thiazide Failed - 09/24/2022  1:50 PM      Failed - Cr in normal range and within 180 days    Creatinine  Date Value Ref Range Status  02/21/2017 182.0 20.0 - 300.0 mg/dL Final   Creat  Date Value Ref Range Status  01/31/2016 1.18 0.70 - 1.33 mg/dL Final    Comment:      For patients > or = 60 years of age: The upper reference limit for Creatinine is approximately 13% higher for people identified as African-American.      Creatinine, Ser  Date Value Ref Range Status  02/05/2022 1.01 0.76 - 1.27 mg/dL Final         Failed - K in normal range and within 180 days    Potassium  Date Value Ref Range Status  02/05/2022 5.0 3.5 - 5.2 mmol/L Final         Failed - Na in normal range and within 180 days    Sodium  Date Value Ref Range Status  02/05/2022 139 134 - 144 mmol/L Final         Failed - Last BP in normal range    BP Readings from Last 1 Encounters:  03/13/22 (!) 140/87         Failed - Valid encounter within last 6 months    Recent Outpatient Visits           7  months ago Essential hypertension   [place] [facility] Marcine Matar, MD   8 months ago Mouth pain   [place] [facility] Marcine Matar, MD   11 months ago Bilateral hip pain   [place] [facility] Marcine Matar, MD   1 year ago Essential hypertension   [place] [facility] Marcine Matar, MD   1 year ago Essential hypertension   [place] [facility] Marcine Matar, MD       Future Appointments             In 4 weeks Marcine Matar, MD [place] [facility]             metoprolol succinate (TOPROL-XL) 50 MG 24 hr tablet 90 tablet 3    Sig: TAKE 1 TABLET (50 MG TOTAL) BY MOUTH DAILY.     Cardiovascular:  Beta Blockers Failed - 09/24/2022  1:50 PM      Failed - Last BP in normal range    BP Readings from Last 1 Encounters:  03/13/22 (!) 140/87         Failed - Valid encounter within last 6 months    Recent Outpatient Visits           7 months ago Essential hypertension   [place] [facility] Marcine Matar, MD   8 months ago Mouth pain   [place] [facility] Marcine Matar, MD   11 months ago Bilateral hip pain   [place] [facility] Marcine Matar, MD   1 year ago Essential hypertension   [place] [facility] Marcine Matar, MD   1 year ago Essential hypertension   [place] [facility]  Marcine Matar, MD       Future Appointments             In 4 weeks Marcine Matar, MD [place] [facility]            Passed - Last Heart Rate in normal range    Pulse Readings from Last 1 Encounters:  03/13/22 93          rosuvastatin (CRESTOR) 20 MG tablet 30 tablet 0    Sig: Take 1 tablet (20 mg total) by mouth  daily.     Cardiovascular:  Antilipid - Statins 2 Failed - 09/24/2022  1:50 PM      Failed - Lipid Panel in normal range within the last 12 months    Cholesterol, Total  Date Value Ref Range Status  02/05/2022 142 100 - 199 mg/dL Final   LDL Chol Calc (NIH)  Date Value Ref Range Status  02/05/2022 69 0 - 99 mg/dL Final   HDL  Date Value Ref Range Status  02/05/2022 53 >39 mg/dL Final   Triglycerides  Date Value Ref Range Status  02/05/2022 111 0 - 149 mg/dL Final         Passed - Cr in normal range and within 360 days    Creatinine  Date Value Ref Range Status  02/21/2017 182.0 20.0 - 300.0 mg/dL Final   Creat  Date Value Ref Range Status  01/31/2016 1.18 0.70 - 1.33 mg/dL Final    Comment:      For patients > or = 60 years of age: The upper reference limit for Creatinine is approximately 13% higher for people identified as African-American.      Creatinine, Ser  Date Value Ref Range Status  02/05/2022 1.01 0.76 - 1.27 mg/dL Final         Passed - Patient is not pregnant      Passed - Valid encounter within last 12 months    Recent Outpatient Visits           7 months ago Essential hypertension   [place] [facility] Marcine Matar, MD   8 months ago Mouth pain   [place] [facility] Marcine Matar, MD   11 months ago Bilateral hip pain   [place] [facility] Marcine Matar, MD   1 year ago Essential hypertension   [place] [facility] Marcine Matar, MD   1 year ago Essential hypertension   [place] [facility] Marcine Matar, MD       Future Appointments             In 4 weeks Marcine Matar, MD [facility]

## 2022-09-25 ENCOUNTER — Ambulatory Visit: Payer: Self-pay | Admitting: *Deleted

## 2022-09-25 ENCOUNTER — Other Ambulatory Visit: Payer: Self-pay

## 2022-09-25 ENCOUNTER — Ambulatory Visit
Admission: EM | Admit: 2022-09-25 | Discharge: 2022-09-25 | Disposition: A | Discharge status: Home / Self Care | Payer: Medicaid Other | Attending: Physician Assistant | Admitting: Physician Assistant

## 2022-09-25 DIAGNOSIS — K089 Disorder of teeth and supporting structures, unspecified: Secondary | ICD-10-CM | POA: Diagnosis not present

## 2022-09-25 DIAGNOSIS — G8929 Other chronic pain: Secondary | ICD-10-CM

## 2022-09-25 MED ORDER — METOPROLOL SUCCINATE ER 50 MG PO TB24
50.0000 mg | ORAL_TABLET | Freq: Every day | ORAL | 0 refills | Status: DC
  Filled 2022-09-25: qty 30, 30d supply, fill #0

## 2022-09-25 MED ORDER — ROSUVASTATIN CALCIUM 20 MG PO TABS
20.0000 mg | ORAL_TABLET | Freq: Every day | ORAL | 0 refills | Status: DC
  Filled 2022-09-25: qty 30, 30d supply, fill #0

## 2022-09-25 MED ORDER — HYDROCHLOROTHIAZIDE 25 MG PO TABS
25.0000 mg | ORAL_TABLET | Freq: Every day | ORAL | 0 refills | Status: DC
  Filled 2022-09-25: qty 30, 30d supply, fill #0

## 2022-09-25 MED ORDER — AMLODIPINE BESYLATE 10 MG PO TABS
10.0000 mg | ORAL_TABLET | Freq: Every day | ORAL | 0 refills | Status: DC
  Filled 2022-09-25: qty 30, 30d supply, fill #0

## 2022-09-25 MED ORDER — AMOXICILLIN 500 MG PO CAPS
500.0000 mg | ORAL_CAPSULE | Freq: Three times a day (TID) | ORAL | 0 refills | Status: AC
  Filled 2022-09-25: qty 30, 10d supply, fill #0

## 2022-09-25 NOTE — Telephone Encounter (Signed)
  Chief Complaint: elevated BP and tooth pain  Symptoms: BP 145/86 P 54. Rechecked for BP 132/78 P 58. Tooth pain upper and lower teeth  Frequency: today  Pertinent Negatives: Patient denies chest pain no fever not weakness on either side of body  Disposition: [] ED /[] Urgent Care (no appt availability in office) / [] Appointment(In office/virtual)/ []  [place] [facility]/ [] Home Care/ [] Refused Recommended Disposition /[x] [place] [facility]/ []  Follow-up with PCP Additional Notes:   Called in to request antibiotics for mouth. Reports he is now approved for SSI disability and has medicaid. Patient report financial strain for gas for transportation and requesting if medication can be sent to pharmacy due to he is awaiting refills for BP meds now. Future appt with PCP 10/22/22. Please advise . Recommended mobile bus for teeth pain and possible antibiotics.        Reason for Disposition  [1] Systolic BP  >= 130 OR Diastolic >= 80 AND [2] taking BP medications  Answer Assessment - Initial Assessment Questions 1. BLOOD PRESSURE: "What is the blood pressure?" "Did you take at least two measurements 5 minutes apart?"     BP145/86 54, rechecked for BP 132/78 P 58. 2. ONSET: "When did you take your blood pressure?"     Now  3. HOW: "How did you take your blood pressure?" (e.g., automatic home BP monitor, visiting nurse)     Home  BP monitor   4. HISTORY: "Do you have a history of high blood pressure?"     Hx  5. MEDICINES: "Are you taking any medicines for blood pressure?" "Have you missed any doses recently?"     Yes . Has not taken in approx 1 week  6. OTHER SYMPTOMS: "Do you have any symptoms?" (e.g., blurred vision, chest pain, difficulty breathing, headache, weakness)     Mouth pain due to tooth pain.  7. PREGNANCY: "Is there any chance you are pregnant?" "When was your last menstrual period?"     na  Protocols used: Blood Pressure - High-A-AH

## 2022-09-25 NOTE — ED Triage Notes (Signed)
"  Past few days working on getting my PCP to refill my BP meds, they are working on that". "I also asked Dr. Laural Benes to call in antibiotics for teeth abscess/infection, she declined". Currently have "bad teeth" and have had for years. "A lot of my teeth are broke off and at the gum line". Pain is continual "and a lot". No fever.

## 2022-09-26 NOTE — Telephone Encounter (Signed)
Patient was seen at [facility] for dental concerns.  Medication for BP was sent to pharmacy by PCP.

## 2022-09-27 ENCOUNTER — Other Ambulatory Visit: Payer: Self-pay

## 2022-10-04 NOTE — ED Provider Notes (Signed)
[name]    CSN: [id number] Arrival date & time: 09/25/22  1310      History   Chief Complaint Chief Complaint  Patient presents with   Dental Pain    HPI Dustin [name] is a 62 y.o. male.   Patient here today for evaluation of possible dental abscess.  He has had several in the past and reports he called for refill of antibiotic from primary care who refused at this time.  He states that he does have some broken teeth and that is causing some pain as well.  He denies any fever.  The history is provided by the patient.  Dental Pain Associated symptoms: no fever     Past Medical History:  Diagnosis Date   Arthritis    DDD (degenerative disc disease)    GERD (gastroesophageal reflux disease)    Gout    HLD (hyperlipidemia)    Hypertension     Patient Active Problem List   Diagnosis Date Noted   Obesity (BMI 30.0-34.9) 03/10/2020   Influenza vaccination declined 11/11/2019   Polyarthritis 11/11/2019   HLD (hyperlipidemia)    Chronic cough 07/23/2019   Tobacco dependence 07/23/2019   Chest pain in adult 07/23/2019   OSA (obstructive sleep apnea) 05/15/2016   Neck pain 03/05/2016   Low back pain 03/05/2016   DDD (degenerative disc disease), lumbosacral 01/31/2016   Acute meniscal tear of left knee 07/05/2014   Left knee pain 06/16/2014   Essential hypertension 06/16/2014   Other headache syndrome 02/17/2014   Chronic pain syndrome 02/17/2014   Medication overuse headache 05/06/2013   Dental caries 04/06/2013   Headache(784.0) 04/06/2013    Past Surgical History:  Procedure Laterality Date   ANTERIOR CRUCIATE LIGAMENT REPAIR     NECK SURGERY         Home Medications    Prior to Admission medications   Medication Sig Start Date End Date Taking? Authorizing Provider  amLODipine (NORVASC) 10 MG tablet Take 1 tablet (10 mg total) by mouth daily. 09/25/22  Yes Marcine Matar, MD  amoxicillin (AMOXIL) 500 MG capsule Take 1 capsule (500  mg total) by mouth 3 (three) times daily for 10 days. 09/25/22 10/05/22 Yes Tomi Bamberger, PA-C  aspirin EC 81 MG tablet Take 1 tablet (81 mg total) by mouth daily. Swallow whole. 07/23/19  Yes Marcine Matar, MD  gabapentin (NEURONTIN) 300 MG capsule Take 1 capsule (300 mg total) by mouth 2 (two) times daily. 01/10/22  Yes Marcine Matar, MD  hydrochlorothiazide (HYDRODIURIL) 25 MG tablet Take 1 tablet (25 mg total) by mouth daily.Please keep upcoming appt for more refills. 09/25/22  Yes Marcine Matar, MD  metoprolol succinate (TOPROL-XL) 50 MG 24 hr tablet Take 1 tablet (50 mg total) by mouth daily.Please keep upcoming appt for more refills. 09/25/22  Yes Marcine Matar, MD  rosuvastatin (CRESTOR) 20 MG tablet Take 1 tablet (20 mg total) by mouth daily.Please keep upcoming appt for more refills. 09/25/22  Yes Marcine Matar, MD  acetaminophen-codeine (TYLENOL #3) 300-30 MG tablet Take 1 tablet by mouth 2 (two) times daily as needed for moderate pain. 05/08/22   Hoy Register, MD  amitriptyline (ELAVIL) 50 MG tablet TAKE 1 TABLET (50 MG TOTAL) BY MOUTH AT BEDTIME AS NEEDED. FOR SLEEP Patient taking differently: Take 50 mg by mouth at bedtime. 08/02/21   Marcine Matar, MD  meloxicam (MOBIC) 15 MG tablet Take 1 tablet (15 mg total) by mouth daily.  02/05/22   Marcine Matar, MD  nicotine (NICODERM CQ - DOSED IN MG/24 HOURS) 21 mg/24hr patch Place 1 patch (21 mg total) onto the skin daily. 02/05/22   Marcine Matar, MD  olopatadine (PATANOL) 0.1 % ophthalmic solution Place 1 drop into both eyes 2 (two) times daily. 08/02/21   Marcine Matar, MD    Family History Family History  Problem Relation Age of Onset   Rheum arthritis Mother    Peripheral Artery Disease Mother    Cancer Father    Diabetes Father     Social History Social History   Tobacco Use   Smoking status: Every Day    Current packs/day: 0.50    Types: Cigarettes   Smokeless tobacco: Never  Vaping  Use   Vaping status: Never Used  Substance Use Topics   Alcohol use: Yes    Alcohol/week: 0.0 standard drinks of alcohol    Comment: occasionally- "3 shots a month"   Drug use: No     Allergies   Patient has no known allergies.   Review of Systems Review of Systems  Constitutional:  Negative for chills and fever.  HENT:  Positive for dental problem.   Eyes:  Negative for discharge and redness.  Respiratory:  Negative for shortness of breath.   Gastrointestinal:  Negative for nausea and vomiting.  Neurological:  Negative for numbness.     Physical Exam Triage Vital Signs ED Triage Vitals  Encounter Vitals Group     BP 09/25/22 1335 129/85     Systolic BP Percentile --      Diastolic BP Percentile --      Pulse Rate 09/25/22 1335 (!) 58     Resp --      Temp 09/25/22 1335 98.1 F (36.7 C)     Temp Source 09/25/22 1335 Oral     SpO2 09/25/22 1335 98 %     Weight 09/25/22 1333 230 lb (104.3 kg)     Height 09/25/22 1333 6\' 2"  (1.88 m)     Head Circumference --      Peak Flow --      Pain Score 09/25/22 1333 10     Pain Loc --      Pain Education --      Exclude from Growth Chart --    No data found.  Updated Vital Signs BP 129/85 (BP Location: Left Arm)   Pulse (!) 59   Temp 98.1 F (36.7 C) (Oral)   Ht 6\' 2"  (1.88 m)   Wt 230 lb (104.3 kg)   SpO2 98%   BMI 29.53 kg/m      Physical Exam Vitals and nursing note reviewed.  Constitutional:      General: He is not in acute distress.    Appearance: Normal appearance. He is not ill-appearing.  HENT:     Head: Normocephalic and atraumatic.     Nose: Nose normal. No congestion or rhinorrhea.     Mouth/Throat:     Mouth: Mucous membranes are moist.     Pharynx: Oropharynx is clear.     Comments: Poor dentition throughout with erythematous and swollen gum lines Eyes:     Conjunctiva/sclera: Conjunctivae normal.  Cardiovascular:     Rate and Rhythm: Normal rate.  Pulmonary:     Effort: Pulmonary effort is  normal. No respiratory distress.  Neurological:     Mental Status: He is alert.  Psychiatric:        Mood and Affect:  Mood normal.        Behavior: Behavior normal.        Thought Content: Thought content normal.      UC Treatments / Results  Labs (all labs ordered are listed, but only abnormal results are displayed) Labs Reviewed - No data to display  EKG   Radiology No results found.  Procedures Procedures (including critical care time)  Medications Ordered in UC Medications - No data to display  Initial Impression / Assessment and Plan / UC Course  I have reviewed the triage vital signs and the nursing notes.  Pertinent labs & imaging results that were available during my care of the patient were reviewed by me and considered in my medical decision making (see chart for details).    Will treat with amoxicillin to cover dental abscess.  Recommended follow-up if no gradual improvement and advised patient to see dentistry as soon as possible.  Patient expresses understanding.  Final Clinical Impressions(s) / UC Diagnoses   Final diagnoses:  Chronic dental pain   Discharge Instructions   None    ED Prescriptions     Medication Sig Dispense Auth. Provider   amoxicillin (AMOXIL) 500 MG capsule Take 1 capsule (500 mg total) by mouth 3 (three) times daily for 10 days. 30 capsule Tomi Bamberger, PA-C      PDMP not reviewed this encounter.   Tomi Bamberger, PA-C 10/04/22 1728

## 2022-10-22 ENCOUNTER — Telehealth: Payer: Self-pay

## 2022-10-22 ENCOUNTER — Other Ambulatory Visit: Payer: Self-pay

## 2022-10-22 ENCOUNTER — Ambulatory Visit: Payer: Medicaid Other | Attending: Internal Medicine | Admitting: Internal Medicine

## 2022-10-22 VITALS — BP 122/76 | HR 66 | Temp 98.3°F | Ht 73.0 in | Wt 233.4 lb

## 2022-10-22 DIAGNOSIS — E785 Hyperlipidemia, unspecified: Secondary | ICD-10-CM | POA: Diagnosis not present

## 2022-10-22 DIAGNOSIS — I10 Essential (primary) hypertension: Secondary | ICD-10-CM

## 2022-10-22 DIAGNOSIS — M25561 Pain in right knee: Secondary | ICD-10-CM

## 2022-10-22 DIAGNOSIS — R634 Abnormal weight loss: Secondary | ICD-10-CM

## 2022-10-22 DIAGNOSIS — R635 Abnormal weight gain: Secondary | ICD-10-CM

## 2022-10-22 DIAGNOSIS — Z122 Encounter for screening for malignant neoplasm of respiratory organs: Secondary | ICD-10-CM

## 2022-10-22 DIAGNOSIS — Z2821 Immunization not carried out because of patient refusal: Secondary | ICD-10-CM

## 2022-10-22 DIAGNOSIS — F17200 Nicotine dependence, unspecified, uncomplicated: Secondary | ICD-10-CM

## 2022-10-22 DIAGNOSIS — Z1211 Encounter for screening for malignant neoplasm of colon: Secondary | ICD-10-CM

## 2022-10-22 DIAGNOSIS — R7303 Prediabetes: Secondary | ICD-10-CM

## 2022-10-22 DIAGNOSIS — M170 Bilateral primary osteoarthritis of knee: Secondary | ICD-10-CM

## 2022-10-22 MED ORDER — MELOXICAM 15 MG PO TABS
15.0000 mg | ORAL_TABLET | Freq: Every day | ORAL | 1 refills | Status: AC
  Filled 2022-10-22: qty 30, 30d supply, fill #0
  Filled 2022-11-25: qty 30, 30d supply, fill #1
  Filled 2023-01-09: qty 30, 30d supply, fill #2
  Filled 2023-04-10: qty 30, 30d supply, fill #3
  Filled 2023-07-09: qty 30, 30d supply, fill #4

## 2022-10-22 MED ORDER — GABAPENTIN 300 MG PO CAPS
300.0000 mg | ORAL_CAPSULE | Freq: Two times a day (BID) | ORAL | 2 refills | Status: AC
  Filled 2022-10-22: qty 60, 30d supply, fill #0
  Filled 2023-04-10: qty 60, 30d supply, fill #1
  Filled 2023-05-13: qty 60, 30d supply, fill #2
  Filled 2023-07-09: qty 60, 30d supply, fill #3

## 2022-10-22 MED ORDER — AMITRIPTYLINE HCL 50 MG PO TABS
50.0000 mg | ORAL_TABLET | Freq: Every day | ORAL | 2 refills | Status: DC
  Filled 2022-10-22: qty 30, 30d supply, fill #0

## 2022-10-22 MED ORDER — ACETAMINOPHEN-CODEINE 300-30 MG PO TABS
1.0000 | ORAL_TABLET | Freq: Two times a day (BID) | ORAL | 0 refills | Status: DC | PRN
Start: 2022-10-22 — End: 2023-02-20
  Filled 2022-10-22: qty 10, 5d supply, fill #0

## 2022-10-22 NOTE — Progress Notes (Unsigned)
Patient ID: Dustin [name], male    DOB: [birth year]  MRN: [id number]  CC: chronic ds management  Subjective: Dustin [name] is a 62 y.o. male who presents for chronic ds management. His concerns today include:  Pt with hx of HTN, HL, OA, obesity, DD LS spine, false positive HIV testing 2018, tob dep.     Last seen 01/2022.  C/o pain RT knee.  Thinks Dustin [name] torn his ACL. Dustin [name] was stepping up and turning to go in his door from the porch 4 days ago.  Knee and leg buckle in and Dustin [name] felt Dustin [name] tore something.  Using Ice pack and keeping a brace on it.  Walking with a cane Had surgery on this knee 20 yrs ago for torn meniscus.  Requesting referral to orthopedics.  Trying to get to a dentist that accepts Medicaid.  HTN:  Should be on amlodipine 10 mg daily, HCTZ 25 mg daily and metoprolol 50 mg daily. Reports in past 3 mths BP has been ranging 110-116/70s.  If BP is low like this Dustin [name] holds off on taking his BP med.  Took BP meds one time when BP 120/70 and BP dropped low and felt dizzy.  Has not taken BP meds in past 2 days. I note that Dustin [name] is down 31 lbs since I last saw him 01/2022. Not eating as much as before due to his teeth.  No blood in stools or urine. No fever No swollen LN No chronic cough.  No feeling of being hot all the time.  No palpitations  HL:  taking and tolerating Crestor  Tob dep:  prescribed patches on last visit; using it and has cut back on smoking  Dustin [name] declines getting flu shot and shingles vaccine today. Patient Active Problem List   Diagnosis Date Noted   Obesity (BMI 30.0-34.9) 03/10/2020   Influenza vaccination declined 11/11/2019   Polyarthritis 11/11/2019   HLD (hyperlipidemia)    Chronic cough 07/23/2019   Tobacco dependence 07/23/2019   Chest pain in adult 07/23/2019   OSA (obstructive sleep apnea) 05/15/2016   Neck pain 03/05/2016   Low back pain 03/05/2016   DDD (degenerative disc disease), lumbosacral 01/31/2016   Acute meniscal tear of left knee 07/05/2014    Left knee pain 06/16/2014   Essential hypertension 06/16/2014   Other headache syndrome 02/17/2014   Chronic pain syndrome 02/17/2014   Medication overuse headache 05/06/2013   Dental caries 04/06/2013   Headache 04/06/2013     Current Outpatient Medications on File Prior to Visit  Medication Sig Dispense Refill   amLODipine (NORVASC) 10 MG tablet Take 1 tablet (10 mg total) by mouth daily. 30 tablet 0   aspirin EC 81 MG tablet Take 1 tablet (81 mg total) by mouth daily. Swallow whole. 100 tablet 1   nicotine (NICODERM CQ - DOSED IN MG/24 HOURS) 21 mg/24hr patch Place 1 patch (21 mg total) onto the skin daily. 28 patch 2   olopatadine (PATANOL) 0.1 % ophthalmic solution Place 1 drop into both eyes 2 (two) times daily. 5 mL 0   rosuvastatin (CRESTOR) 20 MG tablet Take 1 tablet (20 mg total) by mouth daily.Please keep upcoming appt for more refills. 30 tablet 0   No current facility-administered medications on file prior to visit.    No Known Allergies  Social History   Socioeconomic History   Marital status: Legally Separated    Spouse name: Not on file   Number of children: Not on file  Years of education: Not on file   Highest education level: Not on file  Occupational History   Not on file  Tobacco Use   Smoking status: Every Day    Current packs/day: 0.50    Types: Cigarettes   Smokeless tobacco: Never  Vaping Use   Vaping status: Never Used  Substance and Sexual Activity   Alcohol use: Yes    Alcohol/week: 0.0 standard drinks of alcohol    Comment: occasionally- "3 shots a month"   Drug use: No   Sexual activity: Not Currently    Partners: Female  Other Topics Concern   Not on file  Social History Narrative   Not on file   Social Determinants of Health   Financial Resource Strain: Not on file  Food Insecurity: Not on file  Transportation Needs: Not on file  Physical Activity: Not on file  Stress: Not on file  Social Connections: Not on file  Intimate  Partner Violence: Not on file    Family History  Problem Relation Age of Onset   Rheum arthritis Mother    Peripheral Artery Disease Mother    Cancer Father    Diabetes Father     Past Surgical History:  Procedure Laterality Date   ANTERIOR CRUCIATE LIGAMENT REPAIR     NECK SURGERY      ROS: Review of Systems Negative except as stated above  PHYSICAL EXAM: BP 122/76   Pulse 66   Temp 98.3 F (36.8 C)   Ht 6\' 1"  (1.854 m)   Wt 233 lb 6.4 oz (105.9 kg)   SpO2 97%   BMI 30.79 kg/m   Wt Readings from Last 3 Encounters:  10/22/22 233 lb 6.4 oz (105.9 kg)  09/25/22 230 lb (104.3 kg)  03/13/22 250 lb (113.4 kg)  ' Physical Exam   General appearance - alert, well appearing, and in no distress.  Patient is sitting in chair.  Unable to get on exam table Mental status - alert, oriented to person, place, and time.  Patient is very talkative. Neck - supple, no significant adenopathy Chest - clear to auscultation, no wheezes, rales or rhonchi, symmetric air entry Heart - normal rate, regular rhythm, normal S1, S2, no murmurs, rubs, clicks or gallops Musculoskeletal -right knee: Joint is enlarged.  No significant swelling of soft tissue noted.  No tenderness on palpation along the joint line.  Difficulty with flexion/extension. Extremities -no lower extremity edema.  Results for orders placed or performed in visit on 10/22/22  TSH+T4F+T3Free  Result Value Ref Range   TSH 0.856 0.450 - 4.500 uIU/mL   T3, Free 3.4 2.0 - 4.4 pg/mL   Free T4 1.43 0.82 - 1.77 ng/dL  Comprehensive metabolic panel  Result Value Ref Range   Glucose 89 70 - 99 mg/dL   BUN 12 8 - 27 mg/dL   Creatinine, Ser 1.61 0.76 - 1.27 mg/dL   eGFR 78 >09 UE/AVW/0.98   BUN/Creatinine Ratio 11 10 - 24   Sodium 144 134 - 144 mmol/L   Potassium 5.9 (H) 3.5 - 5.2 mmol/L   Chloride 102 96 - 106 mmol/L   CO2 24 20 - 29 mmol/L   Calcium 10.0 8.6 - 10.2 mg/dL   Total Protein 7.4 6.0 - 8.5 g/dL   Albumin 4.7 3.8 -  4.9 g/dL   Globulin, Total 2.7 1.5 - 4.5 g/dL   Bilirubin Total 0.2 0.0 - 1.2 mg/dL   Alkaline Phosphatase 82 44 - 121 IU/L   AST 20  0 - 40 IU/L   ALT 22 0 - 44 IU/L  CBC  Result Value Ref Range   WBC 7.2 3.4 - 10.8 x10E3/uL   RBC 5.48 4.14 - 5.80 x10E6/uL   Hemoglobin 16.8 13.0 - 17.7 g/dL   Hematocrit 09.8 11.9 - 51.0 %   MCV 93 79 - 97 fL   MCH 30.7 26.6 - 33.0 pg   MCHC 33.0 31.5 - 35.7 g/dL   RDW 14.7 82.9 - 56.2 %   Platelets 288 150 - 450 x10E3/uL  POCT glycosylated hemoglobin (Hb A1C)  Result Value Ref Range   Hemoglobin A1C 5.6 4.0 - 5.6 %   HbA1c POC (<> result, manual entry)     HbA1c, POC (prediabetic range)     HbA1c, POC (controlled diabetic range)         Latest Ref Rng & Units 10/22/2022    5:01 PM 02/05/2022    5:02 PM 11/07/2020    9:15 AM  CMP  Glucose 70 - 99 mg/dL 89  130  865   BUN 8 - 27 mg/dL 12  8  13    Creatinine 0.76 - 1.27 mg/dL 7.84  6.96  2.95   Sodium 134 - 144 mmol/L 144  139  139   Potassium 3.5 - 5.2 mmol/L 5.9  5.0  4.5   Chloride 96 - 106 mmol/L 102  100  100   CO2 20 - 29 mmol/L 24  21  23    Calcium 8.6 - 10.2 mg/dL 28.4  9.6  9.6   Total Protein 6.0 - 8.5 g/dL 7.4  7.4  7.4   Total Bilirubin 0.0 - 1.2 mg/dL 0.2  0.2  0.3   Alkaline Phos 44 - 121 IU/L 82  68  72   AST 0 - 40 IU/L 20  18  23    ALT 0 - 44 IU/L 22  18  30     Lipid Panel     Component Value Date/Time   CHOL 142 02/05/2022 1702   TRIG 111 02/05/2022 1702   HDL 53 02/05/2022 1702   CHOLHDL 2.7 02/05/2022 1702   CHOLHDL 4.0 01/31/2016 1047   VLDL 33 (H) 01/31/2016 1047   LDLCALC 69 02/05/2022 1702    CBC    Component Value Date/Time   WBC 7.2 10/22/2022 1701   WBC 10.7 01/31/2016 1047   RBC 5.48 10/22/2022 1701   RBC 5.14 01/31/2016 1047   HGB 16.8 10/22/2022 1701   HCT 50.9 10/22/2022 1701   PLT 288 10/22/2022 1701   MCV 93 10/22/2022 1701   MCH 30.7 10/22/2022 1701   MCH 31.3 01/31/2016 1047   MCHC 33.0 10/22/2022 1701   MCHC 33.6 01/31/2016 1047    RDW 12.1 10/22/2022 1701   LYMPHSABS 2.6 08/02/2021 1059   MONOABS 642 01/31/2016 1047   EOSABS 0.2 08/02/2021 1059   BASOSABS 0.1 08/02/2021 1059    ASSESSMENT AND PLAN: 1. Acute pain of right knee Patient with acute on chronic pain of the right knee.  Dustin [name] thinks Dustin [name] may have torn his ACL but does not have significant soft tissue swelling.  I have refilled his meloxicam.  After I left the room, patient requested from the RN of me to give a refill on Tylenol with codeine.  Advised that I would give a limited supply.  [place] controlled substance reporting system reviewed. - AMB referral to orthopedics - acetaminophen-codeine (TYLENOL #3) 300-30 MG tablet; Take 1 tablet by mouth 2 (two) times daily as  needed for moderate pain (pain score 4-6).  Dispense: 20 tablet; Refill: 0  2. Unexplained weight loss Concerning.  We will do some baseline blood test today including thyroid function and get him up-to-date with age-appropriate cancer screenings. - - TSH+T4F+T3Free - Comprehensive metabolic panel - CBC  3. Essential hypertension Blood pressure is actually good.  I suspect decrease in blood pressure is associated with weight loss.  We will have him stop the metoprolol and HCTZ.  Advised to continue amlodipine 10 mg but take only half a tablet daily.  4. Hyperlipidemia, unspecified hyperlipidemia type Continue Crestor. - POCT glycosylated hemoglobin (Hb A1C)  5. Arthritis of both knees - meloxicam (MOBIC) 15 MG tablet; Take 1 tablet (15 mg total) by mouth daily.  Dispense: 90 tablet; Refill: 1  6. Tobacco dependence Strongly advised to quit.  Dustin [name] has greater than 20-pack-year smoking history.  Dustin [name] is agreeable to lung cancer screening.  7. Influenza vaccination declined   8. Screening for colon cancer - Ambulatory referral to Gastroenterology  9. Screening for lung cancer - CT CHEST LUNG CA SCREEN LOW DOSE W/O CM; Future  10.  PreDM: A1c was checked by RN the previous history  of prediabetes 6 years ago.  His A1c today was 5.6. Not in range for preDM anymore  Patient was given the opportunity to ask questions.  Patient verbalized understanding of the plan and was able to repeat key elements of the plan.   This documentation was completed using Paediatric nurse.  Any transcriptional errors are unintentional.  Orders Placed This Encounter  Procedures   CT CHEST LUNG CA SCREEN LOW DOSE W/O CM   CBC   Comprehensive metabolic panel   [id number]   AMB referral to orthopedics   Ambulatory referral to Gastroenterology   POCT glycosylated hemoglobin (Hb A1C)     Requested Prescriptions   Signed Prescriptions Disp Refills   amitriptyline (ELAVIL) 50 MG tablet 30 tablet 2    Sig: Take 1 tablet (50 mg total) by mouth at bedtime.   gabapentin (NEURONTIN) 300 MG capsule 180 capsule 2    Sig: Take 1 capsule (300 mg total) by mouth 2 (two) times daily.   meloxicam (MOBIC) 15 MG tablet 90 tablet 1    Sig: Take 1 tablet (15 mg total) by mouth daily.   acetaminophen-codeine (TYLENOL #3) 300-30 MG tablet 20 tablet 0    Sig: Take 1 tablet by mouth 2 (two) times daily as needed for moderate pain (pain score 4-6).    Return in about 3 months (around 01/22/2023).  Jonah Blue, MD, FACP

## 2022-10-22 NOTE — Telephone Encounter (Signed)
Copied from CRM [id number]. Topic: General - Inquiry >> Oct 22, 2022 10:27 AM Haroldine Laws wrote: Reason for CRM: pt called saying he has new medicaid and wants to know if someone can talk to him about the financial benefits of his medicaid.  He is saying he needs referral for ortho.  [phone]

## 2022-10-23 ENCOUNTER — Telehealth: Payer: Self-pay | Admitting: Internal Medicine

## 2022-10-23 DIAGNOSIS — E875 Hyperkalemia: Secondary | ICD-10-CM

## 2022-10-23 LAB — COMPREHENSIVE METABOLIC PANEL
ALT: 22 [IU]/L (ref 0–44)
AST: 20 [IU]/L (ref 0–40)
Albumin: 4.7 g/dL (ref 3.8–4.9)
Alkaline Phosphatase: 82 [IU]/L (ref 44–121)
BUN/Creatinine Ratio: 11 (ref 10–24)
BUN: 12 mg/dL (ref 8–27)
Bilirubin Total: 0.2 mg/dL (ref 0.0–1.2)
CO2: 24 mmol/L (ref 20–29)
Calcium: 10 mg/dL (ref 8.6–10.2)
Chloride: 102 mmol/L (ref 96–106)
Creatinine, Ser: 1.09 mg/dL (ref 0.76–1.27)
Globulin, Total: 2.7 g/dL (ref 1.5–4.5)
Glucose: 89 mg/dL (ref 70–99)
Potassium: 5.9 mmol/L — ABNORMAL HIGH (ref 3.5–5.2)
Sodium: 144 mmol/L (ref 134–144)
Total Protein: 7.4 g/dL (ref 6.0–8.5)
eGFR: 78 mL/min/{1.73_m2} (ref >59)

## 2022-10-23 LAB — POCT GLYCOSYLATED HEMOGLOBIN (HGB A1C): Hemoglobin A1C: 5.6 % (ref 4.0–5.6)

## 2022-10-23 LAB — CBC
Hematocrit: 50.9 % (ref 37.5–51.0)
Hemoglobin: 16.8 g/dL (ref 13.0–17.7)
MCH: 30.7 pg (ref 26.6–33.0)
MCHC: 33 g/dL (ref 31.5–35.7)
MCV: 93 fL (ref 79–97)
Platelets: 288 10*3/uL (ref 150–450)
RBC: 5.48 x10E6/uL (ref 4.14–5.80)
RDW: 12.1 % (ref 11.6–15.4)
WBC: 7.2 10*3/uL (ref 3.4–10.8)

## 2022-10-23 LAB — TSH+T4F+T3FREE
Free T4: 1.43 ng/dL (ref 0.82–1.77)
T3, Free: 3.4 pg/mL (ref 2.0–4.4)
TSH: 0.856 u[IU]/mL (ref 0.450–4.500)

## 2022-10-23 NOTE — Telephone Encounter (Signed)
Pt seen yesterday and referral was submitted to ortho as was discussed. He needs to speak with his insurance directly to discuss questions that he has about his Medicaid coverage.  I would not be able to assist with that.

## 2022-10-23 NOTE — Telephone Encounter (Signed)
Phone call placed to patient x 2 this morning to go over lab results.  I left a message both times informing him of who I am and that I was calling to go over lab results.  I requested that he give [place] a call back.  When patient calls back, please let him know that his potassium level is elevated.  High potassium can cause the heart to go into abnormal rhythms.  Currently he is not on any medications that would cause elevated potassium level.  Please cut back on potassium rich foods like bananas, oranges and orange juice.  Please return to the lab on Thursday afternoon or Friday morning to have potassium level rechecked. Otherwise, blood cell counts are normal. He is not diabetic or prediabetic. Thyroid level normal. Liver function test normal.  Kidney function good.

## 2022-10-23 NOTE — Telephone Encounter (Signed)
Call placed to patient unable to reach message left on VM.   

## 2022-10-23 NOTE — Telephone Encounter (Signed)
Called but no answer. LVM to call back.  

## 2022-10-23 NOTE — Telephone Encounter (Signed)
Called but no answer. LVM to call back.

## 2022-10-24 ENCOUNTER — Ambulatory Visit (INDEPENDENT_AMBULATORY_CARE_PROVIDER_SITE_OTHER): Payer: Medicaid Other | Admitting: Orthopaedic Surgery

## 2022-10-24 DIAGNOSIS — M25561 Pain in right knee: Secondary | ICD-10-CM

## 2022-10-24 DIAGNOSIS — M65961 Unspecified synovitis and tenosynovitis, right lower leg: Secondary | ICD-10-CM | POA: Diagnosis not present

## 2022-10-24 DIAGNOSIS — G8929 Other chronic pain: Secondary | ICD-10-CM

## 2022-10-24 NOTE — Telephone Encounter (Signed)
Called but no answer. LVM to call back.

## 2022-10-24 NOTE — Telephone Encounter (Signed)
Called but no answer. LVM to call back.  

## 2022-10-24 NOTE — Progress Notes (Signed)
Office Visit Note   Patient: Dustin [name]           Date of Birth: 06-18-1960           MRN: [id number] Visit Date: 10/24/2022              Requested by: Marcine Matar, MD [address] [place] [address] [place],  [place] [address] PCP: Marcine Matar, MD   Assessment & Plan: Visit Diagnoses:  1. Synovitis of right knee     Plan: Patient had previous medial meniscectomy with his ACL reconstruction.  X-rays demonstrate medial joint line narrowing and small spurs and some patellofemoral degenerative changes.  Screws and tunnels are in good position and his graft appears intact on physical exam.  Injection performed he will let us know if he has ongoing problems.  Follow-Up Instructions: No follow-ups on file.   Orders:  Orders Placed This Encounter  Procedures   Large Joint Inj   No orders of the defined types were placed in this encounter.     Procedures: Large Joint Inj: R knee on 10/24/2022 10:46 AM Indications: pain and joint swelling Details: 22 G 1.5 in needle, anterolateral approach  Arthrogram: No  Medications: 40 mg methylPREDNISolone acetate 40 MG/ML; 0.5 mL lidocaine 1 %; 4 mL bupivacaine 0.25 % Outcome: tolerated well, no immediate complications Procedure, treatment alternatives, risks and benefits explained, specific risks discussed. Consent was given by the patient. Immediately prior to procedure a time out was called to verify the correct patient, procedure, equipment, support staff and site/side marked as required. Patient was prepped and draped in the usual sterile fashion.       Clinical Data: No additional findings.   Subjective: No chief complaint on file.   HPI 62 year old male had ACL reconstruction by Dr. Kristeen Miss and Dr. Royston Sinner many years ago.  Recently twisted his knee stepping with increased pain and acute swelling.  He has used ice had to wear a knee immobilizer and Tylenol 3 he has been able to drive taking the immobilizer  off.  He denies any groin pain.  He has had chronic back problems.  Previous cervical stenosis with cervical fusion which is solid.  Review of Systems on chronic pain management with gabapentin.  Occasional Tylenol 3 less than 10/month.  Plus for tobacco dependence all systems noncontributory to HPI.   Objective: Vital Signs: There were no vitals taken for this visit.  Physical Exam Constitutional:      Appearance: He is well-developed.  HENT:     Head: Normocephalic and atraumatic.     Right Ear: External ear normal.     Left Ear: External ear normal.  Eyes:     Pupils: Pupils are equal, round, and reactive to light.  Neck:     Thyroid: No thyromegaly.     Trachea: No tracheal deviation.  Cardiovascular:     Rate and Rhythm: Normal rate.  Pulmonary:     Effort: Pulmonary effort is normal.     Breath sounds: No wheezing.  Abdominal:     General: Bowel sounds are normal.     Palpations: Abdomen is soft.  Musculoskeletal:     Cervical back: Neck supple.  Skin:    General: Skin is warm and dry.     Capillary Refill: Capillary refill takes less than 2 seconds.  Neurological:     Mental Status: He is alert and oriented to person, place, and time.  Psychiatric:  Behavior: Behavior normal.        Thought Content: Thought content normal.        Judgment: Judgment normal.     Ortho Exam ACL reconstruction scars negative anterior drawer negative Lachman.  2+ knee effusion more medial lateral joint line tenderness positive patellofemoral crepitus negative patellar subluxation negative logroll hips pulses are normal.  Specialty Comments:  No specialty comments available.  Imaging: No results found.   PMFS History: Patient Active Problem List   Diagnosis Date Noted   Synovitis of right knee 10/24/2022   Obesity (BMI 30.0-34.9) 03/10/2020   Influenza vaccination declined 11/11/2019   Polyarthritis 11/11/2019   HLD (hyperlipidemia)    Chronic cough 07/23/2019    Tobacco dependence 07/23/2019   Chest pain in adult 07/23/2019   OSA (obstructive sleep apnea) 05/15/2016   Neck pain 03/05/2016   Low back pain 03/05/2016   DDD (degenerative disc disease), lumbosacral 01/31/2016   Acute meniscal tear of left knee 07/05/2014   Left knee pain 06/16/2014   Essential hypertension 06/16/2014   Other headache syndrome 02/17/2014   Chronic pain syndrome 02/17/2014   Medication overuse headache 05/06/2013   Dental caries 04/06/2013   Headache 04/06/2013   Past Medical History:  Diagnosis Date   Arthritis    DDD (degenerative disc disease)    GERD (gastroesophageal reflux disease)    Gout    HLD (hyperlipidemia)    Hypertension     Family History  Problem Relation Age of Onset   Rheum arthritis Mother    Peripheral Artery Disease Mother    Cancer Father    Diabetes Father     Past Surgical History:  Procedure Laterality Date   ANTERIOR CRUCIATE LIGAMENT REPAIR     NECK SURGERY     Social History   Occupational History   Not on file  Tobacco Use   Smoking status: Every Day    Current packs/day: 0.50    Types: Cigarettes   Smokeless tobacco: Never  Vaping Use   Vaping status: Never Used  Substance and Sexual Activity   Alcohol use: Yes    Alcohol/week: 0.0 standard drinks of alcohol    Comment: occasionally- "3 shots a month"   Drug use: No   Sexual activity: Not Currently    Partners: Female

## 2022-10-25 MED ORDER — LIDOCAINE HCL 1 % IJ SOLN
0.5000 mL | INTRAMUSCULAR | Status: AC | PRN
Start: 2022-10-24 — End: 2022-10-24
  Administered 2022-10-24: .5 mL

## 2022-10-25 MED ORDER — METHYLPREDNISOLONE ACETATE 40 MG/ML IJ SUSP
40.0000 mg | INTRAMUSCULAR | Status: AC | PRN
Start: 2022-10-24 — End: 2022-10-24
  Administered 2022-10-24: 40 mg via INTRA_ARTICULAR

## 2022-10-25 MED ORDER — BUPIVACAINE HCL 0.25 % IJ SOLN
4.0000 mL | INTRAMUSCULAR | Status: AC | PRN
Start: 2022-10-24 — End: 2022-10-24
  Administered 2022-10-24: 4 mL via INTRA_ARTICULAR

## 2022-10-25 NOTE — Telephone Encounter (Signed)
Left message on voicemail to return call.

## 2022-10-25 NOTE — Telephone Encounter (Signed)
Letter sent.

## 2022-10-25 NOTE — Telephone Encounter (Signed)
FYI. Called but no answer. LVM to call back. Third and final attempt.

## 2022-11-04 ENCOUNTER — Other Ambulatory Visit: Payer: Medicaid Other

## 2022-11-05 ENCOUNTER — Other Ambulatory Visit (INDEPENDENT_AMBULATORY_CARE_PROVIDER_SITE_OTHER): Payer: Medicaid Other

## 2022-11-05 ENCOUNTER — Ambulatory Visit (INDEPENDENT_AMBULATORY_CARE_PROVIDER_SITE_OTHER): Payer: Medicaid Other | Admitting: Orthopaedic Surgery

## 2022-11-05 ENCOUNTER — Encounter: Payer: Self-pay | Admitting: Orthopaedic Surgery

## 2022-11-05 VITALS — BP 143/90 | HR 72

## 2022-11-05 DIAGNOSIS — M25511 Pain in right shoulder: Secondary | ICD-10-CM

## 2022-11-05 NOTE — Progress Notes (Signed)
Office Visit Note   Patient: Dustin [name]           Date of Birth: [birth year]           MRN: [id number] Visit Date: 11/05/2022              Requested by: Marcine Matar, MD [address] [place] [address] [place],  [place] [address] PCP: Marcine Matar, MD   Assessment & Plan: Visit Diagnoses:  1. Acute pain of right shoulder     Plan: Patient is improving we will recheck him in 2 weeks and then we can make a decision about subacromial injection versus diagnostic MRI imaging.  Follow-Up Instructions: No follow-ups on file.   Orders:  Orders Placed This Encounter  Procedures   XR Shoulder Right   No orders of the defined types were placed in this encounter.     Procedures: No procedures performed   Clinical Data: No additional findings.   Subjective: Chief Complaint  Patient presents with   Right Shoulder - Pain    HPI 62 year old male seen with acute right shoulder pain after he was just turning a crank chainsaw and states that grind did or jammed and a cord only came about halfway up with sharp onset of right shoulder pain.  He states since that time his shoulder has gotten slightly better still has pain with outstretched reaching.  Abduction to the side is painful he can get his arm over his head and flexion.  Painful with the pouring water into a coffee pot no numbness or tingling in his hand.  He is use Tylenol and gabapentin absorbing Junior cream and rest.  Patient states he is requesting injection which has worked well before.  Review of Systems all other systems noncontributory to HPI.   Objective: Vital Signs: BP (!) 143/90   Pulse 72   Physical Exam Constitutional:      Appearance: He is well-developed.  HENT:     Head: Normocephalic and atraumatic.     Right Ear: External ear normal.     Left Ear: External ear normal.  Eyes:     Pupils: Pupils are equal, round, and reactive to light.  Neck:     Thyroid: No thyromegaly.     Trachea: No  tracheal deviation.  Cardiovascular:     Rate and Rhythm: Normal rate.  Pulmonary:     Effort: Pulmonary effort is normal.     Breath sounds: No wheezing.  Abdominal:     General: Bowel sounds are normal.     Palpations: Abdomen is soft.  Musculoskeletal:     Cervical back: Neck supple.  Skin:    General: Skin is warm and dry.     Capillary Refill: Capillary refill takes less than 2 seconds.  Neurological:     Mental Status: He is alert and oriented to person, place, and time.  Psychiatric:        Behavior: Behavior normal.        Thought Content: Thought content normal.        Judgment: Judgment normal.     Ortho Exam positive impingement right shoulder some weakness with the abduction.  Specialty Comments:  No specialty comments available.  Imaging: No results found.   PMFS History: Patient Active Problem List   Diagnosis Date Noted   Synovitis of right knee 10/24/2022   Obesity (BMI 30.0-34.9) 03/10/2020   Influenza vaccination declined 11/11/2019   Polyarthritis 11/11/2019   HLD (hyperlipidemia)  Chronic cough 07/23/2019   Tobacco dependence 07/23/2019   Chest pain in adult 07/23/2019   OSA (obstructive sleep apnea) 05/15/2016   Neck pain 03/05/2016   Low back pain 03/05/2016   DDD (degenerative disc disease), lumbosacral 01/31/2016   Acute meniscal tear of left knee 07/05/2014   Left knee pain 06/16/2014   Essential hypertension 06/16/2014   Other headache syndrome 02/17/2014   Chronic pain syndrome 02/17/2014   Medication overuse headache 05/06/2013   Dental caries 04/06/2013   Headache 04/06/2013   Past Medical History:  Diagnosis Date   Arthritis    DDD (degenerative disc disease)    GERD (gastroesophageal reflux disease)    Gout    HLD (hyperlipidemia)    Hypertension     Family History  Problem Relation Age of Onset   Rheum arthritis Mother    Peripheral Artery Disease Mother    Cancer Father    Diabetes Father     Past Surgical  History:  Procedure Laterality Date   ANTERIOR CRUCIATE LIGAMENT REPAIR     NECK SURGERY     Social History   Occupational History   Not on file  Tobacco Use   Smoking status: Every Day    Current packs/day: 0.50    Types: Cigarettes   Smokeless tobacco: Never  Vaping Use   Vaping status: Never Used  Substance and Sexual Activity   Alcohol use: Yes    Alcohol/week: 0.0 standard drinks of alcohol    Comment: occasionally- "3 shots a month"   Drug use: No   Sexual activity: Not Currently    Partners: Female

## 2022-11-07 ENCOUNTER — Ambulatory Visit: Payer: Medicaid Other | Attending: Internal Medicine

## 2022-11-07 DIAGNOSIS — E875 Hyperkalemia: Secondary | ICD-10-CM

## 2022-11-08 LAB — POTASSIUM: Potassium: 5.1 mmol/L (ref 3.5–5.2)

## 2022-11-13 ENCOUNTER — Telehealth: Payer: Self-pay | Admitting: Internal Medicine

## 2022-11-13 NOTE — Telephone Encounter (Signed)
Let patient know that I have written a letter verifying that he was seen by me on 10/22/2022 for acute knee pain/injury.  However I did not request that he be excused from jury duty.  Request to be excused from jury duty needs to be done before the date not after.  Can pick up letter tomorrow.

## 2022-11-13 NOTE — Telephone Encounter (Signed)
Call placed to patient unable to reach message left on VM.   

## 2022-11-13 NOTE — Telephone Encounter (Signed)
Pt. Given lab results, verbalizes understanding. States he needs a note to be excused from jury duty due to his diagnosis. Needs as soon as possible. Supposed to appear 11/19/22. Please advise pt.

## 2022-11-13 NOTE — Telephone Encounter (Signed)
Thank you! I will let the patient know.

## 2022-11-13 NOTE — Telephone Encounter (Signed)
Spoke with patient.  patient states he missed Dustin [name] duty date because He had in juried his knee and he totally forgot about it . He is requesting a letter stating the he was seen here on 10/15 due to a knee injury. He also said he was going to ask Dr. Ophelia Charter as well.  He said that he need it by 11/18/2022 . He has to be in court on 11/19/2022 for failure to answer call to  jury duty.

## 2022-11-18 ENCOUNTER — Telehealth: Payer: Self-pay

## 2022-11-18 NOTE — Telephone Encounter (Signed)
Copied from CRM [id number]. Topic: General - Other >> Nov 18, 2022  9:28 AM Macon Large wrote: Reason for CRM: Pt stated that he missed a jury summons and would like to get a copy of the after visit summary reporting his physical injuries to turn in to the court. Pt requests that this be done today because he has to go to court tomorrow. Cb# [id number]

## 2022-11-19 ENCOUNTER — Other Ambulatory Visit: Payer: Self-pay | Admitting: Internal Medicine

## 2022-11-19 ENCOUNTER — Ambulatory Visit (INDEPENDENT_AMBULATORY_CARE_PROVIDER_SITE_OTHER): Payer: Medicaid Other | Admitting: Orthopaedic Surgery

## 2022-11-19 ENCOUNTER — Other Ambulatory Visit: Payer: Self-pay

## 2022-11-19 DIAGNOSIS — M25511 Pain in right shoulder: Secondary | ICD-10-CM

## 2022-11-19 DIAGNOSIS — I10 Essential (primary) hypertension: Secondary | ICD-10-CM

## 2022-11-19 DIAGNOSIS — M7541 Impingement syndrome of right shoulder: Secondary | ICD-10-CM

## 2022-11-19 DIAGNOSIS — E782 Mixed hyperlipidemia: Secondary | ICD-10-CM

## 2022-11-19 MED ORDER — AMLODIPINE BESYLATE 10 MG PO TABS
10.0000 mg | ORAL_TABLET | Freq: Every day | ORAL | 0 refills | Status: DC
  Filled 2022-11-19: qty 30, 30d supply, fill #0

## 2022-11-19 MED ORDER — ROSUVASTATIN CALCIUM 20 MG PO TABS
20.0000 mg | ORAL_TABLET | Freq: Every day | ORAL | 1 refills | Status: DC
  Filled 2022-11-19: qty 90, 90d supply, fill #0
  Filled 2023-02-21: qty 90, 90d supply, fill #1

## 2022-11-19 NOTE — Progress Notes (Unsigned)
   Office Visit Note   Patient: Dustin [name]           Date of Birth: 30-Jul-1960           MRN: [id number] Visit Date: 11/19/2022              Requested by: Marcine Matar, MD [address] [address] [place],  [place] [address] PCP: Marcine Matar, MD   Assessment & Plan: Visit Diagnoses: No diagnosis found.  Plan: ***  Follow-Up Instructions: No follow-ups on file.   Orders:  No orders of the defined types were placed in this encounter.  No orders of the defined types were placed in this encounter.     Procedures: No procedures performed   Clinical Data: No additional findings.   Subjective: Chief Complaint  Patient presents with   Right Shoulder - Pain, Follow-up    HPI  Review of Systems   Objective: Vital Signs: There were no vitals taken for this visit.  Physical Exam  Ortho Exam  Specialty Comments:  No specialty comments available.  Imaging: No results found.   PMFS History: Patient Active Problem List   Diagnosis Date Noted   Synovitis of right knee 10/24/2022   Obesity (BMI 30.0-34.9) 03/10/2020   Influenza vaccination declined 11/11/2019   Polyarthritis 11/11/2019   HLD (hyperlipidemia)    Chronic cough 07/23/2019   Tobacco dependence 07/23/2019   Chest pain in adult 07/23/2019   OSA (obstructive sleep apnea) 05/15/2016   Neck pain 03/05/2016   Low back pain 03/05/2016   DDD (degenerative disc disease), lumbosacral 01/31/2016   Acute meniscal tear of left knee 07/05/2014   Left knee pain 06/16/2014   Essential hypertension 06/16/2014   Other headache syndrome 02/17/2014   Chronic pain syndrome 02/17/2014   Medication overuse headache 05/06/2013   Dental caries 04/06/2013   Headache 04/06/2013   Past Medical History:  Diagnosis Date   Arthritis    DDD (degenerative disc disease)    GERD (gastroesophageal reflux disease)    Gout    HLD (hyperlipidemia)    Hypertension     Family History  Problem Relation  Age of Onset   Rheum arthritis Mother    Peripheral Artery Disease Mother    Cancer Father    Diabetes Father     Past Surgical History:  Procedure Laterality Date   ANTERIOR CRUCIATE LIGAMENT REPAIR     NECK SURGERY     Social History   Occupational History   Not on file  Tobacco Use   Smoking status: Every Day    Current packs/day: 0.50    Types: Cigarettes   Smokeless tobacco: Never  Vaping Use   Vaping status: Never Used  Substance and Sexual Activity   Alcohol use: Yes    Alcohol/week: 0.0 standard drinks of alcohol    Comment: occasionally- "3 shots a month"   Drug use: No   Sexual activity: Not Currently    Partners: Female

## 2022-11-20 ENCOUNTER — Other Ambulatory Visit: Payer: Self-pay

## 2022-11-20 DIAGNOSIS — M7541 Impingement syndrome of right shoulder: Secondary | ICD-10-CM | POA: Insufficient documentation

## 2022-11-25 ENCOUNTER — Telehealth: Payer: Self-pay

## 2022-11-25 ENCOUNTER — Other Ambulatory Visit: Payer: Self-pay

## 2022-11-25 DIAGNOSIS — M1731 Unilateral post-traumatic osteoarthritis, right knee: Secondary | ICD-10-CM

## 2022-11-25 MED ORDER — PREDNISONE 20 MG PO TABS
ORAL_TABLET | ORAL | 0 refills | Status: AC
  Filled 2022-11-25: qty 4, 5d supply, fill #0

## 2022-11-25 NOTE — Telephone Encounter (Signed)
Prescription sent to our pharmacy for prednisone.  Referral submitted to pain management.

## 2022-11-25 NOTE — Telephone Encounter (Signed)
DISCARD

## 2022-11-25 NOTE — Telephone Encounter (Signed)
Patient in the office today asking to speak with his provider. Patient was advised that his provider is seeing patients and is unable to speak with him at this time. Patient voiced that he his pain in right knee and shoulder. Advised patient to contact his Orthopedic provider. Patient voiced he his and has been unable to reach. voiced that Dr Laural Benes has given him tylenol #3 in the past. Voices that he his appointment in December with ortho. Voiced if PCP can not given him Tylenol # 3 , he  would like for her to call in some prednisone. Patient also asked if PCP could place a pain referral for him. Advised that I would forward message to  his PCP and call him as soon as she respond to the massage.

## 2022-11-25 NOTE — Addendum Note (Signed)
Addended by: Jonah Blue B on: 11/25/2022 06:27 PM   Modules accepted: Orders

## 2022-11-26 ENCOUNTER — Other Ambulatory Visit: Payer: Self-pay

## 2022-11-26 NOTE — Telephone Encounter (Signed)
Call placed to patient unable to reach message left on VM.    Please advise patient if he calls back that Prescription sent to [facility] community pharmacy for prednisone.  Referral submitted to pain management.

## 2022-12-08 ENCOUNTER — Ambulatory Visit
Admission: RE | Admit: 2022-12-08 | Discharge: 2022-12-08 | Disposition: A | Discharge status: Home / Self Care | Payer: Medicaid Other | Source: Ambulatory Visit | Attending: Orthopaedic Surgery | Admitting: Orthopaedic Surgery

## 2022-12-08 DIAGNOSIS — M25511 Pain in right shoulder: Secondary | ICD-10-CM

## 2022-12-13 ENCOUNTER — Ambulatory Visit: Payer: Medicaid Other | Admitting: Orthopaedic Surgery

## 2022-12-16 ENCOUNTER — Other Ambulatory Visit: Payer: Self-pay

## 2022-12-16 ENCOUNTER — Ambulatory Visit: Payer: Self-pay

## 2022-12-16 DIAGNOSIS — I10 Essential (primary) hypertension: Secondary | ICD-10-CM

## 2022-12-16 MED ORDER — HYDROCHLOROTHIAZIDE 25 MG PO TABS: 25.0000 mg | ORAL_TABLET | Freq: Every day | ORAL | 0 refills | Status: DC

## 2022-12-16 MED ORDER — METOPROLOL SUCCINATE ER 50 MG PO TB24
50.0000 mg | ORAL_TABLET | Freq: Every day | ORAL | 0 refills | Status: DC
  Filled 2022-12-16: qty 90, 90d supply, fill #0

## 2022-12-16 MED ORDER — AMLODIPINE BESYLATE 10 MG PO TABS
10.0000 mg | ORAL_TABLET | Freq: Every day | ORAL | 0 refills | Status: DC
  Filled 2022-12-16 – 2023-01-09 (×2): qty 90, 90d supply, fill #0

## 2022-12-16 NOTE — Addendum Note (Signed)
Addended by: Jonah Blue B on: 12/16/2022 01:30 PM   Modules accepted: Orders

## 2022-12-16 NOTE — Telephone Encounter (Signed)
I have sent refill on metoprolol, hydrochlorothiazide and Norvasc to our pharmacy.

## 2022-12-16 NOTE — Telephone Encounter (Signed)
  Chief Complaint: pt wanting to restart metoprolol and hydrochlorothiazide BP 174/80's 140/80's/headache Symptoms: pt stated he felt better taking it , occasional headaches Pertinent Negatives: Patient denies vision problems Disposition: [] ED /[] Urgent Care (no appt availability in office) / [] Appointment(In office/virtual)/ []  [place] [facility]/ [] Home Care/ [] Refused Recommended Disposition /[] [place] [facility]/ [x]  Follow-up with PCP Additional Notes: advised pt would need appt- pt asked if message to provider could be sent instead.  Reason for Disposition  [1] Systolic BP  >= 130 OR Diastolic >= 80 AND [2] taking BP medications  Answer Assessment - Initial Assessment Questions 1. BLOOD PRESSURE: "What is the blood pressure?" "Did you take at least two measurements 5 minutes apart?"     140/80   174/84 average 110/60 2. ONSET: "When did you take your blood pressure?"     His average this past weekend 3. HOW: "How did you take your blood pressure?" (e.g., automatic home BP monitor, visiting nurse)     Auto BP cuff 4. HISTORY: "Do you have a history of high blood pressure?"     yes 5. MEDICINES: "Are you taking any medicines for blood pressure?" "Have you missed any doses recently?"     Yes - no 6. OTHER SYMPTOMS: "Do you have any symptoms?" (e.g., blurred vision, chest pain, difficulty breathing, headache, weakness)     Pain no BP since 15 Oct//headache weakness all the time  Protocols used: Blood Pressure - High-A-AH

## 2022-12-17 NOTE — Telephone Encounter (Signed)
Left message on voicemail to return call.  Rx sent to pharmacy on yesterday.

## 2022-12-24 ENCOUNTER — Ambulatory Visit: Payer: Medicaid Other | Admitting: Orthopaedic Surgery

## 2022-12-24 ENCOUNTER — Encounter: Payer: Self-pay | Admitting: Orthopaedic Surgery

## 2022-12-24 VITALS — BP 162/100 | HR 85 | Ht 73.0 in | Wt 220.0 lb

## 2022-12-24 DIAGNOSIS — M7541 Impingement syndrome of right shoulder: Secondary | ICD-10-CM

## 2022-12-24 DIAGNOSIS — M25511 Pain in right shoulder: Secondary | ICD-10-CM | POA: Diagnosis not present

## 2022-12-24 NOTE — Progress Notes (Signed)
Office Visit Note   Patient: Dustin [name]           Date of Birth: 03-30-1960           MRN: [id number] Visit Date: 12/24/2022              Requested by: Marcine Matar, MD [address] [place] [address] [place],  [place] [address] PCP: Marcine Matar, MD   Assessment & Plan: Visit Diagnoses:  1. Acute pain of right shoulder   2. Impingement syndrome of right shoulder   3.      Right complete rotator cuff tear with retraction and supraspinatus atrophy.  Plan: Patient has massive supraspinatus tear with supraspinatus and infraspinatus muscle atrophy.  Partial-thickness tearing of the subscap.  Biceps long head tendinosis.  Some perching out of the groove noted.  Discussed with him he may require reverse shoulder arthroplasty versus arthroscopy biceps tendon release and biceps tendon debridement.  He does have some mild posterior glenohumeral cartilage thickening as well as on the humeral head.  Will set him up to see Dr. Dorene Grebe to discuss with him treatments for his retracted atrophied rotator cuff tear.  Follow-Up Instructions: No follow-ups on file.   Orders:  Orders Placed This Encounter  Procedures   Ambulatory referral to Orthopedic Surgery   No orders of the defined types were placed in this encounter.     Procedures: No procedures performed   Clinical Data: No additional findings.   Subjective: Chief Complaint  Patient presents with   Right Shoulder - Pain, Follow-up    MRI review    HPI 62 year old male returns in his principal pain has been right shoulder is in a sling.  He has knee problems and has pain with walking can use a walker or 2 canes due to shoulder problem.  He states he has some back problems as well.  He has not worked in several years is applied for disability and has been turned down.  He states he has someone helping him with reapplication.  Pain with abduction of his shoulder.  He is requesting a note since he cannot walk far enough  to get into do jury duty  Thoracic spine MRI is normal.  Lumbar MRI 2018 showed some disc extrusion neuroforaminal narrowing L5-S1 moderate neuroforaminal narrowing L3-4 multilevel disc desiccation.  MRI left knee 2016 showed some body and posterior meniscal tear.  Recent shoulder MRI listed below.  Review of Systems positive smoker.  All other systems noncontributory to HPI.   Objective: Vital Signs: BP (!) 162/100   Pulse 85   Ht 6\' 1"  (1.854 m)   Wt 220 lb (99.8 kg)   BMI 29.03 kg/m   Physical Exam  Ortho Exam  Specialty Comments:  No specialty comments available.  Imaging: Narrative & Impression  CLINICAL DATA:  Shoulder trauma. Rotator cuff tear suspected. Right shoulder pain.   EXAM: MRI OF THE RIGHT SHOULDER WITHOUT CONTRAST   TECHNIQUE: Multiplanar, multisequence MR imaging of the shoulder was performed. No intravenous contrast was administered.   COMPARISON:  Right shoulder radiographs 11/05/2022   FINDINGS: Rotator cuff: There is a massive tear of nearly the entire AP dimension of the supraspinatus and approximately the anterior 50% of the infraspinatus tendon fibers with up to approximately 3.3 cm tendon retraction medial to the humeral head apex. Minimal far anterior supraspinatus tendon fibers measuring only approximately 3 mm in AP dimension may be at least partially intact. Moderate partial-thickness midsubstance tear  of the superior subscapularis tendon insertion (axial series 106 image 16). The teres minor is intact.   Muscles: Moderate supraspinatus and infraspinatus muscle atrophy with diffuse mild edema.   Biceps long head: Mild-to-moderate proximal long head of the biceps intermediate T2 signal tendinosis. The superior subscapularis tendon tear allows the long of the biceps tendon to be perched on the anterior superior aspect of the lesser tuberosity as the biceps tendon enters the bicipital groove (axial images 15 through 17). There is  moderate partial-thickness tearing of the tendon in this region.   Acromioclavicular Joint: There are mild degenerative changes of the acromioclavicular joint including joint space narrowing, subchondral marrow edema, and peripheral osteophytosis. Type II acromion.   Glenohumeral Joint: Mild posterior glenoid cartilage thinning, greatest within the posterosuperior quadrant. Mild medial humeral head cartilage thinning. Small joint effusion extending into the subacromial/subdeltoid bursa.   Labrum: Mild peripheral degenerative irregularity of the posterosuperior glenoid labrum.   Bones:  No acute fracture.   Other: None.   IMPRESSION: 1. Massive tear of nearly the entire AP dimension of the supraspinatus and approximately the anterior 50% of the infraspinatus tendon fibers with up to approximately 3.3 cm tendon retraction medial to the humeral head apex. Moderate supraspinatus and infraspinatus muscle atrophy. 2. Moderate partial-thickness midsubstance tear of the superior subscapularis tendon insertion. 3. Mild-to-moderate proximal long head of the biceps tendinosis. The superior subscapularis tendon tear allows the long of the biceps tendon to be perched on the anterior superior aspect of the lesser tuberosity as the biceps tendon enters the bicipital groove. There is moderate partial-thickness tearing of the tendon in this region. 4. Mild degenerative changes of the acromioclavicular joint. 5. Mild glenohumeral cartilage degenerative changes.     Electronically Signed   By: Neita Garnet M.D.   On: 12/20/2022 13:18     PMFS History: Patient Active Problem List   Diagnosis Date Noted   Impingement syndrome of right shoulder 11/20/2022   Synovitis of right knee 10/24/2022   Obesity (BMI 30.0-34.9) 03/10/2020   Influenza vaccination declined 11/11/2019   Polyarthritis 11/11/2019   HLD (hyperlipidemia)    Chronic cough 07/23/2019   Tobacco dependence 07/23/2019    Chest pain in adult 07/23/2019   OSA (obstructive sleep apnea) 05/15/2016   Neck pain 03/05/2016   Low back pain 03/05/2016   DDD (degenerative disc disease), lumbosacral 01/31/2016   Acute meniscal tear of left knee 07/05/2014   Left knee pain 06/16/2014   Essential hypertension 06/16/2014   Other headache syndrome 02/17/2014   Chronic pain syndrome 02/17/2014   Medication overuse headache 05/06/2013   Dental caries 04/06/2013   Headache 04/06/2013   Past Medical History:  Diagnosis Date   Arthritis    DDD (degenerative disc disease)    GERD (gastroesophageal reflux disease)    Gout    HLD (hyperlipidemia)    Hypertension     Family History  Problem Relation Age of Onset   Rheum arthritis Mother    Peripheral Artery Disease Mother    Cancer Father    Diabetes Father     Past Surgical History:  Procedure Laterality Date   ANTERIOR CRUCIATE LIGAMENT REPAIR     NECK SURGERY     Social History   Occupational History   Not on file  Tobacco Use   Smoking status: Every Day    Current packs/day: 0.50    Types: Cigarettes   Smokeless tobacco: Never  Vaping Use   Vaping status: Never Used  Substance and  Sexual Activity   Alcohol use: Yes    Alcohol/week: 0.0 standard drinks of alcohol    Comment: occasionally- "3 shots a month"   Drug use: No   Sexual activity: Not Currently    Partners: Female

## 2022-12-25 ENCOUNTER — Other Ambulatory Visit: Payer: Self-pay

## 2022-12-25 MED ORDER — AMITRIPTYLINE HCL 50 MG PO TABS
50.0000 mg | ORAL_TABLET | Freq: Every day | ORAL | 2 refills | Status: AC
  Filled 2022-12-25 – 2023-01-09 (×2): qty 30, 30d supply, fill #0
  Filled 2023-04-10: qty 30, 30d supply, fill #1
  Filled 2023-07-09: qty 30, 30d supply, fill #2

## 2022-12-25 NOTE — Telephone Encounter (Signed)
Patient aware the medication was sent to pharmacy.  He requested refill for an additional one: Amitriptyline. Rx was sent to pharmacy.

## 2022-12-25 NOTE — Addendum Note (Signed)
Addended by: Guy Franco on: 12/25/2022 02:43 PM   Modules accepted: Orders

## 2023-01-06 ENCOUNTER — Other Ambulatory Visit: Payer: Self-pay

## 2023-01-09 ENCOUNTER — Other Ambulatory Visit: Payer: Self-pay | Admitting: Internal Medicine

## 2023-01-09 ENCOUNTER — Other Ambulatory Visit: Payer: Self-pay

## 2023-01-09 DIAGNOSIS — I10 Essential (primary) hypertension: Secondary | ICD-10-CM

## 2023-01-10 ENCOUNTER — Other Ambulatory Visit: Payer: Self-pay

## 2023-01-10 MED ORDER — HYDROCHLOROTHIAZIDE 25 MG PO TABS
25.0000 mg | ORAL_TABLET | Freq: Every day | ORAL | 0 refills | Status: DC
  Filled 2023-01-10: qty 30, 30d supply, fill #0

## 2023-01-15 ENCOUNTER — Telehealth: Payer: Self-pay

## 2023-01-15 ENCOUNTER — Ambulatory Visit: Payer: Medicaid Other | Admitting: Orthopedic Surgery

## 2023-01-15 ENCOUNTER — Encounter: Payer: Self-pay | Admitting: Orthopedic Surgery

## 2023-01-15 DIAGNOSIS — M12811 Other specific arthropathies, not elsewhere classified, right shoulder: Secondary | ICD-10-CM

## 2023-01-15 NOTE — Telephone Encounter (Signed)
 Sending as reminder to follow up with patient about potential dental work prior to [place]

## 2023-01-16 ENCOUNTER — Telehealth: Payer: Self-pay | Admitting: Orthopedic Surgery

## 2023-01-16 ENCOUNTER — Encounter: Payer: Self-pay | Admitting: Orthopedic Surgery

## 2023-01-16 NOTE — Telephone Encounter (Signed)
 Patient called. Says the order for the MRI is supposed to be with Contrast.

## 2023-01-16 NOTE — Progress Notes (Signed)
 Office Visit Note   Patient: Dustin [name]           Date of Birth: 05-Apr-1960           MRN: [id number] Visit Date: 01/15/2023 Requested by: Barbarann Oneil BROCKS, MD [address] [place],  [place] [address] PCP: Vicci Barnie NOVAK, MD  Subjective: Chief Complaint  Patient presents with   Right Shoulder - Pain    HPI: Dustin [name] is a 63 y.o. male who presents to the office reporting right shoulder pain of 67-month duration.  The pain wakes him from sleep at night.  No prior right shoulder surgery.  He reports both pain and weakness in the right shoulder.  He actually wears a sling because of the weakness.  He is to be a programmer, multimedia.  He is in the process of trying to gain disability.  Currently not working and not doing anything to strenuous for work but he does have a lot of projects around the house that he does which can be involved.  Has a mother and dogs that he takes care of.  He does smoke 1/2 pack/day.  He also states that he needs a laminectomy for some back issues that he is having.  Of note the he was recommended to have his teeth pulled by an oral surgeon some years ago.  That did not happen..                ROS: All systems reviewed are negative as they relate to the chief complaint within the history of present illness.  Patient denies fevers or chills.  Assessment & Plan: Visit Diagnoses:  1. Rotator cuff arthropathy of right shoulder     Plan: Impression is right shoulder rotator cuff arthropathy with irreparable supraspinatus and infraspinatus tendon tears.  There is also mid substance partial-thickness tear of the superior subscapularis tendon with some early glenohumeral joint degenerative changes.  Biceps tendinopathy is also present.  Talked a lot to [place] about operative and nonoperative treatment options.  Basically he needs something done about the shoulder because of the pain and weakness.  The rotator cuff tear is not repairable.  Biceps  tenodesis would likely help about 5 to 10% of his pain symptoms.  Patch augmentation or partial repair would be unpredictable for pain relief and restoration of strength.  Even though he is young his best bet for pain relief and return of some modicum of function to the shoulder would be reverse shoulder replacement.  The risk and benefits are discussed with the patient including not limited to infection nerve vessel damage incomplete pain relief as well as incomplete restoration of function as well as the possibility of dislocation.  He would need to adhere to postop lifting limits following the surgery.  All this is discussed with the patient and he would like to proceed.  I did recommend that he see an oral surgeon/dentist prior to total joint replacement in order to minimize chances for perioperative and postoperative infection related to dental hygiene.  I discussed that with him on the phone he will do that.  Think that CT scan for preop patient specific instrumentation has been ordered.  All questions answered.  Follow-Up Instructions: No follow-ups on file.   Orders:  Orders Placed This Encounter  Procedures   CT SHOULDER RIGHT WO CONTRAST   No orders of the defined types were placed in this encounter.     Procedures: No procedures performed   Clinical Data:  No additional findings.  Objective: Vital Signs: There were no vitals taken for this visit.  Physical Exam:  Constitutional: Patient appears well-developed HEENT:  Head: Normocephalic Eyes:EOM are normal Neck: Normal range of motion Cardiovascular: Normal rate Pulmonary/chest: Effort normal Neurologic: Patient is alert Skin: Skin is warm Psychiatric: Patient has normal mood and affect  Ortho Exam: Ortho exam demonstrates right shoulder forward flexion and 90 and abduction to about 85.  Passive range of motion bilaterally is 60/100/160.  Does have a lot of pain with any attempts at abduction and forward flexion above 70  degrees.  Coarseness and popping present with passive range of motion of the right shoulder but no discrete AC joint tenderness is present.  Motor or sensory function to the hand is intact.  Specialty Comments:  No specialty comments available.  Imaging: No results found.   PMFS History: Patient Active Problem List   Diagnosis Date Noted   Impingement syndrome of right shoulder 11/20/2022   Synovitis of right knee 10/24/2022   Obesity (BMI 30.0-34.9) 03/10/2020   Influenza vaccination declined 11/11/2019   Polyarthritis 11/11/2019   HLD (hyperlipidemia)    Chronic cough 07/23/2019   Tobacco dependence 07/23/2019   Chest pain in adult 07/23/2019   OSA (obstructive sleep apnea) 05/15/2016   Neck pain 03/05/2016   Low back pain 03/05/2016   DDD (degenerative disc disease), lumbosacral 01/31/2016   Acute meniscal tear of left knee 07/05/2014   Left knee pain 06/16/2014   Essential hypertension 06/16/2014   Other headache syndrome 02/17/2014   Chronic pain syndrome 02/17/2014   Medication overuse headache 05/06/2013   Dental caries 04/06/2013   Headache 04/06/2013   Past Medical History:  Diagnosis Date   Arthritis    DDD (degenerative disc disease)    GERD (gastroesophageal reflux disease)    Gout    HLD (hyperlipidemia)    Hypertension     Family History  Problem Relation Age of Onset   Rheum arthritis Mother    Peripheral Artery Disease Mother    Cancer Father    Diabetes Father     Past Surgical History:  Procedure Laterality Date   ANTERIOR CRUCIATE LIGAMENT REPAIR     NECK SURGERY     Social History   Occupational History   Not on file  Tobacco Use   Smoking status: Every Day    Current packs/day: 0.50    Types: Cigarettes   Smokeless tobacco: Never  Vaping Use   Vaping status: Never Used  Substance and Sexual Activity   Alcohol use: Yes    Alcohol/week: 0.0 standard drinks of alcohol    Comment: occasionally- 3 shots a month   Drug use: No    Sexual activity: Not Currently    Partners: Female

## 2023-01-16 NOTE — Telephone Encounter (Signed)
 I called he will see dentist

## 2023-01-16 NOTE — Telephone Encounter (Signed)
 Done thx

## 2023-01-17 ENCOUNTER — Other Ambulatory Visit: Payer: Self-pay

## 2023-01-18 ENCOUNTER — Encounter: Payer: Self-pay | Admitting: Orthopedic Surgery

## 2023-01-24 ENCOUNTER — Ambulatory Visit: Payer: Medicaid Other | Admitting: Internal Medicine

## 2023-02-03 ENCOUNTER — Ambulatory Visit
Admission: RE | Admit: 2023-02-03 | Discharge: 2023-02-03 | Disposition: A | Discharge status: Home / Self Care | Payer: Medicaid Other | Source: Ambulatory Visit | Attending: Orthopedic Surgery | Admitting: Orthopedic Surgery

## 2023-02-03 DIAGNOSIS — M12811 Other specific arthropathies, not elsewhere classified, right shoulder: Secondary | ICD-10-CM

## 2023-02-06 ENCOUNTER — Encounter: Payer: Self-pay | Admitting: *Deleted

## 2023-02-06 ENCOUNTER — Ambulatory Visit
Admission: EM | Admit: 2023-02-06 | Discharge: 2023-02-06 | Disposition: A | Discharge status: Home / Self Care | Payer: Medicaid Other | Attending: Family Medicine | Admitting: Family Medicine

## 2023-02-06 ENCOUNTER — Other Ambulatory Visit: Payer: Self-pay

## 2023-02-06 DIAGNOSIS — K0889 Other specified disorders of teeth and supporting structures: Secondary | ICD-10-CM

## 2023-02-06 MED ORDER — OLOPATADINE HCL 0.2 % OP SOLN: 1.0000 [drp] | Freq: Every day | OPHTHALMIC | 0 refills | Status: AC

## 2023-02-06 MED ORDER — AMOXICILLIN-POT CLAVULANATE 875-125 MG PO TABS: 1.0000 | ORAL_TABLET | Freq: Two times a day (BID) | ORAL | 0 refills | Status: DC

## 2023-02-06 NOTE — ED Provider Notes (Signed)
  [facility]   [id number] 02/06/23 Arrival Time: 1430  ASSESSMENT & PLAN:  1. Pain, dental    No sign of abscess requiring I&D at this time. Discussed.  Meds ordered this encounter  Medications   amoxicillin-clavulanate (AUGMENTIN) 875-125 MG tablet    Sig: Take 1 tablet by mouth every 12 (twelve) hours.    Dispense:  20 tablet    Refill:  0   Olopatadine HCl 0.2 % SOLN    Sig: Apply 1 drop to eye daily.    Dispense:  2.5 mL    Refill:  0   Has f/u with optometrist and dental in the near future.  Reviewed expectations re: course of current medical issues. Questions answered. Outlined signs and symptoms indicating need for more acute intervention. Patient verbalized understanding. After Visit Summary given.   SUBJECTIVE:  Dustin [name] is a 63 y.o. male who reports R lower dental pain; gradual onset; few weeks; needs molar pulled. Denies fever. Also reports several weeks of itchy eyes and eye irritation. Denies eye pain. Does not wear contacts. No tx PTA.  OBJECTIVE: Vitals:   02/06/23 1459  BP: (!) 155/94  Pulse: 99  Resp: 18  Temp: 98.5 F (36.9 C)  TempSrc: Oral  SpO2: 95%    General appearance: alert; no distress HENT: normocephalic; atraumatic; dentition: poor; right lower rear gum without areas of fluctuance, drainage, or bleeding and with tenderness to palpation; normal jaw movement without difficulty; bilateral irritated/watery eyes Neck: supple without LAD; FROM; trachea midline Lungs: normal respirations; unlabored; speaks full sentences without difficulty Skin: warm and dry Psychological: alert and cooperative; normal mood and affect  No Known Allergies  Past Medical History:  Diagnosis Date   Arthritis    DDD (degenerative disc disease)    GERD (gastroesophageal reflux disease)    Gout    HLD (hyperlipidemia)    Hypertension    Social History   Socioeconomic History   Marital status: Legally Separated    Spouse name: Not on  file   Number of children: Not on file   Years of education: Not on file   Highest education level: Not on file  Occupational History   Not on file  Tobacco Use   Smoking status: Every Day    Current packs/day: 0.50    Types: Cigarettes   Smokeless tobacco: Never  Vaping Use   Vaping status: Never Used  Substance and Sexual Activity   Alcohol use: Yes    Alcohol/week: 0.0 standard drinks of alcohol    Comment: occasionally- "3 shots a month"   Drug use: No   Sexual activity: Not Currently    Partners: Female  Other Topics Concern   Not on file  Social History Narrative   Not on file   Social Drivers of Health   Financial Resource Strain: Not on file  Food Insecurity: Not on file  Transportation Needs: Not on file  Physical Activity: Not on file  Stress: Not on file  Social Connections: Not on file  Intimate Partner Violence: Not on file   Family History  Problem Relation Age of Onset   Rheum arthritis Mother    Peripheral Artery Disease Mother    Cancer Father    Diabetes Father    Past Surgical History:  Procedure Laterality Date   ANTERIOR CRUCIATE LIGAMENT REPAIR     NECK SURGERY        [name], MD 02/06/23 1700

## 2023-02-06 NOTE — ED Triage Notes (Signed)
Pt reports pain in right lower jaw and swelling. States tooth is loose and has had a prior root canal. He is working on [employer] an appt with a Education officer, community. He requesting antibiotics so he can get the infection controlled before he sees the dentist.

## 2023-02-12 ENCOUNTER — Encounter: Payer: Self-pay | Admitting: Orthopedic Surgery

## 2023-02-12 ENCOUNTER — Ambulatory Visit: Payer: Medicaid Other | Admitting: Orthopedic Surgery

## 2023-02-12 DIAGNOSIS — M12811 Other specific arthropathies, not elsewhere classified, right shoulder: Secondary | ICD-10-CM

## 2023-02-12 NOTE — Progress Notes (Signed)
If he does not call tomorrow can you call him Friday and have him tell us what the dentist said because realistically he needs some if not all of his teeth pulled prior to that joint replacement surgery on his shoulder

## 2023-02-13 ENCOUNTER — Telehealth: Payer: Self-pay

## 2023-02-13 ENCOUNTER — Encounter: Payer: Self-pay | Admitting: Orthopedic Surgery

## 2023-02-13 NOTE — Telephone Encounter (Signed)
-----   Message from KANDICE Glendia Hutchinson sent at 02/12/2023  5:36 PM EST ----- If he does not call tomorrow can you call him Friday and have him tell us  what the dentist said because realistically he needs some if not all of his teeth pulled prior to that joint replacement surgery on his shoulder

## 2023-02-13 NOTE — Progress Notes (Signed)
 Office Visit Note   Patient: Dustin [name]           Date of Birth: [birth year]           MRN: [id number] Visit Date: 02/12/2023 Requested by: Vicci Barnie NOVAK, MD [address] [place],  [place] [address] PCP: Vicci Barnie NOVAK, MD  Subjective: Chief Complaint  Patient presents with   Right Shoulder - Follow-up    CT review    HPI: Dustin [name] is a 63 y.o. male who presents to the office reporting right shoulder pain.  Since he was last seen has had a CT scan for potential preop planning purposes.  That CT scan shows complete tear with retraction and atrophy of the supraspinatus and infraspinatus.  There is some arthritis in that glenohumeral joint.  This is consistent with his exam.  He is currently taking oral antibiotics for tooth infection.  He is seeing the dentist 1 day after this clinic appointment.  He has been told by dentist and oral surgeons in the past that he needs to have his teeth pulled.  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                ROS: All systems reviewed are negative as they relate to the chief complaint within the history of present illness.  Patient denies fevers or chills.  Assessment & Plan: Visit Diagnoses:  1. Rotator cuff arthropathy of right shoulder     Plan: Impression is right shoulder rotator cuff arthropathy.  He does have diminished shoulder function.  CT scan shows adequate glenoid vault for reverse shoulder replacement.  He needs to get these teeth addressed prior to any elective joint replacement.  He would need to have dental risk stratification prior to surgery.  He will follow-up with us  as needed.  Follow-Up Instructions: No follow-ups on file.   Orders:  No orders of the defined types were placed in this encounter.  No orders of the defined types were placed in this encounter.     Procedures: No procedures performed   Clinical Data: No additional findings.  Objective: Vital  Signs: There were no vitals taken for this visit.  Physical Exam:  Constitutional: Patient appears well-developed HEENT:  Head: Normocephalic Eyes:EOM are normal Neck: Normal range of motion Cardiovascular: Normal rate Pulmonary/chest: Effort normal Neurologic: Patient is alert Skin: Skin is warm Psychiatric: Patient has normal mood and affect  Ortho Exam: Ortho exam demonstrates diminished forward flexion and abduction on the right side.  Does have external rotation weakness on the right compared to the left.  Passive range of motion reasonably well-maintained.  Deltoid is functional.  No masses lymphadenopathy or skin changes noted in that  Specialty Comments:  No specialty comments available.  Imaging: No results found.   PMFS History: Patient Active Problem List   Diagnosis Date Noted   Impingement syndrome of right shoulder 11/20/2022   Synovitis of right knee 10/24/2022   Obesity (BMI 30.0-34.9) 03/10/2020   Influenza vaccination declined 11/11/2019   Polyarthritis 11/11/2019   HLD (hyperlipidemia)    Chronic cough 07/23/2019   Tobacco dependence 07/23/2019   Chest pain in adult 07/23/2019   OSA (obstructive sleep apnea) 05/15/2016   Neck pain 03/05/2016   Low back pain 03/05/2016   DDD (degenerative disc disease), lumbosacral 01/31/2016   Acute meniscal tear of left knee 07/05/2014   Left knee pain 06/16/2014   Essential hypertension 06/16/2014  Other headache syndrome 02/17/2014   Chronic pain syndrome 02/17/2014   Medication overuse headache 05/06/2013   Dental caries 04/06/2013   Headache 04/06/2013   Past Medical History:  Diagnosis Date   Arthritis    DDD (degenerative disc disease)    GERD (gastroesophageal reflux disease)    Gout    HLD (hyperlipidemia)    Hypertension     Family History  Problem Relation Age of Onset   Rheum arthritis Mother    Peripheral Artery Disease Mother    Cancer Father    Diabetes Father     Past Surgical  History:  Procedure Laterality Date   ANTERIOR CRUCIATE LIGAMENT REPAIR     NECK SURGERY     Social History   Occupational History   Not on file  Tobacco Use   Smoking status: Every Day    Current packs/day: 0.50    Types: Cigarettes   Smokeless tobacco: Never  Vaping Use   Vaping status: Never Used  Substance and Sexual Activity   Alcohol use: Yes    Alcohol/week: 0.0 standard drinks of alcohol    Comment: occasionally- 3 shots a month   Drug use: No   Sexual activity: Not Currently    Partners: Female

## 2023-02-13 NOTE — Telephone Encounter (Signed)
 Holding as reminder for when patient provides additional information regarding dentist.

## 2023-02-17 NOTE — Telephone Encounter (Signed)
 He cannot be posted until he has dental clearance thanks

## 2023-02-20 ENCOUNTER — Encounter: Payer: Self-pay | Admitting: Internal Medicine

## 2023-02-20 ENCOUNTER — Ambulatory Visit: Payer: Medicaid Other | Attending: Internal Medicine | Admitting: Internal Medicine

## 2023-02-20 ENCOUNTER — Other Ambulatory Visit: Payer: Self-pay

## 2023-02-20 VITALS — BP 142/85 | HR 76 | Ht 73.0 in | Wt 239.0 lb

## 2023-02-20 DIAGNOSIS — F17210 Nicotine dependence, cigarettes, uncomplicated: Secondary | ICD-10-CM | POA: Diagnosis not present

## 2023-02-20 DIAGNOSIS — Z1211 Encounter for screening for malignant neoplasm of colon: Secondary | ICD-10-CM

## 2023-02-20 DIAGNOSIS — I10 Essential (primary) hypertension: Secondary | ICD-10-CM

## 2023-02-20 DIAGNOSIS — Z01818 Encounter for other preprocedural examination: Secondary | ICD-10-CM

## 2023-02-20 DIAGNOSIS — K029 Dental caries, unspecified: Secondary | ICD-10-CM | POA: Diagnosis not present

## 2023-02-20 DIAGNOSIS — E0849 Diabetes mellitus due to underlying condition with other diabetic neurological complication: Secondary | ICD-10-CM | POA: Insufficient documentation

## 2023-02-20 DIAGNOSIS — M170 Bilateral primary osteoarthritis of knee: Secondary | ICD-10-CM

## 2023-02-20 DIAGNOSIS — F17200 Nicotine dependence, unspecified, uncomplicated: Secondary | ICD-10-CM

## 2023-02-20 DIAGNOSIS — Z2821 Immunization not carried out because of patient refusal: Secondary | ICD-10-CM

## 2023-02-20 MED ORDER — AMLODIPINE BESYLATE 10 MG PO TABS
10.0000 mg | ORAL_TABLET | Freq: Every day | ORAL | 1 refills | Status: AC
  Filled 2023-02-20 – 2023-05-07 (×2): qty 90, 90d supply, fill #0
  Filled 2023-07-09 – 2023-07-16 (×2): qty 90, 90d supply, fill #1

## 2023-02-20 MED ORDER — HYDROCHLOROTHIAZIDE 25 MG PO TABS
25.0000 mg | ORAL_TABLET | Freq: Every day | ORAL | 1 refills | Status: AC
  Filled 2023-02-20: qty 90, 90d supply, fill #0
  Filled 2023-05-15 – 2023-07-09 (×2): qty 90, 90d supply, fill #1

## 2023-02-20 MED ORDER — METOPROLOL SUCCINATE ER 100 MG PO TB24
100.0000 mg | ORAL_TABLET | Freq: Every day | ORAL | 1 refills | Status: AC
  Filled 2023-02-20: qty 90, 90d supply, fill #0
  Filled 2023-05-15 – 2023-07-09 (×2): qty 90, 90d supply, fill #1

## 2023-02-20 NOTE — Progress Notes (Signed)
Patient ID: Dustin [name], male    DOB: 10-26-1960  MRN: [id number]  CC: Pre-op Exam (Pre-op exam. /No questions / concerns/No to all vax)   Subjective: Dustin [name] is a 63 y.o. male who presents for chronic ds management. His concerns today include:  Pt with hx of HTN, HL, OA, obesity, DD LS spine, false positive HIV testing 2018, tob dep.    Discussed the use of AI scribe software for clinical note transcription with the patient, who gave verbal consent to proceed.  History of Present Illness   Dustin [name], with a history of hypertension, hyperlipidemia, and osteoarthritis, presents for a preoperative evaluation for dental surgery. The patient is scheduled for multiple extractions and bone grafts due to severe dental disease, including a lack of gum tissue on one side and multiple broken/decayed teeth at the gum line. This dental work is a prerequisite for the patient's planned orthopedic surgeries, including a total shoulder replacement and a total knee replacement.  The patient reports no chest pain or shortness of breath, and is able to walk at least two blocks without these symptoms, despite knee pain. He has been monitoring his blood pressure at home, which has been in the range of 140/80.  Metoprolol and HCTZ were stopped on last visit because blood pressure was a bit on the low side.  He was left on amlodipine 10 mg daily.  He tells me that he restarted the metoprolol and HCTZ due to his blood pressure running higher.  Reports compliance with taking the rosuvastatin for his cholesterol.  The patient is a current smoker and plans to quit completely before his upcoming surgeries; has the 21 mg nicotine patches at home which he plans to start using again.  Since last visit with me, he got plugged in with [facility] medical pain management clinic and is currently on oxycodone for arthritis pain  HM: The patient also mentions a need for colon cancer screening and expresses a  preference for a colonoscopy due to the presence of polyps in his lower colorectal area.  He declines pneumonia and shingles vaccines.    Patient Active Problem List   Diagnosis Date Noted   Impingement syndrome of right shoulder 11/20/2022   Synovitis of right knee 10/24/2022   Obesity (BMI 30.0-34.9) 03/10/2020   Influenza vaccination declined 11/11/2019   Polyarthritis 11/11/2019   HLD (hyperlipidemia)    Chronic cough 07/23/2019   Tobacco dependence 07/23/2019   Chest pain in adult 07/23/2019   OSA (obstructive sleep apnea) 05/15/2016   Neck pain 03/05/2016   Low back pain 03/05/2016   DDD (degenerative disc disease), lumbosacral 01/31/2016   Acute meniscal tear of left knee 07/05/2014   Left knee pain 06/16/2014   Essential hypertension 06/16/2014   Other headache syndrome 02/17/2014   Chronic pain syndrome 02/17/2014   Medication overuse headache 05/06/2013   Dental caries 04/06/2013   Headache 04/06/2013     Current Outpatient Medications on File Prior to Visit  Medication Sig Dispense Refill   amitriptyline (ELAVIL) 50 MG tablet Take 1 tablet (50 mg total) by mouth at bedtime. 30 tablet 2   aspirin EC 81 MG tablet Take 1 tablet (81 mg total) by mouth daily. Swallow whole. 100 tablet 1   cyclobenzaprine (FLEXERIL) 10 MG tablet Take 10 mg by mouth 3 (three) times daily as needed.     gabapentin (NEURONTIN) 300 MG capsule Take 1 capsule (300 mg total) by mouth 2 (two) times daily. 180 capsule 2  meloxicam (MOBIC) 15 MG tablet Take 1 tablet (15 mg total) by mouth daily. 90 tablet 1   Olopatadine HCl 0.2 % SOLN Apply 1 drop to eye daily. 2.5 mL 0   oxyCODONE-acetaminophen (PERCOCET) 10-325 MG tablet Take 0.5-1 tablets by mouth 4 (four) times daily as needed.     rosuvastatin (CRESTOR) 20 MG tablet Take 1 tablet (20 mg total) by mouth daily.Please keep upcoming appt for more refills. 90 tablet 1   Vitamin D, Ergocalciferol, (DRISDOL) 1.25 MG (50000 UNIT) CAPS capsule Take  50,000 Units by mouth once a week.     nicotine (NICODERM CQ - DOSED IN MG/24 HOURS) 21 mg/24hr patch Place 1 patch (21 mg total) onto the skin daily. (Patient not taking: Reported on 02/20/2023) 28 patch 2   No current facility-administered medications on file prior to visit.    No Known Allergies  Social History   Socioeconomic History   Marital status: Legally Separated    Spouse name: Not on file   Number of children: Not on file   Years of education: Not on file   Highest education level: Not on file  Occupational History   Not on file  Tobacco Use   Smoking status: Every Day    Current packs/day: 0.50    Types: Cigarettes   Smokeless tobacco: Never  Vaping Use   Vaping status: Never Used  Substance and Sexual Activity   Alcohol use: Yes    Alcohol/week: 0.0 standard drinks of alcohol    Comment: occasionally- "3 shots a month"   Drug use: No   Sexual activity: Not Currently    Partners: Female  Other Topics Concern   Not on file  Social History Narrative   Not on file   Social Drivers of Health   Financial Resource Strain: Medium Risk (02/20/2023)   Overall Financial Resource Strain (CARDIA)    Difficulty of Paying Living Expenses: Somewhat hard  Food Insecurity: Food Insecurity Present (02/20/2023)   Hunger Vital Sign    Worried About Running Out of Food in the Last Year: Never true    Ran Out of Food in the Last Year: Sometimes true  Transportation Needs: No Transportation Needs (02/20/2023)   PRAPARE - Administrator, Civil Service (Medical): No    Lack of Transportation (Non-Medical): No  Physical Activity: Insufficiently Active (02/20/2023)   Exercise Vital Sign    Days of Exercise per Week: 5 days    Minutes of Exercise per Session: 20 min  Stress: No Stress Concern Present (02/20/2023)   [employer] of Occupational Health - Occupational Stress Questionnaire    Feeling of Stress : Only a little  Social Connections: Moderately Integrated  (02/20/2023)   Social Connection and Isolation Panel [NHANES]    Frequency of Communication with Friends and Family: Twice a week    Frequency of Social Gatherings with Friends and Family: Twice a week    Attends Religious Services: 1 to 4 times per year    Active Member of [employer] or Organizations: Yes    Attends Banker Meetings: Never    Marital Status: Separated  Intimate Partner Violence: Not At Risk (02/20/2023)   Humiliation, Afraid, Rape, and Kick questionnaire    Fear of Current or Ex-Partner: No    Emotionally Abused: No    Physically Abused: No    Sexually Abused: No    Family History  Problem Relation Age of Onset   Rheum arthritis Mother    Peripheral Artery  Disease Mother    Cancer Father    Diabetes Father     Past Surgical History:  Procedure Laterality Date   ANTERIOR CRUCIATE LIGAMENT REPAIR     NECK SURGERY      ROS: Review of Systems Negative except as stated above  PHYSICAL EXAM: BP (!) 142/85   Pulse 76   Ht 6\' 1"  (1.854 m)   Wt 239 lb (108.4 kg)   SpO2 97%   BMI 31.53 kg/m   Wt Readings from Last 3 Encounters:  02/20/23 239 lb (108.4 kg)  12/24/22 220 lb (99.8 kg)  10/22/22 233 lb 6.4 oz (105.9 kg)    Physical Exam   General appearance - alert, well appearing, older Caucasian male and in no distress Mental status - normal mood, behavior, speech, dress, motor activity, and thought processes Eyes - pupils equal and reactive, extraocular eye movements intact Nose - normal and patent, no erythema, discharge or polyps Mouth - mucous membranes moist, pharynx normal without lesions.  He has multiple decayed broken teeth especially in the upper jaw. Neck - supple, no significant adenopathy Lymphatics - no palpable lymphadenopathy, no hepatosplenomegaly Chest - clear to auscultation, no wheezes, rales or rhonchi, symmetric air entry Heart - normal rate, regular rhythm, normal S1, S2, no murmurs, rubs, clicks or gallops Abdomen - soft,  nontender, nondistended, no masses or organomegaly Extremities - peripheral pulses normal, no pedal edema, no clubbing or cyanosis     Latest Ref Rng & Units 11/07/2022    9:25 AM 10/22/2022    5:01 PM 02/05/2022    5:02 PM  CMP  Glucose 70 - 99 mg/dL  89  284   BUN 8 - 27 mg/dL  12  8   Creatinine 1.32 - 1.27 mg/dL  4.40  1.02   Sodium 725 - 144 mmol/L  144  139   Potassium 3.5 - 5.2 mmol/L 5.1  5.9  5.0   Chloride 96 - 106 mmol/L  102  100   CO2 20 - 29 mmol/L  24  21   Calcium 8.6 - 10.2 mg/dL  36.6  9.6   Total Protein 6.0 - 8.5 g/dL  7.4  7.4   Total Bilirubin 0.0 - 1.2 mg/dL  0.2  0.2   Alkaline Phos 44 - 121 IU/L  82  68   AST 0 - 40 IU/L  20  18   ALT 0 - 44 IU/L  22  18    Lipid Panel     Component Value Date/Time   CHOL 142 02/05/2022 1702   TRIG 111 02/05/2022 1702   HDL 53 02/05/2022 1702   CHOLHDL 2.7 02/05/2022 1702   CHOLHDL 4.0 01/31/2016 1047   VLDL 33 (H) 01/31/2016 1047   LDLCALC 69 02/05/2022 1702    CBC    Component Value Date/Time   WBC 7.2 10/22/2022 1701   WBC 10.7 01/31/2016 1047   RBC 5.48 10/22/2022 1701   RBC 5.14 01/31/2016 1047   HGB 16.8 10/22/2022 1701   HCT 50.9 10/22/2022 1701   PLT 288 10/22/2022 1701   MCV 93 10/22/2022 1701   MCH 30.7 10/22/2022 1701   MCH 31.3 01/31/2016 1047   MCHC 33.0 10/22/2022 1701   MCHC 33.6 01/31/2016 1047   RDW 12.1 10/22/2022 1701   LYMPHSABS 2.6 08/02/2021 1059   MONOABS 642 01/31/2016 1047   EOSABS 0.2 08/02/2021 1059   BASOSABS 0.1 08/02/2021 1059    ASSESSMENT AND PLAN: 1. Preoperative evaluation to rule  out surgical contraindication (Primary) Over all low risk procedure.  Will increase Toprol to get pressure better and recheck chemistry to make sure his potassium level has remained normal and then we will release him for surgery.  2. Dental caries Plan for multiple extractions by his dentist at [facility]  3. Essential hypertension Not at goal.  Increase metoprolol to 100 mg daily.   Continue amlodipine 10 mg daily and HCTZ 25 mg daily. - hydrochlorothiazide (HYDRODIURIL) 25 MG tablet; Take 1 tablet (25 mg total) by mouth daily  Dispense: 90 tablet; Refill: 1 - amLODipine (NORVASC) 10 MG tablet; Take 1 tablet (10 mg total) by mouth daily.  Dispense: 90 tablet; Refill: 1 - metoprolol succinate (TOPROL-XL) 100 MG 24 hr tablet; Take 1 tablet (100 mg total) by mouth daily. Take with or immediately following a meal.  Dispense: 90 tablet; Refill: 1 - Basic Metabolic Panel  4. Tobacco dependence Strongly advised to quit.  This would be preferable and ideal prior to joint replacement surgeries.  Patient states he is working on trying to quit.  He plans to restart the nicotine patches  5. Arthritis of both knees Plugged in with pain management and followed by Ortho.  6. Screening for colon cancer - Ambulatory referral to Gastroenterology  7. Pneumococcal vaccination declined Recommended.  Patient declined  8. Herpes zoster vaccination declined Recommended.  Patient declined  Patient was given the opportunity to ask questions.  Patient verbalized understanding of the plan and was able to repeat key elements of the plan.   This documentation was completed using Paediatric nurse.  Any transcriptional errors are unintentional.  Orders Placed This Encounter  Procedures   Basic Metabolic Panel   Ambulatory referral to Gastroenterology     Requested Prescriptions   Signed Prescriptions Disp Refills   hydrochlorothiazide (HYDRODIURIL) 25 MG tablet 90 tablet 1    Sig: Take 1 tablet (25 mg total) by mouth daily   amLODipine (NORVASC) 10 MG tablet 90 tablet 1    Sig: Take 1 tablet (10 mg total) by mouth daily.   metoprolol succinate (TOPROL-XL) 100 MG 24 hr tablet 90 tablet 1    Sig: Take 1 tablet (100 mg total) by mouth daily. Take with or immediately following a meal.    Return in about 4 months (around 06/20/2023).  Jonah Blue, MD, FACP

## 2023-02-20 NOTE — Patient Instructions (Signed)
Increase metoprolol to 100 mg daily.  An updated prescription was sent to your pharmacy.

## 2023-02-21 ENCOUNTER — Other Ambulatory Visit: Payer: Self-pay

## 2023-02-21 LAB — BASIC METABOLIC PANEL
BUN/Creatinine Ratio: 16 (ref 10–24)
BUN: 19 mg/dL (ref 8–27)
CO2: 24 mmol/L (ref 20–29)
Calcium: 9.4 mg/dL (ref 8.6–10.2)
Chloride: 100 mmol/L (ref 96–106)
Creatinine, Ser: 1.22 mg/dL (ref 0.76–1.27)
Glucose: 95 mg/dL (ref 70–99)
Potassium: 4.7 mmol/L (ref 3.5–5.2)
Sodium: 140 mmol/L (ref 134–144)
eGFR: 68 mL/min/{1.73_m2} (ref >59)

## 2023-03-14 ENCOUNTER — Ambulatory Visit: Payer: Medicaid Other | Admitting: Orthopedic Surgery

## 2023-03-14 DIAGNOSIS — M12811 Other specific arthropathies, not elsewhere classified, right shoulder: Secondary | ICD-10-CM | POA: Diagnosis not present

## 2023-03-14 NOTE — Progress Notes (Signed)
 Office Visit Note   Patient: Dustin [name]           Date of Birth: 10-19-1960           MRN: [id number] Visit Date: 03/14/2023 Requested by: Marcine Matar, MD [address] [place] [place],  [place] [address] PCP: Marcine Matar, MD  Subjective: Chief Complaint  Patient presents with   Right Shoulder - Pain    HPI: Dustin [name] is a 63 y.o. male who presents to the office reporting continued severe right shoulder pain as well as right knee pain.  Since he was last seen he has seen a dentist who did extract 1 tooth.  The history here gets a little murky.  More work needs to be done in terms of bone more gum work and tooth work.  He is currently on antibiotics for his tooth..                ROS: All systems reviewed are negative as they relate to the chief complaint within the history of present illness.  Patient denies fevers or chills.  Assessment & Plan: Visit Diagnoses:  1. Rotator cuff arthropathy of right shoulder     Plan: Impression severe right shoulder pain as well as knee pain.  The tooth dizziness is slowly getting resolved.  We can tentatively look at getting him on the schedule for sometime in April potentially.  Would like to get formal risk stratification from the dentist in terms of completing all dental work that needs to be done prior to elective joint replacement.  Follow-Up Instructions: No follow-ups on file.   Orders:  No orders of the defined types were placed in this encounter.  No orders of the defined types were placed in this encounter.     Procedures: No procedures performed   Clinical Data: No additional findings.  Objective: Vital Signs: There were no vitals taken for this visit.  Physical Exam:  Constitutional: Patient appears well-developed HEENT:  Head: Normocephalic Eyes:EOM are normal Neck: Normal range of motion Cardiovascular: Normal rate Pulmonary/chest: Effort normal Neurologic: Patient is alert Skin:  Skin is warm Psychiatric: Patient has normal mood and affect  Ortho Exam: Ortho examination unchanged from prior visit regarding the shoulder and the knee.  Patient does have painful range of motion in both joints.  Otherwise unchanged.  Specialty Comments:  No specialty comments available.  Imaging: No results found.   PMFS History: Patient Active Problem List   Diagnosis Date Noted   Impingement syndrome of right shoulder 11/20/2022   Synovitis of right knee 10/24/2022   Obesity (BMI 30.0-34.9) 03/10/2020   Influenza vaccination declined 11/11/2019   Polyarthritis 11/11/2019   HLD (hyperlipidemia)    Chronic cough 07/23/2019   Tobacco dependence 07/23/2019   Chest pain in adult 07/23/2019   OSA (obstructive sleep apnea) 05/15/2016   Neck pain 03/05/2016   Low back pain 03/05/2016   DDD (degenerative disc disease), lumbosacral 01/31/2016   Acute meniscal tear of left knee 07/05/2014   Left knee pain 06/16/2014   Essential hypertension 06/16/2014   Other headache syndrome 02/17/2014   Chronic pain syndrome 02/17/2014   Medication overuse headache 05/06/2013   Dental caries 04/06/2013   Headache 04/06/2013   Past Medical History:  Diagnosis Date   Arthritis    DDD (degenerative disc disease)    GERD (gastroesophageal reflux disease)    Gout    HLD (hyperlipidemia)    Hypertension  Family History  Problem Relation Age of Onset   Rheum arthritis Mother    Peripheral Artery Disease Mother    Cancer Father    Diabetes Father     Past Surgical History:  Procedure Laterality Date   ANTERIOR CRUCIATE LIGAMENT REPAIR     NECK SURGERY     Social History   Occupational History   Not on file  Tobacco Use   Smoking status: Every Day    Current packs/day: 0.50    Types: Cigarettes   Smokeless tobacco: Never  Vaping Use   Vaping status: Never Used  Substance and Sexual Activity   Alcohol use: Yes    Alcohol/week: 0.0 standard drinks of alcohol    Comment:  occasionally- "3 shots a month"   Drug use: No   Sexual activity: Not Currently    Partners: Female

## 2023-03-15 ENCOUNTER — Encounter: Payer: Self-pay | Admitting: Orthopedic Surgery

## 2023-04-10 ENCOUNTER — Other Ambulatory Visit: Payer: Self-pay

## 2023-04-15 ENCOUNTER — Other Ambulatory Visit: Payer: Self-pay

## 2023-05-01 ENCOUNTER — Encounter: Payer: Self-pay | Admitting: Internal Medicine

## 2023-05-07 ENCOUNTER — Other Ambulatory Visit: Payer: Self-pay

## 2023-05-08 ENCOUNTER — Ambulatory Visit (HOSPITAL_COMMUNITY): Admit: 2023-05-08 | Admitting: Orthopedic Surgery

## 2023-05-08 DIAGNOSIS — Z01818 Encounter for other preprocedural examination: Secondary | ICD-10-CM

## 2023-05-08 SURGERY — ARTHROPLASTY, SHOULDER, TOTAL, REVERSE
Anesthesia: General | Site: Shoulder | Laterality: Right

## 2023-05-09 ENCOUNTER — Other Ambulatory Visit: Payer: Self-pay

## 2023-05-13 ENCOUNTER — Other Ambulatory Visit: Payer: Self-pay

## 2023-05-15 ENCOUNTER — Other Ambulatory Visit: Payer: Self-pay

## 2023-05-15 ENCOUNTER — Other Ambulatory Visit: Payer: Self-pay | Admitting: Internal Medicine

## 2023-05-15 DIAGNOSIS — E782 Mixed hyperlipidemia: Secondary | ICD-10-CM

## 2023-05-15 MED ORDER — ROSUVASTATIN CALCIUM 20 MG PO TABS
20.0000 mg | ORAL_TABLET | Freq: Every day | ORAL | 1 refills | Status: AC
Start: 2023-05-15 — End: ?
  Filled 2023-05-15 – 2023-07-09 (×2): qty 90, 90d supply, fill #0

## 2023-05-16 ENCOUNTER — Other Ambulatory Visit: Payer: Self-pay

## 2023-05-26 ENCOUNTER — Other Ambulatory Visit: Payer: Self-pay

## 2023-05-27 ENCOUNTER — Telehealth: Payer: Self-pay | Admitting: *Deleted

## 2023-05-27 ENCOUNTER — Encounter

## 2023-05-27 NOTE — Telephone Encounter (Signed)
 Attempt to reach pt for pre-visit. LM with call back #.  Will attempt to reach again in 5 min due to no other # listed in profile  Second attempt to reach pt for pre-vist unsuccessful. LM with facility # for pt to call back. Instructed pt to call # given by end of the day and reschedule the pre-visit  with RN or the scheduled procedure will be canceled.

## 2023-05-28 ENCOUNTER — Other Ambulatory Visit: Payer: Self-pay

## 2023-05-30 ENCOUNTER — Encounter: Payer: Self-pay | Admitting: Internal Medicine

## 2023-06-10 ENCOUNTER — Encounter: Admitting: Internal Medicine

## 2023-06-20 ENCOUNTER — Ambulatory Visit: Payer: Medicaid Other | Admitting: Internal Medicine

## 2023-07-09 ENCOUNTER — Other Ambulatory Visit: Payer: Self-pay

## 2023-07-10 ENCOUNTER — Other Ambulatory Visit: Payer: Self-pay

## 2023-07-14 ENCOUNTER — Telehealth: Payer: Self-pay

## 2023-07-14 ENCOUNTER — Encounter

## 2023-07-14 NOTE — Telephone Encounter (Signed)
 Multiple telephone appointments attempted to complete pre-visit appointment, all unsuccessful.  All calls went straight to VM and VM box was full so RN was unable to leave a message.  Patient is not signed up for mychart so RN unable to send a [employer]. If patient does not call back by the end of the day today to reschedule his pre-visit appointment, his colonoscopy scheduled on 07/29/23 will be canceled.

## 2023-07-24 ENCOUNTER — Other Ambulatory Visit: Payer: Self-pay

## 2023-07-25 ENCOUNTER — Other Ambulatory Visit: Payer: Self-pay

## 2023-07-29 ENCOUNTER — Encounter: Admitting: Internal Medicine

## 2023-08-04 ENCOUNTER — Telehealth: Payer: Self-pay | Admitting: Internal Medicine

## 2023-08-04 NOTE — Telephone Encounter (Signed)
 Called pt to confirm appt for 7/29 mail box full cannot accept msg at this time

## 2023-08-05 ENCOUNTER — Ambulatory Visit: Admitting: Internal Medicine
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
## 2023-10-19 ENCOUNTER — Encounter: Payer: Self-pay | Admitting: Internal Medicine
